# Patient Record
Sex: Male | Born: 1935 | Race: White | Hispanic: No | Marital: Married | Smoking: Former smoker
Health system: Southern US, Community
[De-identification: ages and names within clinical notes are randomized; demographics above are authoritative.]

## PROBLEM LIST (undated history)

## (undated) DIAGNOSIS — Z972 Presence of dental prosthetic device (complete) (partial): Secondary | ICD-10-CM

## (undated) DIAGNOSIS — N189 Chronic kidney disease, unspecified: Secondary | ICD-10-CM

## (undated) DIAGNOSIS — M810 Age-related osteoporosis without current pathological fracture: Secondary | ICD-10-CM

## (undated) DIAGNOSIS — J189 Pneumonia, unspecified organism: Secondary | ICD-10-CM

## (undated) DIAGNOSIS — D649 Anemia, unspecified: Secondary | ICD-10-CM

## (undated) DIAGNOSIS — E119 Type 2 diabetes mellitus without complications: Secondary | ICD-10-CM

## (undated) DIAGNOSIS — I2510 Atherosclerotic heart disease of native coronary artery without angina pectoris: Secondary | ICD-10-CM

## (undated) DIAGNOSIS — H409 Unspecified glaucoma: Secondary | ICD-10-CM

## (undated) DIAGNOSIS — G4730 Sleep apnea, unspecified: Secondary | ICD-10-CM

## (undated) DIAGNOSIS — I499 Cardiac arrhythmia, unspecified: Secondary | ICD-10-CM

## (undated) DIAGNOSIS — L570 Actinic keratosis: Secondary | ICD-10-CM

## (undated) DIAGNOSIS — E039 Hypothyroidism, unspecified: Secondary | ICD-10-CM

## (undated) HISTORY — DX: Actinic keratosis: L57.0

## (undated) HISTORY — PX: APPENDECTOMY: SHX54

## (undated) HISTORY — PX: BACK SURGERY: SHX140

## (undated) HISTORY — PX: HERNIA REPAIR: SHX51

---

## 2004-03-12 HISTORY — PX: CARDIAC CATHETERIZATION: SHX172

## 2004-04-06 ENCOUNTER — Emergency Department: Payer: Self-pay | Admitting: Emergency Medicine

## 2005-05-17 ENCOUNTER — Ambulatory Visit: Payer: Self-pay

## 2005-06-10 ENCOUNTER — Ambulatory Visit: Payer: Self-pay

## 2005-08-13 ENCOUNTER — Ambulatory Visit: Payer: Self-pay | Admitting: Gastroenterology

## 2005-08-29 ENCOUNTER — Ambulatory Visit: Payer: Self-pay | Admitting: Urology

## 2005-08-31 ENCOUNTER — Ambulatory Visit: Payer: Self-pay | Admitting: Internal Medicine

## 2005-09-14 ENCOUNTER — Ambulatory Visit: Payer: Self-pay | Admitting: Internal Medicine

## 2005-09-29 ENCOUNTER — Ambulatory Visit: Payer: Self-pay | Admitting: Internal Medicine

## 2006-02-26 ENCOUNTER — Ambulatory Visit: Payer: Self-pay | Admitting: Internal Medicine

## 2006-03-12 ENCOUNTER — Ambulatory Visit: Payer: Self-pay | Admitting: Internal Medicine

## 2006-04-12 ENCOUNTER — Ambulatory Visit: Payer: Self-pay | Admitting: Internal Medicine

## 2006-05-11 ENCOUNTER — Ambulatory Visit: Payer: Self-pay | Admitting: Internal Medicine

## 2006-07-11 ENCOUNTER — Ambulatory Visit: Payer: Self-pay | Admitting: Internal Medicine

## 2006-08-03 ENCOUNTER — Other Ambulatory Visit: Payer: Self-pay

## 2006-08-03 ENCOUNTER — Inpatient Hospital Stay: Payer: Self-pay | Admitting: Internal Medicine

## 2006-09-25 ENCOUNTER — Ambulatory Visit: Payer: Self-pay | Admitting: Ophthalmology

## 2007-09-01 ENCOUNTER — Ambulatory Visit: Payer: Self-pay | Admitting: Urology

## 2009-11-11 ENCOUNTER — Encounter: Admission: RE | Admit: 2009-11-11 | Discharge: 2009-11-11 | Payer: Self-pay | Admitting: Orthopedic Surgery

## 2011-01-16 ENCOUNTER — Ambulatory Visit: Payer: Self-pay | Admitting: Gastroenterology

## 2011-06-14 ENCOUNTER — Ambulatory Visit: Payer: Self-pay

## 2013-05-25 ENCOUNTER — Ambulatory Visit: Payer: Self-pay

## 2013-05-25 LAB — COMPREHENSIVE METABOLIC PANEL
ALK PHOS: 72 U/L
Albumin: 3.9 g/dL (ref 3.4–5.0)
Anion Gap: 10 (ref 7–16)
BUN: 21 mg/dL — AB (ref 7–18)
Bilirubin,Total: 0.6 mg/dL (ref 0.2–1.0)
CALCIUM: 9.5 mg/dL (ref 8.5–10.1)
CO2: 25 mmol/L (ref 21–32)
Chloride: 99 mmol/L (ref 98–107)
Creatinine: 1.51 mg/dL — ABNORMAL HIGH (ref 0.60–1.30)
EGFR (Non-African Amer.): 43 — ABNORMAL LOW
GFR CALC AF AMER: 50 — AB
GLUCOSE: 388 mg/dL — AB (ref 65–99)
Osmolality: 287 (ref 275–301)
POTASSIUM: 5.5 mmol/L — AB (ref 3.5–5.1)
SGOT(AST): 20 U/L (ref 15–37)
SGPT (ALT): 20 U/L (ref 12–78)
Sodium: 134 mmol/L — ABNORMAL LOW (ref 136–145)
TOTAL PROTEIN: 7.2 g/dL (ref 6.4–8.2)

## 2013-05-25 LAB — CBC WITH DIFFERENTIAL/PLATELET
BASOS PCT: 0.4 %
Basophil #: 0 10*3/uL (ref 0.0–0.1)
EOS ABS: 0 10*3/uL (ref 0.0–0.7)
Eosinophil %: 0.1 %
HCT: 41.5 % (ref 40.0–52.0)
HGB: 13.8 g/dL (ref 13.0–18.0)
LYMPHS ABS: 1.1 10*3/uL (ref 1.0–3.6)
LYMPHS PCT: 12.5 %
MCH: 30.4 pg (ref 26.0–34.0)
MCHC: 33.3 g/dL (ref 32.0–36.0)
MCV: 91 fL (ref 80–100)
MONO ABS: 0.5 x10 3/mm (ref 0.2–1.0)
Monocyte %: 5.7 %
Neutrophil #: 7.3 10*3/uL — ABNORMAL HIGH (ref 1.4–6.5)
Neutrophil %: 81.3 %
PLATELETS: 205 10*3/uL (ref 150–440)
RBC: 4.54 10*6/uL (ref 4.40–5.90)
RDW: 13.9 % (ref 11.5–14.5)
WBC: 9 10*3/uL (ref 3.8–10.6)

## 2016-12-11 ENCOUNTER — Encounter: Payer: Self-pay | Admitting: *Deleted

## 2016-12-17 NOTE — Discharge Instructions (Signed)

## 2016-12-19 ENCOUNTER — Encounter
Admission: RE | Disposition: A | Discharge status: Home / Self Care | Payer: Self-pay | Source: Ambulatory Visit | Attending: Ophthalmology

## 2016-12-19 ENCOUNTER — Ambulatory Visit
Admission: RE | Admit: 2016-12-19 | Discharge: 2016-12-19 | Disposition: A | Discharge status: Home / Self Care | Payer: Medicare PPO | Source: Ambulatory Visit | Attending: Ophthalmology | Admitting: Ophthalmology

## 2016-12-19 ENCOUNTER — Ambulatory Visit: Payer: Medicare PPO | Admitting: Anesthesiology

## 2016-12-19 DIAGNOSIS — G4730 Sleep apnea, unspecified: Secondary | ICD-10-CM | POA: Insufficient documentation

## 2016-12-19 DIAGNOSIS — Z955 Presence of coronary angioplasty implant and graft: Secondary | ICD-10-CM | POA: Insufficient documentation

## 2016-12-19 DIAGNOSIS — E1136 Type 2 diabetes mellitus with diabetic cataract: Secondary | ICD-10-CM | POA: Diagnosis not present

## 2016-12-19 DIAGNOSIS — H2512 Age-related nuclear cataract, left eye: Secondary | ICD-10-CM | POA: Insufficient documentation

## 2016-12-19 DIAGNOSIS — E079 Disorder of thyroid, unspecified: Secondary | ICD-10-CM | POA: Diagnosis not present

## 2016-12-19 DIAGNOSIS — H401110 Primary open-angle glaucoma, right eye, stage unspecified: Secondary | ICD-10-CM | POA: Diagnosis not present

## 2016-12-19 DIAGNOSIS — Z87891 Personal history of nicotine dependence: Secondary | ICD-10-CM | POA: Diagnosis not present

## 2016-12-19 DIAGNOSIS — D649 Anemia, unspecified: Secondary | ICD-10-CM | POA: Diagnosis not present

## 2016-12-19 DIAGNOSIS — Z9849 Cataract extraction status, unspecified eye: Secondary | ICD-10-CM | POA: Diagnosis not present

## 2016-12-19 HISTORY — DX: Presence of dental prosthetic device (complete) (partial): Z97.2

## 2016-12-19 HISTORY — DX: Age-related osteoporosis without current pathological fracture: M81.0

## 2016-12-19 HISTORY — DX: Anemia, unspecified: D64.9

## 2016-12-19 HISTORY — DX: Sleep apnea, unspecified: G47.30

## 2016-12-19 HISTORY — DX: Chronic kidney disease, unspecified: N18.9

## 2016-12-19 HISTORY — DX: Type 2 diabetes mellitus without complications: E11.9

## 2016-12-19 HISTORY — DX: Hypothyroidism, unspecified: E03.9

## 2016-12-19 HISTORY — PX: CATARACT EXTRACTION W/PHACO: SHX586

## 2016-12-19 HISTORY — PX: TRABECULECTOMY: SHX107

## 2016-12-19 LAB — GLUCOSE, CAPILLARY
Glucose-Capillary: 102 mg/dL — ABNORMAL HIGH (ref 65–99)
Glucose-Capillary: 125 mg/dL — ABNORMAL HIGH (ref 65–99)

## 2016-12-19 SURGERY — PHACOEMULSIFICATION, CATARACT, WITH IOL INSERTION
Anesthesia: Monitor Anesthesia Care | Laterality: Right | Wound class: Clean

## 2016-12-19 MED ORDER — LACTATED RINGERS IV SOLN
1000.0000 mL | INTRAVENOUS | Status: DC
Start: 2016-12-19 — End: 2016-12-19

## 2016-12-19 MED ORDER — MOXIFLOXACIN HCL 0.5 % OP SOLN
1.0000 [drp] | OPHTHALMIC | Status: DC | PRN
  Administered 2016-12-19 (×3): 1 [drp] via OPHTHALMIC

## 2016-12-19 MED ORDER — NA HYALUR & NA CHOND-NA HYALUR 0.4-0.35 ML IO KIT
PACK | INTRAOCULAR | Status: DC | PRN
Start: 2016-12-19 — End: 2016-12-19
  Administered 2016-12-19: 1 mL via INTRAOCULAR

## 2016-12-19 MED ORDER — LIDOCAINE HCL (PF) 4 % IJ SOLN
INTRAMUSCULAR | Status: DC | PRN
  Administered 2016-12-19: 5 mL via OPHTHALMIC

## 2016-12-19 MED ORDER — LIDOCAINE HCL (PF) 2 % IJ SOLN
INTRAOCULAR | Status: DC | PRN
  Administered 2016-12-19: 09:00:00 via INTRAMUSCULAR

## 2016-12-19 MED ORDER — ARMC OPHTHALMIC DILATING DROPS
1.0000 "application " | OPHTHALMIC | Status: DC | PRN
  Administered 2016-12-19 (×3): 1 via OPHTHALMIC

## 2016-12-19 MED ORDER — MITOMYCIN 0.2 MG OP KIT
PACK | OPHTHALMIC | Status: DC | PRN
  Administered 2016-12-19: .4 mL via OPHTHALMIC

## 2016-12-19 MED ORDER — BSS IO SOLN
INTRAOCULAR | Status: DC | PRN
  Administered 2016-12-19: 30 mL

## 2016-12-19 MED ORDER — MIDAZOLAM HCL 2 MG/2ML IJ SOLN
INTRAMUSCULAR | Status: DC | PRN
  Administered 2016-12-19: 1 mg via INTRAVENOUS

## 2016-12-19 MED ORDER — ALFENTANIL 500 MCG/ML IJ INJ
INJECTION | INTRAVENOUS | Status: DC | PRN
  Administered 2016-12-19: 400 ug via INTRAVENOUS

## 2016-12-19 MED ORDER — NEOMYCIN-POLYMYXIN-DEXAMETH 3.5-10000-0.1 OP OINT
TOPICAL_OINTMENT | OPHTHALMIC | Status: DC | PRN
Start: 2016-12-19 — End: 2016-12-19
  Administered 2016-12-19: 1 via OPHTHALMIC

## 2016-12-19 SURGICAL SUPPLY — 35 items
BANDAGE EYE OVAL (MISCELLANEOUS) ×6 IMPLANT
BLADE MINI RND TIP GREEN BEAV (BLADE) ×3 IMPLANT
CANNULA ANT/CHMB 27GA (MISCELLANEOUS) ×3 IMPLANT
CARTRIDGE ABBOTT (MISCELLANEOUS) IMPLANT
CORD BIP STRL DISP 12FT (MISCELLANEOUS) ×3 IMPLANT
CUP MEDICINE 2OZ PLAST GRAD ST (MISCELLANEOUS) ×3 IMPLANT
GLOVE BIO SURGEON STRL SZ7.5 (GLOVE) ×3 IMPLANT
GLOVE SURG LX 7.5 STRW (GLOVE) ×2
GLOVE SURG LX STRL 7.5 STRW (GLOVE) ×1 IMPLANT
GLOVE SURG TRIUMPH 8.0 PF LTX (GLOVE) ×3 IMPLANT
GOWN STRL REUS W/ TWL LRG LVL3 (GOWN DISPOSABLE) ×2 IMPLANT
GOWN STRL REUS W/TWL LRG LVL3 (GOWN DISPOSABLE) ×4
LENS IOL ACRYSOF IQ 20.0 (Intraocular Lens) ×3 IMPLANT
MARKER SKIN DUAL TIP RULER LAB (MISCELLANEOUS) ×3 IMPLANT
NDL RETROBULBAR .5 NSTRL (NEEDLE) ×3 IMPLANT
NEEDLE FILTER BLUNT 18X 1/2SAF (NEEDLE) ×2
NEEDLE FILTER BLUNT 18X1 1/2 (NEEDLE) ×1 IMPLANT
NEEDLE HYPO 26X3/8 (NEEDLE) ×3 IMPLANT
PACK CATARACT BRASINGTON (MISCELLANEOUS) ×3 IMPLANT
PACK EYE AFTER SURG (MISCELLANEOUS) ×3 IMPLANT
PACK OPTHALMIC (MISCELLANEOUS) ×3 IMPLANT
PROTECTOR LASIK FLAP (MISCELLANEOUS) ×3 IMPLANT
RING MALYGIN 7.0 (MISCELLANEOUS) ×3 IMPLANT
SHUNT EXPRESS GLAUCOMA MINI (Shunt) ×3 IMPLANT
SPONGE SURG I SPEAR (MISCELLANEOUS) ×6 IMPLANT
SUT ETHILON 10-0 CS-B-6CS-B-6 (SUTURE) ×3
SUT VICRYL  9 0 (SUTURE) ×2
SUT VICRYL 9 0 (SUTURE) ×1 IMPLANT
SUTURE EHLN 10-0 CS-B-6CS-B-6 (SUTURE) ×1 IMPLANT
SYR 3ML LL SCALE MARK (SYRINGE) ×3 IMPLANT
SYR TB 1ML LUER SLIP (SYRINGE) ×3 IMPLANT
SYRINGE 10CC LL (SYRINGE) ×3 IMPLANT
WATER STERILE IRR 250ML POUR (IV SOLUTION) ×3 IMPLANT
WATER STERILE IRR 500ML POUR (IV SOLUTION) ×3 IMPLANT
WIPE NON LINTING 3.25X3.25 (MISCELLANEOUS) ×3 IMPLANT

## 2016-12-19 NOTE — Transfer of Care (Signed)
Immediate Anesthesia Transfer of Care Note  Patient: Logan Strong  Procedure(s) Performed: CATARACT EXTRACTION PHACO AND INTRAOCULAR LENS PLACEMENT (IOC) RIGHT DIABETIC (Right ) TRABECULECTOMY WITH [facility] AND EXPRESS SHUNT (Right )  Patient Location: PACU  Anesthesia Type: MAC  Level of Consciousness: awake, alert  and patient cooperative  Airway and Oxygen Therapy: Patient Spontanous Breathing and Patient connected to supplemental oxygen  Post-op Assessment: Post-op Vital signs reviewed, Patient's Cardiovascular Status Stable, Respiratory Function Stable, Patent Airway and No signs of Nausea or vomiting  Post-op Vital Signs: Reviewed and stable  Complications: No apparent anesthesia complications

## 2016-12-19 NOTE — Anesthesia Procedure Notes (Signed)
Procedure Name: MAC Performed by: Kalen Neidert Pre-anesthesia Checklist: Patient identified, Emergency Drugs available, Suction available, Timeout performed and Patient being monitored Patient Re-evaluated:Patient Re-evaluated prior to inductionOxygen Delivery Method: Nasal cannula Placement Confirmation: positive ETCO2       

## 2016-12-19 NOTE — Anesthesia Postprocedure Evaluation (Signed)
Anesthesia Post Note  Patient: Logan Strong  Procedure(s) Performed: CATARACT EXTRACTION PHACO AND INTRAOCULAR LENS PLACEMENT (IOC) RIGHT DIABETIC (Right ) TRABECULECTOMY WITH [facility] AND EXPRESS SHUNT (Right )  Patient location during evaluation: PACU Anesthesia Type: MAC Level of consciousness: awake and alert Pain management: pain level controlled Vital Signs Assessment: post-procedure vital signs reviewed and stable Respiratory status: spontaneous breathing, nonlabored ventilation, respiratory function stable and patient connected to nasal cannula oxygen Cardiovascular status: stable and blood pressure returned to baseline Postop Assessment: no apparent nausea or vomiting Anesthetic complications: no    DANIEL D KOVACS

## 2016-12-19 NOTE — Anesthesia Preprocedure Evaluation (Signed)
Anesthesia Evaluation  Patient identified by MRN, date of birth, ID band Patient awake    Reviewed: Allergy & Precautions, H&P , NPO status , Patient's Chart, lab work & pertinent test results, reviewed documented beta blocker date and time   Airway Mallampati: II  TM Distance: >3 FB Neck ROM: full    Dental no notable dental hx.    Pulmonary sleep apnea and Continuous Positive Airway Pressure Ventilation , former smoker,    Pulmonary exam normal breath sounds clear to auscultation       Cardiovascular Exercise Tolerance: Good negative cardio ROS   Rhythm:regular Rate:Normal     Neuro/Psych negative neurological ROS  negative psych ROS   GI/Hepatic negative GI ROS, Neg liver ROS,   Endo/Other  diabetes, Type 2, Insulin DependentHypothyroidism   Renal/GU CRFRenal disease  negative genitourinary   Musculoskeletal   Abdominal   Peds  Hematology negative hematology ROS (+) anemia ,   Anesthesia Other Findings   Reproductive/Obstetrics negative OB ROS                             Anesthesia Physical Anesthesia Plan  ASA: III  Anesthesia Plan: MAC   Post-op Pain Management:    Induction:   PONV Risk Score and Plan:   Airway Management Planned:   Additional Equipment:   Intra-op Plan:   Post-operative Plan:   Informed Consent: I have reviewed the patients History and Physical, chart, labs and discussed the procedure including the risks, benefits and alternatives for the proposed anesthesia with the patient or authorized representative who has indicated his/her understanding and acceptance.     Plan Discussed with: CRNA  Anesthesia Plan Comments:         Anesthesia Quick Evaluation

## 2016-12-19 NOTE — Op Note (Signed)
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  X52.8413   Nuclear sclerotic cataract left eye. 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     Nuclear sclerotic cataract left eye. H25.11    PROCEDURE PERFORMED: Trabeculectomy with mitomycin C and placement of Express shunt right eye. Phacoemusification with posterior chamber intraocular lens placement of the right eye     IMPLANT:   Implant Name Type Inv. Item Serial No. Manufacturer Lot No. LRB No. Used  SHUNT EXPRESS GLAUCOMA MINI - [id number] Shunt SHUNT EXPRESS GLAUCOMA MINI [id number] ALCON [id number] Right 1  LENS IOL ACRYSOF IQ 20.0 - [id number] Intraocular Lens LENS IOL ACRYSOF IQ 20.0 [id number] ALCON   Right 1    ULTRASOUND TIME: 16  % of 1 minutes 24 seconds, CDE 13.8  SURGEON:  Deirdre Evener, MD   ANESTHESIA: Retrobulbar block of Xylocaine and bupivacaine administered under intravenous sedation plus 1% preservative-free intracameral lidocaine.  ESTIMATED BLOOD LOSS: Less than 1 mL.   COMPLICATIONS: None.   DESCRIPTION OF PROCEDURE: The patient was identified in the holding room and transported to the operative suite and placed in the supine position underneath the operating microscope. The right eye was identified as the operative eye and a retrobulbar block and lid block of Xylocaine and bupivacaine were administered under intravenous sedation. It was then prepped and draped in the usual sterile ophthalmic fashion.   A 1 millimeter clear-corneal paracentesis was made at the 12:00 position.  0.5 ml of preservative-free 1% lidocaine was injected into the anterior chamber.  The anterior chamber was filled with Viscoat viscoelastic.  A 2.4 millimeter keratome was used to make a near-clear corneal incision at the 9:00 position.  .  A curvilinear capsulorrhexis was made with a cystotome and  capsulorrhexis forceps.  Balanced salt solution was used to hydrodissect and hydrodelineate the nucleus.   Phacoemulsification was then used in stop and chop fashion to remove the lens nucleus and epinucleus.  The remaining cortex was then removed using the irrigation and aspiration handpiece. Provisc was then placed into the capsular bag to distend it for lens placement.  A lens was then injected into the capsular bag.  The remaining viscoelastic was aspirated.   Wounds were hydrated with balanced salt solution.  The anterior chamber was inflated to a physiologic pressure with balanced salt solution.  No wound leaks were noted.  A 10-0 nylon suture was placed through the 2.4 mm incision.   Mitomycin 0.04% mixed with 2% lidocaine was injected subconjunctivally.  A conjunctival peritomy was made from the 12 o'clock to the 2 o'clock position with Westcott scissors approximately 2 mm posterior to the limbus. Hemostasis was achieved with Wet-Field cautery. This area was copiously irrigated with balanced salt solution.  Careful dissection of the tenons and conjunctiva were done using Westcott scissors.  A partial-thickness trapezoidal-shaped scleral flap was made at the 1 o'clock position. This was dissected forward to clear cornea. A 26-gauge needle was then used to enter the anterior chamber parallel to the iris underneath the anterior portion of the scleral flap. An Express shunt version P50 was then placed through the opening into the anterior chamber. This was noted to be parallel with the iris without corneal or iris touch. The flap was sutured initially with two posterior 10-0 nylon sutures. The anterior chamber was filled with balanced salt solution to remove the remaining Provisc. An additional 3 10-0 nylon  sutures were placed on the sides of the flap. There was adequate but not excess spontaneous flow from the flap. The conjunctiva was then closed with running 9-0 Vicryl suture.   The paracentesis  incision was hydrated with balanced salt solution to ensure a tight wound without wound leak. Cefuroxime 0.1 ml of a /ml solution was injected into the anterior chamber for a dose of 1 mg of intracameral antibiotic at the completion of the case. The eye was inflated above physiologic pressure. Spontaneous bleb formation was noted. There was no conjunctival wound leak with bleb formation. Topical  erythromycin ointment was applied to the eye. The eye was patched and shielded. The patient was taken to the recovery room in stable condition.  Conor Lata 12/19/2016, 9:04 AM

## 2016-12-19 NOTE — H&P (Signed)
The History and Physical notes are on paper, have been signed, and are to be scanned. The patient remains stable and unchanged from the H&P.   Previous H&P reviewed, patient examined, and there are no changes.  Logan Strong 12/19/2016 7:40 AM

## 2016-12-20 ENCOUNTER — Encounter: Payer: Self-pay | Admitting: Ophthalmology

## 2017-03-22 DIAGNOSIS — E113293 Type 2 diabetes mellitus with mild nonproliferative diabetic retinopathy without macular edema, bilateral: Secondary | ICD-10-CM | POA: Diagnosis not present

## 2017-03-27 DIAGNOSIS — L570 Actinic keratosis: Secondary | ICD-10-CM | POA: Diagnosis not present

## 2017-03-27 DIAGNOSIS — L578 Other skin changes due to chronic exposure to nonionizing radiation: Secondary | ICD-10-CM | POA: Diagnosis not present

## 2017-03-27 DIAGNOSIS — L820 Inflamed seborrheic keratosis: Secondary | ICD-10-CM | POA: Diagnosis not present

## 2017-04-08 DIAGNOSIS — E103493 Type 1 diabetes mellitus with severe nonproliferative diabetic retinopathy without macular edema, bilateral: Secondary | ICD-10-CM | POA: Diagnosis not present

## 2017-04-08 DIAGNOSIS — E034 Atrophy of thyroid (acquired): Secondary | ICD-10-CM | POA: Diagnosis not present

## 2017-04-08 DIAGNOSIS — M810 Age-related osteoporosis without current pathological fracture: Secondary | ICD-10-CM | POA: Diagnosis not present

## 2017-04-08 DIAGNOSIS — Z9641 Presence of insulin pump (external) (internal): Secondary | ICD-10-CM | POA: Diagnosis not present

## 2017-04-08 DIAGNOSIS — D510 Vitamin B12 deficiency anemia due to intrinsic factor deficiency: Secondary | ICD-10-CM | POA: Diagnosis not present

## 2017-04-08 DIAGNOSIS — E10649 Type 1 diabetes mellitus with hypoglycemia without coma: Secondary | ICD-10-CM | POA: Diagnosis not present

## 2017-04-17 DIAGNOSIS — I10 Essential (primary) hypertension: Secondary | ICD-10-CM | POA: Diagnosis not present

## 2017-04-26 DIAGNOSIS — E7800 Pure hypercholesterolemia, unspecified: Secondary | ICD-10-CM | POA: Diagnosis not present

## 2017-04-26 DIAGNOSIS — N183 Chronic kidney disease, stage 3 (moderate): Secondary | ICD-10-CM | POA: Diagnosis not present

## 2017-04-26 DIAGNOSIS — I10 Essential (primary) hypertension: Secondary | ICD-10-CM | POA: Diagnosis not present

## 2017-04-26 DIAGNOSIS — E034 Atrophy of thyroid (acquired): Secondary | ICD-10-CM | POA: Diagnosis not present

## 2017-05-15 DIAGNOSIS — H409 Unspecified glaucoma: Secondary | ICD-10-CM | POA: Diagnosis not present

## 2017-05-15 DIAGNOSIS — Z6821 Body mass index (BMI) 21.0-21.9, adult: Secondary | ICD-10-CM | POA: Diagnosis not present

## 2017-05-15 DIAGNOSIS — E039 Hypothyroidism, unspecified: Secondary | ICD-10-CM | POA: Diagnosis not present

## 2017-05-15 DIAGNOSIS — M810 Age-related osteoporosis without current pathological fracture: Secondary | ICD-10-CM | POA: Diagnosis not present

## 2017-05-15 DIAGNOSIS — E785 Hyperlipidemia, unspecified: Secondary | ICD-10-CM | POA: Diagnosis not present

## 2017-05-15 DIAGNOSIS — I252 Old myocardial infarction: Secondary | ICD-10-CM | POA: Diagnosis not present

## 2017-05-15 DIAGNOSIS — E1022 Type 1 diabetes mellitus with diabetic chronic kidney disease: Secondary | ICD-10-CM | POA: Diagnosis not present

## 2017-05-15 DIAGNOSIS — N189 Chronic kidney disease, unspecified: Secondary | ICD-10-CM | POA: Diagnosis not present

## 2017-05-15 DIAGNOSIS — I129 Hypertensive chronic kidney disease with stage 1 through stage 4 chronic kidney disease, or unspecified chronic kidney disease: Secondary | ICD-10-CM | POA: Diagnosis not present

## 2017-05-31 DIAGNOSIS — Z4681 Encounter for fitting and adjustment of insulin pump: Secondary | ICD-10-CM | POA: Diagnosis not present

## 2017-05-31 DIAGNOSIS — Z9641 Presence of insulin pump (external) (internal): Secondary | ICD-10-CM | POA: Diagnosis not present

## 2017-05-31 DIAGNOSIS — Z7189 Other specified counseling: Secondary | ICD-10-CM | POA: Diagnosis not present

## 2017-05-31 DIAGNOSIS — E108 Type 1 diabetes mellitus with unspecified complications: Secondary | ICD-10-CM | POA: Diagnosis not present

## 2017-06-05 DIAGNOSIS — H6123 Impacted cerumen, bilateral: Secondary | ICD-10-CM | POA: Diagnosis not present

## 2017-06-05 DIAGNOSIS — H93299 Other abnormal auditory perceptions, unspecified ear: Secondary | ICD-10-CM | POA: Diagnosis not present

## 2017-06-07 ENCOUNTER — Other Ambulatory Visit: Payer: Self-pay

## 2017-06-07 ENCOUNTER — Inpatient Hospital Stay
Admission: EM | Admit: 2017-06-07 | Discharge: 2017-06-08 | DRG: 194 | Disposition: A | Discharge status: Home / Self Care | Payer: Medicare Other | Attending: Internal Medicine | Admitting: Internal Medicine

## 2017-06-07 ENCOUNTER — Encounter: Payer: Self-pay | Admitting: Emergency Medicine

## 2017-06-07 ENCOUNTER — Emergency Department: Payer: Medicare Other

## 2017-06-07 DIAGNOSIS — N183 Chronic kidney disease, stage 3 (moderate): Secondary | ICD-10-CM | POA: Diagnosis present

## 2017-06-07 DIAGNOSIS — M810 Age-related osteoporosis without current pathological fracture: Secondary | ICD-10-CM | POA: Diagnosis not present

## 2017-06-07 DIAGNOSIS — Z79890 Hormone replacement therapy (postmenopausal): Secondary | ICD-10-CM | POA: Diagnosis not present

## 2017-06-07 DIAGNOSIS — Z87891 Personal history of nicotine dependence: Secondary | ICD-10-CM

## 2017-06-07 DIAGNOSIS — Z794 Long term (current) use of insulin: Secondary | ICD-10-CM

## 2017-06-07 DIAGNOSIS — Z9641 Presence of insulin pump (external) (internal): Secondary | ICD-10-CM | POA: Diagnosis not present

## 2017-06-07 DIAGNOSIS — J181 Lobar pneumonia, unspecified organism: Principal | ICD-10-CM | POA: Diagnosis present

## 2017-06-07 DIAGNOSIS — Z7982 Long term (current) use of aspirin: Secondary | ICD-10-CM

## 2017-06-07 DIAGNOSIS — E785 Hyperlipidemia, unspecified: Secondary | ICD-10-CM | POA: Diagnosis not present

## 2017-06-07 DIAGNOSIS — R5383 Other fatigue: Secondary | ICD-10-CM | POA: Diagnosis not present

## 2017-06-07 DIAGNOSIS — E1122 Type 2 diabetes mellitus with diabetic chronic kidney disease: Secondary | ICD-10-CM | POA: Diagnosis not present

## 2017-06-07 DIAGNOSIS — E039 Hypothyroidism, unspecified: Secondary | ICD-10-CM | POA: Diagnosis present

## 2017-06-07 DIAGNOSIS — G4730 Sleep apnea, unspecified: Secondary | ICD-10-CM | POA: Diagnosis present

## 2017-06-07 DIAGNOSIS — N179 Acute kidney failure, unspecified: Secondary | ICD-10-CM | POA: Diagnosis present

## 2017-06-07 DIAGNOSIS — Z79899 Other long term (current) drug therapy: Secondary | ICD-10-CM | POA: Diagnosis not present

## 2017-06-07 DIAGNOSIS — I10 Essential (primary) hypertension: Secondary | ICD-10-CM | POA: Diagnosis not present

## 2017-06-07 DIAGNOSIS — J189 Pneumonia, unspecified organism: Secondary | ICD-10-CM | POA: Diagnosis present

## 2017-06-07 DIAGNOSIS — R079 Chest pain, unspecified: Secondary | ICD-10-CM | POA: Diagnosis not present

## 2017-06-07 DIAGNOSIS — E119 Type 2 diabetes mellitus without complications: Secondary | ICD-10-CM | POA: Diagnosis not present

## 2017-06-07 DIAGNOSIS — R0789 Other chest pain: Secondary | ICD-10-CM | POA: Diagnosis not present

## 2017-06-07 DIAGNOSIS — I129 Hypertensive chronic kidney disease with stage 1 through stage 4 chronic kidney disease, or unspecified chronic kidney disease: Secondary | ICD-10-CM | POA: Diagnosis not present

## 2017-06-07 LAB — BASIC METABOLIC PANEL
ANION GAP: 13 (ref 5–15)
BUN: 43 mg/dL — ABNORMAL HIGH (ref 6–20)
CHLORIDE: 102 mmol/L (ref 101–111)
CO2: 17 mmol/L — AB (ref 22–32)
Calcium: 8.7 mg/dL — ABNORMAL LOW (ref 8.9–10.3)
Creatinine, Ser: 1.79 mg/dL — ABNORMAL HIGH (ref 0.61–1.24)
GFR calc non Af Amer: 33 mL/min — ABNORMAL LOW (ref >60)
GFR, EST AFRICAN AMERICAN: 39 mL/min — AB (ref >60)
Glucose, Bld: 374 mg/dL — ABNORMAL HIGH (ref 65–99)
POTASSIUM: 4.7 mmol/L (ref 3.5–5.1)
SODIUM: 132 mmol/L — AB (ref 135–145)

## 2017-06-07 LAB — CBC
HEMATOCRIT: 34.8 % — AB (ref 40.0–52.0)
HEMOGLOBIN: 11.7 g/dL — AB (ref 13.0–18.0)
MCH: 30.5 pg (ref 26.0–34.0)
MCHC: 33.6 g/dL (ref 32.0–36.0)
MCV: 90.7 fL (ref 80.0–100.0)
Platelets: 188 10*3/uL (ref 150–440)
RBC: 3.83 MIL/uL — AB (ref 4.40–5.90)
RDW: 14.3 % (ref 11.5–14.5)
WBC: 7.9 10*3/uL (ref 3.8–10.6)

## 2017-06-07 LAB — TROPONIN I: Troponin I: <0.03 ng/mL (ref <0.03)

## 2017-06-07 LAB — LACTIC ACID, PLASMA: Lactic Acid, Venous: 1.1 mmol/L (ref 0.5–1.9)

## 2017-06-07 LAB — GLUCOSE, CAPILLARY: GLUCOSE-CAPILLARY: 309 mg/dL — AB (ref 65–99)

## 2017-06-07 MED ORDER — DORZOLAMIDE HCL 2 % OP SOLN
1.0000 [drp] | Freq: Two times a day (BID) | OPHTHALMIC | Status: DC
  Filled 2017-06-07: qty 10

## 2017-06-07 MED ORDER — ACETAMINOPHEN 325 MG PO TABS: 650.0000 mg | ORAL_TABLET | Freq: Four times a day (QID) | ORAL | Status: DC | PRN

## 2017-06-07 MED ORDER — LEVOFLOXACIN IN D5W 750 MG/150ML IV SOLN: 750.0000 mg | INTRAVENOUS | Status: DC

## 2017-06-07 MED ORDER — SIMVASTATIN 20 MG PO TABS: 40.0000 mg | ORAL_TABLET | Freq: Every day | ORAL | Status: DC

## 2017-06-07 MED ORDER — ENOXAPARIN SODIUM 40 MG/0.4ML ~~LOC~~ SOLN
30.0000 mg | SUBCUTANEOUS | Status: DC
  Filled 2017-06-07: qty 0.4

## 2017-06-07 MED ORDER — BRIMONIDINE TARTRATE 0.2 % OP SOLN
1.0000 [drp] | Freq: Two times a day (BID) | OPHTHALMIC | Status: DC
  Administered 2017-06-08: 1 [drp] via OPHTHALMIC
  Filled 2017-06-07: qty 5

## 2017-06-07 MED ORDER — POLYETHYLENE GLYCOL 3350 17 G PO PACK: 17.0000 g | PACK | Freq: Every day | ORAL | Status: DC | PRN

## 2017-06-07 MED ORDER — INSULIN ASPART 100 UNIT/ML ~~LOC~~ SOLN
0.0000 [IU] | Freq: Three times a day (TID) | SUBCUTANEOUS | Status: DC
Start: 2017-06-08 — End: 2017-06-08

## 2017-06-07 MED ORDER — INSULIN ASPART 100 UNIT/ML ~~LOC~~ SOLN: 0.0000 [IU] | Freq: Every day | SUBCUTANEOUS | Status: DC

## 2017-06-07 MED ORDER — SODIUM CHLORIDE 0.9 % IV SOLN
INTRAVENOUS | Status: AC
  Administered 2017-06-08: 01:00:00 via INTRAVENOUS

## 2017-06-07 MED ORDER — ONDANSETRON HCL 4 MG PO TABS: 4.0000 mg | ORAL_TABLET | Freq: Four times a day (QID) | ORAL | Status: DC | PRN

## 2017-06-07 MED ORDER — INSULIN PUMP
Freq: Three times a day (TID) | SUBCUTANEOUS | Status: DC
  Administered 2017-06-08 (×2): via SUBCUTANEOUS
  Filled 2017-06-07: qty 1

## 2017-06-07 MED ORDER — LISINOPRIL 5 MG PO TABS
5.0000 mg | ORAL_TABLET | Freq: Every day | ORAL | Status: DC
  Administered 2017-06-08: 09:00:00 5 mg via ORAL
  Filled 2017-06-07: qty 1

## 2017-06-07 MED ORDER — CEFTRIAXONE SODIUM 1 G IJ SOLR
1.0000 g | Freq: Once | INTRAMUSCULAR | Status: AC
  Administered 2017-06-07: 1 g via INTRAVENOUS
  Filled 2017-06-07: qty 10

## 2017-06-07 MED ORDER — TIMOLOL MALEATE 0.5 % OP SOLN
1.0000 [drp] | Freq: Two times a day (BID) | OPHTHALMIC | Status: DC
  Filled 2017-06-07: qty 5

## 2017-06-07 MED ORDER — LEVOTHYROXINE SODIUM 50 MCG PO TABS
50.0000 ug | ORAL_TABLET | Freq: Every day | ORAL | Status: DC
  Administered 2017-06-08: 50 ug via ORAL
  Filled 2017-06-07: qty 1

## 2017-06-07 MED ORDER — SODIUM CHLORIDE 0.9 % IV SOLN
500.0000 mg | Freq: Once | INTRAVENOUS | Status: AC
  Administered 2017-06-07: 500 mg via INTRAVENOUS
  Filled 2017-06-07: qty 500

## 2017-06-07 MED ORDER — ALBUTEROL SULFATE (2.5 MG/3ML) 0.083% IN NEBU: 2.5000 mg | INHALATION_SOLUTION | RESPIRATORY_TRACT | Status: DC | PRN

## 2017-06-07 MED ORDER — LATANOPROST 0.005 % OP SOLN
1.0000 [drp] | Freq: Every day | OPHTHALMIC | Status: DC
  Administered 2017-06-08: 01:00:00 1 [drp] via OPHTHALMIC
  Filled 2017-06-07: qty 2.5

## 2017-06-07 MED ORDER — ONDANSETRON HCL 4 MG/2ML IJ SOLN: 4.0000 mg | Freq: Four times a day (QID) | INTRAMUSCULAR | Status: DC | PRN

## 2017-06-07 MED ORDER — ACETAMINOPHEN 650 MG RE SUPP: 650.0000 mg | Freq: Four times a day (QID) | RECTAL | Status: DC | PRN

## 2017-06-07 MED ORDER — TIMOLOL HEMIHYDRATE 0.5 % OP SOLN: 1.0000 [drp] | Freq: Two times a day (BID) | OPHTHALMIC | Status: DC

## 2017-06-07 MED ORDER — ASPIRIN EC 81 MG PO TBEC
81.0000 mg | DELAYED_RELEASE_TABLET | Freq: Every day | ORAL | Status: DC
  Administered 2017-06-08: 09:00:00 81 mg via ORAL
  Filled 2017-06-07: qty 1

## 2017-06-07 NOTE — ED Triage Notes (Signed)
Patient with complaint of central chest pain that woke him from his sleep Wednesday night. Patient also with complaint of shortness of breath and weakness.

## 2017-06-07 NOTE — ED Provider Notes (Signed)
[facility] Emergency Department Provider Note   ____________________________________________    I have reviewed the triage vital signs and the nursing notes.   HISTORY  Chief Complaint Chest Pain     HPI Logan Strong is a 84 y.o. male who presents with complaints of shortness of breath and some chest discomfort.  Patient reports last night when he went to bed he noticed that he was having some difficulty breathing, he describes it as trying to breathe through a sheet.  Describes mild chest aching as well.  Denies fevers or chills.  No cough.  No recent travel.  No calf pain or swelling.  He has never had this before.  Does have a history of diabetes.  No nausea vomiting or diaphoresis.  Has not taken anything for this.   Past Medical History:  Diagnosis Date  . Anemia   . Chronic kidney disease    "functioning at 50%"  . Diabetes mellitus, type 2 (HCC)   . Hypothyroidism   . Osteoporosis   . Sleep apnea    could not tolerate CPAP  . Wears dentures    partial upper and lower    There are no active problems to display for this patient.   Past Surgical History:  Procedure Laterality Date  . CARDIAC CATHETERIZATION  2006   stent x1  . CATARACT EXTRACTION W/PHACO Right 12/19/2016   Procedure: CATARACT EXTRACTION PHACO AND INTRAOCULAR LENS PLACEMENT (IOC) RIGHT DIABETIC;  Surgeon: Lockie Mola, MD;  Location[facility];  Service: Ophthalmology;  Laterality: Right;  . TRABECULECTOMY Right 12/19/2016   Procedure: TRABECULECTOMY WITH [facility] AND EXPRESS SHUNT;  Surgeon: Lockie Mola, MD;  Location: [facility];  Service: Ophthalmology;  Laterality: Right;  Diabetic - insulin pump sleep apnea    Prior to Admission medications   Medication Sig Start Date End Date Taking? Authorizing Provider  aspirin 81 MG tablet Take 81 mg by mouth daily.   Yes [provider]  brimonidine (ALPHAGAN) 0.2 % ophthalmic  solution Place into the right eye 2 (two) times daily.    Yes [provider]  CALCIUM CARBONATE-VITAMIN D PO Take by mouth daily.   Yes [provider]  ciclopirox (PENLAC) 8 % solution Apply topically at bedtime. Apply over nail and surrounding skin. Apply daily over previous coat. After seven (7) days, may remove with alcohol and continue cycle.   Yes [provider]  CYANOCOBALAMIN PO Take by mouth daily.   Yes [provider]  diphenhydramine-acetaminophen (TYLENOL PM) 25-500 MG TABS tablet Take 1 tablet by mouth at bedtime as needed.   Yes [provider]  dorzolamide (TRUSOPT) 2 % ophthalmic solution Place 1 drop into the right eye 2 (two) times daily.    Yes [provider]  insulin aspart (NOVOLOG) 100 UNIT/ML injection Inject into the skin. In pump.  Basil and bolus before each meal   Yes [provider]  insulin glargine (LANTUS) 100 UNIT/ML injection Inject into the skin as needed (use when off pump).   Yes [provider]  latanoprost (XALATAN) 0.005 % ophthalmic solution Place 1 drop into the right eye at bedtime.    Yes [provider]  levothyroxine (SYNTHROID, LEVOTHROID) 50 MCG tablet Take 50 mcg by mouth daily before breakfast.   Yes [provider]  lisinopril (PRINIVIL,ZESTRIL) 5 MG tablet Take 5 mg by mouth daily.    Yes [provider]  Omega-3 Fatty Acids (FISH OIL PO) Take  1 capsule by mouth daily.    Yes [provider]  permethrin (ELIMITE) 5 % cream Apply 1 application topically daily.   Yes [provider]  simvastatin (ZOCOR) 40 MG tablet Take 40 mg by mouth daily.   Yes [provider]  timolol (BETIMOL) 0.5 % ophthalmic solution Place 1 drop into both eyes 2 (two) times daily.   Yes [provider]  Acetaminophen (TYLENOL PO) Take by mouth at bedtime as needed.    [provider]  Netarsudil Dimesylate 0.02 % SOLN Apply to eye  daily.    [provider]     Allergies Patient has no known allergies.  No family history on file.  Social History Social History   Tobacco Use  . Smoking status: Former Smoker    Last attempt to quit: 1971    Years since quitting: 48.2  . Smokeless tobacco: Never Used  Substance Use Topics  . Alcohol use: Never    Frequency: Never  . Drug use: Never    Review of Systems  Constitutional: Feels fatigued Eyes: No visual changes.  ENT: No sore throat. Cardiovascular: As above Respiratory: As above Gastrointestinal: No abdominal pain.  No nausea, no vomiting.   Genitourinary: Negative for dysuria. Musculoskeletal: Negative for back pain. Skin: Negative for rash. Neurological: Negative for headaches   ____________________________________________   PHYSICAL EXAM:  VITAL SIGNS: ED Triage Vitals  Enc Vitals Group     BP 06/07/17 2027 (!) 118/42     Pulse Rate 06/07/17 2027 (!) 33     Resp 06/07/17 2027 18     Temp 06/07/17 2027 97.6 F (36.4 C)     Temp Source 06/07/17 2027 Oral     SpO2 06/07/17 2027 98 %     Weight 06/07/17 2028 57.6 kg (127 lb)     Height 06/07/17 2028 1.702 m (5\' 7" )     Head Circumference --      Peak Flow --      Pain Score 06/07/17 2028 1     Pain Loc --      Pain Edu? --      Excl. in GC? --     Constitutional: Alert and oriented. No acute distress. Pleasant and interactive Eyes: Conjunctivae are normal.   Nose: No congestion/rhinnorhea. Mouth/Throat: Mucous membranes are moist.    Cardiovascular: Normal rate, regular rhythm.  Systolic murmur good peripheral circulation. Respiratory: Normal respiratory effort.  No retractions.  Right lower lobe Rales Gastrointestinal: Soft and nontender. No distention.  No CVA tenderness. Genitourinary: deferred Musculoskeletal: .  Warm and well perfused Neurologic:  Normal speech and language. No gross focal neurologic deficits are appreciated.  Skin:  Skin is warm, dry and intact.  No rash noted. Psychiatric: Mood and affect are normal. Speech and behavior are normal.  ____________________________________________   LABS (all labs ordered are listed, but only abnormal results are displayed)  Labs Reviewed  BASIC METABOLIC PANEL - Abnormal; Notable for the following components:      Result Value   Sodium 132 (*)    CO2 17 (*)    Glucose, Bld 374 (*)    BUN 43 (*)    Creatinine, Ser 1.79 (*)    Calcium 8.7 (*)    GFR calc non Af Amer 33 (*)    GFR calc Af Amer 39 (*)    All other components within normal limits  CBC - Abnormal; Notable for the following components:   RBC 3.83 (*)  Hemoglobin 11.7 (*)    HCT 34.8 (*)    All other components within normal limits  CULTURE, BLOOD (ROUTINE X 2)  CULTURE, BLOOD (ROUTINE X 2)  TROPONIN I  LACTIC ACID, PLASMA  LACTIC ACID, PLASMA   ____________________________________________  EKG  ED ECG REPORT I, Jene Everyobert Tyana Butzer, the attending physician, personally viewed and interpreted this ECG.  Date: 06/07/2017  Rhythm: Bradycardia QRS Axis: normal Intervals: normal ST/T Wave abnormalities: normal Narrative Interpretation: no evidence of acute ischemia  ____________________________________________  RADIOLOGY  Chest x-ray consistent with right upper lobe infiltrate ____________________________________________   PROCEDURES  Procedure(s) performed: No  Procedures   Critical Care performed: No ____________________________________________   INITIAL IMPRESSION / ASSESSMENT AND PLAN / ED COURSE  Pertinent labs & imaging results that were available during my care of the patient were reviewed by me and considered in my medical decision making (see chart for details).  Patient presents with shortness of breath mild chest discomfort.  He is cachectic upon arrival, afebrile, 98% on room air, normal blood pressure.  Lab work is significant for elevated BUN and creatinine, white blood cell count is normal  however clear infiltrate on chest x-ray, given history of diabetes, age, tachypnea will admit for community-acquired pneumonia    ____________________________________________   FINAL CLINICAL IMPRESSION(S) / ED DIAGNOSES  Final diagnoses:  Community acquired pneumonia of right middle lobe of lung (HCC)        Note:  This document was prepared using Conservation officer, historic buildingsDragon voice recognition software and may include unintentional dictation errors.    Jene EveryKinner, [place] Hack[place], MD 06/07/17 2235

## 2017-06-07 NOTE — H&P (Signed)
[employer] [employer] - [place] [facility] [place] [facility]   PATIENT NAME: Logan Strong    MR#:  [id number]  DATE OF BIRTH:  1932-02-03  DATE OF ADMISSION:  06/07/2017  PRIMARY CARE PHYSICIAN: Mosetta PigeonSingh, Harmeet, MD   REQUESTING/REFERRING PHYSICIAN: Dr. Cyril LoosenKinner  CHIEF COMPLAINT:   Chief Complaint  Patient presents with  . Chest Pain    HISTORY OF PRESENT ILLNESS:  Logan Musterrvell Caniglia  is a 84 y.o. male with a known history of diabetes, past tobacco abuse presents to the hospital complaining of 3 days of chest pain and shortness of breath.  Patient was also having chills.  Here in the emergency room he has been found to have right upper lobe pneumonia with high curb 65 score and is being admitted for IV antibiotics due to his risk factors.  PAST MEDICAL HISTORY:   Past Medical History:  Diagnosis Date  . Anemia   . Chronic kidney disease    "functioning at 50%"  . Diabetes mellitus, type 2 (HCC)   . Hypothyroidism   . Osteoporosis   . Sleep apnea    could not tolerate CPAP  . Wears dentures    partial upper and lower    PAST SURGICAL HISTORY:   Past Surgical History:  Procedure Laterality Date  . CARDIAC CATHETERIZATION  2006   stent x1  . CATARACT EXTRACTION W/PHACO Right 12/19/2016   Procedure: CATARACT EXTRACTION PHACO AND INTRAOCULAR LENS PLACEMENT (IOC) RIGHT DIABETIC;  Surgeon: Lockie MolaBrasington, Chadwick, MD;  [facility];  Service: Ophthalmology;  Laterality: Right;  . TRABECULECTOMY Right 12/19/2016   Procedure: TRABECULECTOMY WITH [facility];  Surgeon: Lockie MolaBrasington, Chadwick, MD;  [facility];  Service: Ophthalmology;  Laterality: Right;  Diabetic - insulin pump sleep apnea    SOCIAL HISTORY:   Social History   Tobacco Use  . Smoking status: Former Smoker    Last attempt to quit: 1971    Years since quitting: 48.2  . Smokeless tobacco: Never Used  Substance Use Topics  . Alcohol use: Never    Frequency: Never    FAMILY  HISTORY:  No family history on file.  DRUG ALLERGIES:  No Known Allergies  REVIEW OF SYSTEMS:   Review of Systems  Constitutional: Positive for chills and malaise/fatigue. Negative for fever and weight loss.  HENT: Negative for hearing loss and nosebleeds.   Eyes: Negative for blurred vision, double vision and pain.  Respiratory: Positive for cough and shortness of breath. Negative for hemoptysis, sputum production and wheezing.   Cardiovascular: Negative for chest pain, palpitations, orthopnea and leg swelling.  Gastrointestinal: Negative for abdominal pain, constipation, diarrhea, nausea and vomiting.  Genitourinary: Negative for dysuria and hematuria.  Musculoskeletal: Negative for back pain, falls and myalgias.  Skin: Negative for rash.  Neurological: Negative for dizziness, tremors, sensory change, speech change, focal weakness, seizures and headaches.  Endo/Heme/Allergies: Does not bruise/bleed easily.  Psychiatric/Behavioral: Negative for depression and memory loss. The patient is not nervous/anxious.     MEDICATIONS AT HOME:   Prior to Admission medications   Medication Sig Start Date End Date Taking? Authorizing Provider  aspirin 81 MG tablet Take 81 mg by mouth daily.   Yes [provider]  brimonidine (ALPHAGAN) 0.2 % ophthalmic solution Place into the right eye 2 (two) times daily.    Yes [provider]  CALCIUM CARBONATE-VITAMIN D PO Take by mouth daily.   Yes [provider]  ciclopirox (PENLAC) 8 % solution Apply topically at bedtime. Apply  over nail and surrounding skin. Apply daily over previous coat. After seven (7) days, may remove with alcohol and continue cycle.   Yes [provider]  CYANOCOBALAMIN PO Take by mouth daily.   Yes [provider]  diphenhydramine-acetaminophen (TYLENOL PM) 25-500 MG TABS tablet Take 1 tablet by mouth at bedtime as needed.   Yes [provider]  dorzolamide (TRUSOPT) 2 %  ophthalmic solution Place 1 drop into the right eye 2 (two) times daily.    Yes [provider]  insulin aspart (NOVOLOG) 100 UNIT/ML injection Inject into the skin. In pump.  Basil and bolus before each meal   Yes [provider]  insulin glargine (LANTUS) 100 UNIT/ML injection Inject into the skin as needed (use when off pump).   Yes [provider]  latanoprost (XALATAN) 0.005 % ophthalmic solution Place 1 drop into the right eye at bedtime.    Yes [provider]  levothyroxine (SYNTHROID, LEVOTHROID) 50 MCG tablet Take 50 mcg by mouth daily before breakfast.   Yes [provider]  lisinopril (PRINIVIL,ZESTRIL) 5 MG tablet Take 5 mg by mouth daily.    Yes [provider]  Omega-3 Fatty Acids (FISH OIL PO) Take 1 capsule by mouth daily.    Yes [provider]  permethrin (ELIMITE) 5 % cream Apply 1 application topically daily.   Yes [provider]  simvastatin (ZOCOR) 40 MG tablet Take 40 mg by mouth daily.   Yes [provider]  timolol (BETIMOL) 0.5 % ophthalmic solution Place 1 drop into both eyes 2 (two) times daily.   Yes [provider]  Acetaminophen (TYLENOL PO) Take by mouth at bedtime as needed.    [provider]  Netarsudil Dimesylate 0.02 % SOLN Apply to eye daily.    [provider]     VITAL SIGNS:  Blood pressure (!) 118/42, pulse (!) 33, temperature 97.6 F (36.4 C), temperature source Oral, resp. rate 18, height 5\' 7"  (1.702 m), weight 57.6 kg (127 lb), SpO2 98 %.  PHYSICAL EXAMINATION:  Physical Exam  GENERAL:  84 y.o.-year-old patient lying in the bed with no acute distress.  EYES: Pupils equal, round, reactive to light and accommodation. No scleral icterus. Extraocular muscles intact.  HEENT: Head atraumatic, normocephalic. Oropharynx and nasopharynx clear. No oropharyngeal erythema, moist oral mucosa  NECK:  Supple, no jugular venous distention. No thyroid  enlargement, no tenderness.  LUNGS: Normal breath sounds bilaterally, no wheezing, rales, rhonchi. No use of accessory muscles of respiration.  CARDIOVASCULAR: S1, S2 normal. No murmurs, rubs, or gallops.  ABDOMEN: Soft, nontender, nondistended. Bowel sounds present. No organomegaly or mass.  EXTREMITIES: No pedal edema, cyanosis, or clubbing. + 2 pedal & radial pulses b/l.   NEUROLOGIC: Cranial nerves II through XII are intact. No focal Motor or sensory deficits appreciated b/l PSYCHIATRIC: The patient is alert and oriented x 3. Good affect.  SKIN: No obvious rash, lesion, or ulcer.   LABORATORY PANEL:   CBC Recent Labs  Lab 06/07/17 2037  WBC 7.9  HGB 11.7*  HCT 34.8*  PLT 188   ------------------------------------------------------------------------------------------------------------------  Chemistries  Recent Labs  Lab 06/07/17 2037  NA 132*  K 4.7  CL 102  CO2 17*  GLUCOSE 374*  BUN 43*  CREATININE 1.79*  CALCIUM 8.7*   ------------------------------------------------------------------------------------------------------------------  Cardiac Enzymes Recent Labs  Lab 06/07/17 2037  TROPONINI <0.03   ------------------------------------------------------------------------------------------------------------------  RADIOLOGY:  Dg Chest 2 View  Result Date: 06/07/2017 CLINICAL DATA:  Initial  evaluation for acute chest pain, shortness of breath, weakness. EXAM: CHEST - 2 VIEW COMPARISON:  None. FINDINGS: Cardiac and mediastinal silhouettes are within normal limits. Aortic atherosclerosis. Lungs are hyperinflated with underlying emphysematous changes. Superimposed hazy infiltrate at the inferior right upper lobe, suspicious for pneumonia. No other focal airspace disease. No pulmonary edema or pleural effusion. No pneumothorax. Irregular scarring and/or architectural distortion present at the right lung apex. No acute osseus abnormality. IMPRESSION: 1. Focal right upper  lobe infiltrate, suspicious for pneumonia. 2. Underlying emphysema. 3. Aortic atherosclerosis. Electronically Signed   By: Rise Mu M.D.   On: 06/07/2017 21:07     IMPRESSION AND PLAN:   *Right upper lobe community-acquired pneumonia.  Start IV Levaquin.  Oxygen as needed.  Nebulizers as needed.  *Acute kidney injury over CKD stage III.  Start IV fluids.  Repeat labs in the morning.  Monitor input and output.  *Diabetes mellitus.  Continue insulin pump.  Continue blood sugar checks.  *Hypertension.  Continue lisinopril  DVT prophylaxis with Lovenox  All the records are reviewed and case discussed with ED provider. Management plans discussed with the patient, family and they are in agreement.  CODE STATUS: Full code  TOTAL TIME TAKING CARE OF THIS PATIENT: 40 minutes.   Molinda Bailiff Ural Acree M.D on 06/07/2017 at 10:37 PM  Between 7am to 6pm - Pager - [phone]  After 6pm go to [email or web address] - password [email or web address]  [facility]  Office  [phone]  CC: Primary care physician; Mosetta Pigeon, MD  Note: This dictation was prepared with Dragon dictation along with smaller phrase technology. Any transcriptional errors that result from this process are unintentional.

## 2017-06-08 ENCOUNTER — Other Ambulatory Visit: Payer: Self-pay

## 2017-06-08 DIAGNOSIS — E1122 Type 2 diabetes mellitus with diabetic chronic kidney disease: Secondary | ICD-10-CM | POA: Diagnosis not present

## 2017-06-08 DIAGNOSIS — I129 Hypertensive chronic kidney disease with stage 1 through stage 4 chronic kidney disease, or unspecified chronic kidney disease: Secondary | ICD-10-CM | POA: Diagnosis not present

## 2017-06-08 DIAGNOSIS — E119 Type 2 diabetes mellitus without complications: Secondary | ICD-10-CM | POA: Diagnosis not present

## 2017-06-08 DIAGNOSIS — E785 Hyperlipidemia, unspecified: Secondary | ICD-10-CM | POA: Diagnosis not present

## 2017-06-08 DIAGNOSIS — M810 Age-related osteoporosis without current pathological fracture: Secondary | ICD-10-CM | POA: Diagnosis not present

## 2017-06-08 DIAGNOSIS — I10 Essential (primary) hypertension: Secondary | ICD-10-CM | POA: Diagnosis not present

## 2017-06-08 DIAGNOSIS — N179 Acute kidney failure, unspecified: Secondary | ICD-10-CM | POA: Diagnosis not present

## 2017-06-08 DIAGNOSIS — E039 Hypothyroidism, unspecified: Secondary | ICD-10-CM | POA: Diagnosis not present

## 2017-06-08 DIAGNOSIS — J181 Lobar pneumonia, unspecified organism: Secondary | ICD-10-CM | POA: Diagnosis not present

## 2017-06-08 DIAGNOSIS — R079 Chest pain, unspecified: Secondary | ICD-10-CM | POA: Diagnosis not present

## 2017-06-08 DIAGNOSIS — J189 Pneumonia, unspecified organism: Secondary | ICD-10-CM | POA: Diagnosis not present

## 2017-06-08 DIAGNOSIS — G4730 Sleep apnea, unspecified: Secondary | ICD-10-CM | POA: Diagnosis not present

## 2017-06-08 DIAGNOSIS — N183 Chronic kidney disease, stage 3 (moderate): Secondary | ICD-10-CM | POA: Diagnosis not present

## 2017-06-08 LAB — CBC
HEMATOCRIT: 33.2 % — AB (ref 40.0–52.0)
Hemoglobin: 11 g/dL — ABNORMAL LOW (ref 13.0–18.0)
MCH: 29.9 pg (ref 26.0–34.0)
MCHC: 33.1 g/dL (ref 32.0–36.0)
MCV: 90.4 fL (ref 80.0–100.0)
PLATELETS: 185 10*3/uL (ref 150–440)
RBC: 3.67 MIL/uL — AB (ref 4.40–5.90)
RDW: 14.1 % (ref 11.5–14.5)
WBC: 7.3 10*3/uL (ref 3.8–10.6)

## 2017-06-08 LAB — BASIC METABOLIC PANEL
Anion gap: 9 (ref 5–15)
BUN: 43 mg/dL — ABNORMAL HIGH (ref 6–20)
CO2: 20 mmol/L — ABNORMAL LOW (ref 22–32)
CREATININE: 1.77 mg/dL — AB (ref 0.61–1.24)
Calcium: 8.3 mg/dL — ABNORMAL LOW (ref 8.9–10.3)
Chloride: 105 mmol/L (ref 101–111)
GFR calc non Af Amer: 34 mL/min — ABNORMAL LOW (ref >60)
GFR, EST AFRICAN AMERICAN: 39 mL/min — AB (ref >60)
Glucose, Bld: 306 mg/dL — ABNORMAL HIGH (ref 65–99)
POTASSIUM: 4.2 mmol/L (ref 3.5–5.1)
SODIUM: 134 mmol/L — AB (ref 135–145)

## 2017-06-08 LAB — HEMOGLOBIN A1C
Hgb A1c MFr Bld: 6.3 % — ABNORMAL HIGH (ref 4.8–5.6)
Mean Plasma Glucose: 134.11 mg/dL

## 2017-06-08 LAB — LACTIC ACID, PLASMA: Lactic Acid, Venous: 1.2 mmol/L (ref 0.5–1.9)

## 2017-06-08 LAB — GLUCOSE, CAPILLARY: GLUCOSE-CAPILLARY: 169 mg/dL — AB (ref 65–99)

## 2017-06-08 MED ORDER — LEVOFLOXACIN 750 MG PO TABS: 750.0000 mg | ORAL_TABLET | ORAL | 0 refills | Status: DC

## 2017-06-08 MED ORDER — LEVOFLOXACIN 750 MG PO TABS: 750.0000 mg | ORAL_TABLET | Freq: Every day | ORAL | 0 refills | Status: DC

## 2017-06-08 MED ORDER — ENOXAPARIN SODIUM 30 MG/0.3ML ~~LOC~~ SOLN
30.0000 mg | SUBCUTANEOUS | Status: DC
  Administered 2017-06-08: 30 mg via SUBCUTANEOUS
  Filled 2017-06-08: qty 0.3

## 2017-06-08 NOTE — Progress Notes (Signed)
Patient discharged home per MD order. Prescription given to patient. All discharge instructions given and all questions answered. 

## 2017-06-08 NOTE — Discharge Summary (Signed)
[facility] [employer] -  at [facility]   PATIENT NAME: Logan Strong    MR#:  [id number]  DATE OF BIRTH:  [birth year]  DATE OF ADMISSION:  06/07/2017 ADMITTING PHYSICIAN: Milagros Loll, MD  DATE OF DISCHARGE: 06/08/2017  PRIMARY CARE PHYSICIAN: Mosetta Pigeon, MD    ADMISSION DIAGNOSIS:  Community acquired pneumonia of right middle lobe of lung (HCC) [J18.1]  DISCHARGE DIAGNOSIS:  Active Problems:   Pneumonia   SECONDARY DIAGNOSIS:   Past Medical History:  Diagnosis Date  . Anemia   . Chronic kidney disease    "functioning at 50%"  . Diabetes mellitus, type 2 (HCC)   . Hypothyroidism   . Osteoporosis   . Sleep apnea    could not tolerate CPAP  . Wears dentures    partial upper and lower    HOSPITAL COURSE:  84 year old male with history of chronic kidney disease stage III and diabetes who presented to the emergency room with shortness of breath.   1.  Community-acquired pneumonia: Patient is doing well this morning.  He will be discharged on Levaquin which is renally dosed.  2.  Diabetes: Patient will continue outpatient regimen  3.  Chronic kidney disease stage III: Creatinine remained stable  4.  Hypothyroidism: Continue Synthroid  5.  Essential hypertension: Continue lisinopril  6.  Hyperlipidemia: Continue statin  DISCHARGE CONDITIONS AND DIET:   Stable for discharge on heart healthy diabetic diet  CONSULTS OBTAINED:    DRUG ALLERGIES:  No Known Allergies  DISCHARGE MEDICATIONS:   Allergies as of 06/08/2017   No Known Allergies     Medication List    TAKE these medications   aspirin 81 MG tablet Take 81 mg by mouth daily.   brimonidine 0.2 % ophthalmic solution Commonly known as:  ALPHAGAN Place into the right eye 2 (two) times daily.   CALCIUM CARBONATE-VITAMIN D PO Take by mouth daily.   ciclopirox 8 % solution Commonly known as:  PENLAC Apply topically at bedtime. Apply over nail and surrounding skin. Apply daily  over previous coat. After seven (7) days, may remove with alcohol and continue cycle.   CYANOCOBALAMIN PO Take by mouth daily.   diphenhydramine-acetaminophen 25-500 MG Tabs tablet Commonly known as:  TYLENOL PM Take 1 tablet by mouth at bedtime as needed.   dorzolamide 2 % ophthalmic solution Commonly known as:  TRUSOPT Place 1 drop into the right eye 2 (two) times daily.   FISH OIL PO Take 1 capsule by mouth daily.   insulin aspart 100 UNIT/ML injection Commonly known as:  novoLOG Inject into the skin. In pump.  Basil and bolus before each meal   insulin glargine 100 UNIT/ML injection Commonly known as:  LANTUS Inject into the skin as needed (use when off pump).   latanoprost 0.005 % ophthalmic solution Commonly known as:  XALATAN Place 1 drop into the right eye at bedtime.   levofloxacin 750 MG tablet Commonly known as:  LEVAQUIN Take 1 tablet (750 mg total) by mouth every other day for 4 days. Start taking on:  06/09/2017   levothyroxine 50 MCG tablet Commonly known as:  SYNTHROID, LEVOTHROID Take 50 mcg by mouth daily before breakfast.   lisinopril 5 MG tablet Commonly known as:  PRINIVIL,ZESTRIL Take 5 mg by mouth daily.   Netarsudil Dimesylate 0.02 % Soln Apply to eye daily.   permethrin 5 % cream Commonly known as:  ELIMITE Apply 1 application topically daily.   simvastatin 40 MG tablet Commonly known as:  ZOCOR Take 40 mg by mouth daily.   timolol 0.5 % ophthalmic solution Commonly known as:  BETIMOL Place 1 drop into both eyes 2 (two) times daily.   TYLENOL PO Take by mouth at bedtime as needed.         Today   CHIEF COMPLAINT:   We will this morning.   VITAL SIGNS:  Blood pressure (!) 142/43, pulse (!) 40, temperature 98.2 F (36.8 C), temperature source Oral, resp. rate 16, height 5\' 7"  (1.702 m), weight 57.6 kg (127 lb), SpO2 96 %.   REVIEW OF SYSTEMS:  Review of Systems  Constitutional: Negative.  Negative for chills, fever  and malaise/fatigue.  HENT: Negative.  Negative for ear discharge, ear pain, hearing loss, nosebleeds and sore throat.   Eyes: Negative.  Negative for blurred vision and pain.  Respiratory: Negative.  Negative for cough, hemoptysis, shortness of breath and wheezing.   Cardiovascular: Negative.  Negative for chest pain, palpitations and leg swelling.  Gastrointestinal: Negative.  Negative for abdominal pain, blood in stool, diarrhea, nausea and vomiting.  Genitourinary: Negative.  Negative for dysuria.  Musculoskeletal: Negative.  Negative for back pain.  Skin: Negative.   Neurological: Negative for dizziness, tremors, speech change, focal weakness, seizures and headaches.  Endo/Heme/Allergies: Negative.  Does not bruise/bleed easily.  Psychiatric/Behavioral: Negative.  Negative for depression, hallucinations and suicidal ideas.     PHYSICAL EXAMINATION:  GENERAL:  84 y.o.-year-old patient lying in the bed with no acute distress.  NECK:  Supple, no jugular venous distention. No thyroid enlargement, no tenderness.  LUNGS: Normal breath sounds bilaterally, no wheezing, rales,rhonchi  No use of accessory muscles of respiration.  CARDIOVASCULAR: S1, S2 normal. No murmurs, rubs, or gallops.  ABDOMEN: Soft, non-tender, non-distended. Bowel sounds present. No organomegaly or mass.  EXTREMITIES: No pedal edema, cyanosis, or clubbing.  PSYCHIATRIC: The patient is alert and oriented x 3.  SKIN: No obvious rash, lesion, or ulcer.   DATA REVIEW:   CBC Recent Labs  Lab 06/07/17 2346  WBC 7.3  HGB 11.0*  HCT 33.2*  PLT 185    Chemistries  Recent Labs  Lab 06/07/17 2346  NA 134*  K 4.2  CL 105  CO2 20*  GLUCOSE 306*  BUN 43*  CREATININE 1.77*  CALCIUM 8.3*    Cardiac Enzymes Recent Labs  Lab 06/07/17 2037  TROPONINI <0.03    Microbiology Results  @[email or web address] @  RADIOLOGY:  Dg Chest 2 View  Result Date: 06/07/2017 CLINICAL DATA:  Initial evaluation for acute chest pain,  shortness of breath, weakness. EXAM: CHEST - 2 VIEW COMPARISON:  None. FINDINGS: Cardiac and mediastinal silhouettes are within normal limits. Aortic atherosclerosis. Lungs are hyperinflated with underlying emphysematous changes. Superimposed hazy infiltrate at the inferior right upper lobe, suspicious for pneumonia. No other focal airspace disease. No pulmonary edema or pleural effusion. No pneumothorax. Irregular scarring and/or architectural distortion present at the right lung apex. No acute osseus abnormality. IMPRESSION: 1. Focal right upper lobe infiltrate, suspicious for pneumonia. 2. Underlying emphysema. 3. Aortic atherosclerosis. Electronically Signed   By: Rise MuBenjamin  McClintock M.D.   On: 06/07/2017 21:07      Allergies as of 06/08/2017   No Known Allergies     Medication List    TAKE these medications   aspirin 81 MG tablet Take 81 mg by mouth daily.   brimonidine 0.2 % ophthalmic solution Commonly known as:  ALPHAGAN Place into the right eye 2 (two) times daily.   CALCIUM CARBONATE-VITAMIN D PO Take  by mouth daily.   ciclopirox 8 % solution Commonly known as:  PENLAC Apply topically at bedtime. Apply over nail and surrounding skin. Apply daily over previous coat. After seven (7) days, may remove with alcohol and continue cycle.   CYANOCOBALAMIN PO Take by mouth daily.   diphenhydramine-acetaminophen 25-500 MG Tabs tablet Commonly known as:  TYLENOL PM Take 1 tablet by mouth at bedtime as needed.   dorzolamide 2 % ophthalmic solution Commonly known as:  TRUSOPT Place 1 drop into the right eye 2 (two) times daily.   FISH OIL PO Take 1 capsule by mouth daily.   insulin aspart 100 UNIT/ML injection Commonly known as:  novoLOG Inject into the skin. In pump.  Basil and bolus before each meal   insulin glargine 100 UNIT/ML injection Commonly known as:  LANTUS Inject into the skin as needed (use when off pump).   latanoprost 0.005 % ophthalmic solution Commonly  known as:  XALATAN Place 1 drop into the right eye at bedtime.   levofloxacin 750 MG tablet Commonly known as:  LEVAQUIN Take 1 tablet (750 mg total) by mouth every other day for 4 days. Start taking on:  06/09/2017   levothyroxine 50 MCG tablet Commonly known as:  SYNTHROID, LEVOTHROID Take 50 mcg by mouth daily before breakfast.   lisinopril 5 MG tablet Commonly known as:  PRINIVIL,ZESTRIL Take 5 mg by mouth daily.   Netarsudil Dimesylate 0.02 % Soln Apply to eye daily.   permethrin 5 % cream Commonly known as:  ELIMITE Apply 1 application topically daily.   simvastatin 40 MG tablet Commonly known as:  ZOCOR Take 40 mg by mouth daily.   timolol 0.5 % ophthalmic solution Commonly known as:  BETIMOL Place 1 drop into both eyes 2 (two) times daily.   TYLENOL PO Take by mouth at bedtime as needed.           Management plans discussed with the patient and he is in agreement. Stable for discharge home  Patient should follow up with pcp  CODE STATUS:     Code Status Orders  (From admission, onward)        Start     Ordered   06/07/17 2234  Full code  Continuous     06/07/17 2234    Code Status History    This patient has a current code status but no historical code status.    Advance Directive Documentation     Most Recent Value  Type of Advance Directive  Healthcare Power of Attorney, Living will  Pre-existing out of facility DNR order (yellow form or pink MOST form)  -  "MOST" Form in Place?  -      TOTAL TIME TAKING CARE OF THIS PATIENT: 38 minutes.    Note: This dictation was prepared with Dragon dictation along with smaller phrase technology. Any transcriptional errors that result from this process are unintentional.  Deshawn Skelley M.D on 06/08/2017 at 8:36 AM  Between 7am to 6pm - Pager - [phone] After 6pm go to [email or web address] - Social research officer, government  [facility]  Office  [phone]  CC: Primary care physician; Mosetta Pigeon, MD

## 2017-06-11 DIAGNOSIS — G4733 Obstructive sleep apnea (adult) (pediatric): Secondary | ICD-10-CM | POA: Diagnosis not present

## 2017-06-11 DIAGNOSIS — R0609 Other forms of dyspnea: Secondary | ICD-10-CM | POA: Diagnosis not present

## 2017-06-11 DIAGNOSIS — R69 Illness, unspecified: Secondary | ICD-10-CM | POA: Diagnosis not present

## 2017-06-11 DIAGNOSIS — I10 Essential (primary) hypertension: Secondary | ICD-10-CM | POA: Diagnosis not present

## 2017-06-11 DIAGNOSIS — R531 Weakness: Secondary | ICD-10-CM | POA: Diagnosis not present

## 2017-06-11 DIAGNOSIS — E119 Type 2 diabetes mellitus without complications: Secondary | ICD-10-CM | POA: Diagnosis not present

## 2017-06-11 DIAGNOSIS — R001 Bradycardia, unspecified: Secondary | ICD-10-CM | POA: Diagnosis not present

## 2017-06-11 DIAGNOSIS — E782 Mixed hyperlipidemia: Secondary | ICD-10-CM | POA: Diagnosis not present

## 2017-06-11 DIAGNOSIS — I442 Atrioventricular block, complete: Secondary | ICD-10-CM | POA: Diagnosis not present

## 2017-06-11 DIAGNOSIS — R42 Dizziness and giddiness: Secondary | ICD-10-CM | POA: Diagnosis not present

## 2017-06-11 DIAGNOSIS — I2510 Atherosclerotic heart disease of native coronary artery without angina pectoris: Secondary | ICD-10-CM | POA: Diagnosis not present

## 2017-06-11 DIAGNOSIS — N183 Chronic kidney disease, stage 3 (moderate): Secondary | ICD-10-CM | POA: Diagnosis not present

## 2017-06-11 DIAGNOSIS — R009 Unspecified abnormalities of heart beat: Secondary | ICD-10-CM | POA: Diagnosis not present

## 2017-06-11 DIAGNOSIS — R0602 Shortness of breath: Secondary | ICD-10-CM | POA: Diagnosis not present

## 2017-06-11 DIAGNOSIS — J181 Lobar pneumonia, unspecified organism: Secondary | ICD-10-CM | POA: Diagnosis not present

## 2017-06-12 ENCOUNTER — Other Ambulatory Visit: Payer: Self-pay

## 2017-06-12 ENCOUNTER — Encounter: Payer: Self-pay | Admitting: Emergency Medicine

## 2017-06-12 ENCOUNTER — Ambulatory Visit
Admission: RE | Admit: 2017-06-12 | Discharge: 2017-06-12 | Disposition: A | Discharge status: Home / Self Care | Payer: Medicare HMO | Source: Ambulatory Visit | Attending: Cardiology | Admitting: Cardiology

## 2017-06-12 ENCOUNTER — Inpatient Hospital Stay
Admission: EM | Admit: 2017-06-12 | Discharge: 2017-06-16 | DRG: 242 | Disposition: A | Discharge status: Home Health | Payer: Medicare HMO | Attending: Specialist | Admitting: Specialist

## 2017-06-12 ENCOUNTER — Encounter
Admission: RE | Admit: 2017-06-12 | Discharge: 2017-06-12 | Disposition: A | Discharge status: Home / Self Care | Payer: Medicare HMO | Source: Ambulatory Visit | Attending: Cardiology | Admitting: Cardiology

## 2017-06-12 DIAGNOSIS — I442 Atrioventricular block, complete: Principal | ICD-10-CM | POA: Diagnosis present

## 2017-06-12 DIAGNOSIS — N189 Chronic kidney disease, unspecified: Secondary | ICD-10-CM | POA: Diagnosis not present

## 2017-06-12 DIAGNOSIS — Z01818 Encounter for other preprocedural examination: Secondary | ICD-10-CM | POA: Diagnosis not present

## 2017-06-12 DIAGNOSIS — R739 Hyperglycemia, unspecified: Secondary | ICD-10-CM | POA: Diagnosis present

## 2017-06-12 DIAGNOSIS — N179 Acute kidney failure, unspecified: Secondary | ICD-10-CM | POA: Diagnosis not present

## 2017-06-12 DIAGNOSIS — I509 Heart failure, unspecified: Secondary | ICD-10-CM | POA: Diagnosis not present

## 2017-06-12 DIAGNOSIS — J811 Chronic pulmonary edema: Secondary | ICD-10-CM | POA: Diagnosis not present

## 2017-06-12 DIAGNOSIS — R001 Bradycardia, unspecified: Secondary | ICD-10-CM | POA: Diagnosis not present

## 2017-06-12 DIAGNOSIS — N183 Chronic kidney disease, stage 3 (moderate): Secondary | ICD-10-CM | POA: Diagnosis present

## 2017-06-12 DIAGNOSIS — M810 Age-related osteoporosis without current pathological fracture: Secondary | ICD-10-CM | POA: Diagnosis present

## 2017-06-12 DIAGNOSIS — Z79899 Other long term (current) drug therapy: Secondary | ICD-10-CM | POA: Diagnosis not present

## 2017-06-12 DIAGNOSIS — I2510 Atherosclerotic heart disease of native coronary artery without angina pectoris: Secondary | ICD-10-CM | POA: Diagnosis present

## 2017-06-12 DIAGNOSIS — E039 Hypothyroidism, unspecified: Secondary | ICD-10-CM | POA: Diagnosis present

## 2017-06-12 DIAGNOSIS — Z950 Presence of cardiac pacemaker: Secondary | ICD-10-CM

## 2017-06-12 DIAGNOSIS — I5021 Acute systolic (congestive) heart failure: Secondary | ICD-10-CM | POA: Diagnosis not present

## 2017-06-12 DIAGNOSIS — J9601 Acute respiratory failure with hypoxia: Secondary | ICD-10-CM | POA: Diagnosis not present

## 2017-06-12 DIAGNOSIS — Z7982 Long term (current) use of aspirin: Secondary | ICD-10-CM | POA: Diagnosis not present

## 2017-06-12 DIAGNOSIS — E119 Type 2 diabetes mellitus without complications: Secondary | ICD-10-CM | POA: Diagnosis not present

## 2017-06-12 DIAGNOSIS — I5031 Acute diastolic (congestive) heart failure: Secondary | ICD-10-CM | POA: Diagnosis not present

## 2017-06-12 DIAGNOSIS — G4730 Sleep apnea, unspecified: Secondary | ICD-10-CM | POA: Diagnosis present

## 2017-06-12 DIAGNOSIS — E1165 Type 2 diabetes mellitus with hyperglycemia: Secondary | ICD-10-CM | POA: Diagnosis not present

## 2017-06-12 DIAGNOSIS — Z9641 Presence of insulin pump (external) (internal): Secondary | ICD-10-CM | POA: Diagnosis present

## 2017-06-12 DIAGNOSIS — J90 Pleural effusion, not elsewhere classified: Secondary | ICD-10-CM | POA: Diagnosis not present

## 2017-06-12 DIAGNOSIS — E1122 Type 2 diabetes mellitus with diabetic chronic kidney disease: Secondary | ICD-10-CM | POA: Diagnosis not present

## 2017-06-12 DIAGNOSIS — H409 Unspecified glaucoma: Secondary | ICD-10-CM | POA: Diagnosis present

## 2017-06-12 DIAGNOSIS — E1151 Type 2 diabetes mellitus with diabetic peripheral angiopathy without gangrene: Secondary | ICD-10-CM | POA: Diagnosis present

## 2017-06-12 DIAGNOSIS — Z87891 Personal history of nicotine dependence: Secondary | ICD-10-CM

## 2017-06-12 DIAGNOSIS — R0902 Hypoxemia: Secondary | ICD-10-CM | POA: Diagnosis not present

## 2017-06-12 DIAGNOSIS — Z01810 Encounter for preprocedural cardiovascular examination: Secondary | ICD-10-CM | POA: Diagnosis not present

## 2017-06-12 DIAGNOSIS — Z794 Long term (current) use of insulin: Secondary | ICD-10-CM | POA: Diagnosis not present

## 2017-06-12 HISTORY — DX: Pneumonia, unspecified organism: J18.9

## 2017-06-12 HISTORY — DX: Unspecified glaucoma: H40.9

## 2017-06-12 HISTORY — DX: Cardiac arrhythmia, unspecified: I49.9

## 2017-06-12 HISTORY — DX: Atherosclerotic heart disease of native coronary artery without angina pectoris: I25.10

## 2017-06-12 LAB — CBC
HCT: 36.2 % — ABNORMAL LOW (ref 40.0–52.0)
HCT: 35.3 % — ABNORMAL LOW (ref 40.0–52.0)
HEMOGLOBIN: 11.8 g/dL — AB (ref 13.0–18.0)
Hemoglobin: 11.7 g/dL — ABNORMAL LOW (ref 13.0–18.0)
MCH: 29.7 pg (ref 26.0–34.0)
MCH: 30.4 pg (ref 26.0–34.0)
MCHC: 32.3 g/dL (ref 32.0–36.0)
MCHC: 33.4 g/dL (ref 32.0–36.0)
MCV: 91.9 fL (ref 80.0–100.0)
MCV: 90.8 fL (ref 80.0–100.0)
PLATELETS: 168 10*3/uL (ref 150–440)
Platelets: 193 10*3/uL (ref 150–440)
RBC: 3.94 MIL/uL — ABNORMAL LOW (ref 4.40–5.90)
RBC: 3.89 MIL/uL — ABNORMAL LOW (ref 4.40–5.90)
RDW: 14.7 % — AB (ref 11.5–14.5)
RDW: 14.5 % (ref 11.5–14.5)
WBC: 6.8 10*3/uL (ref 3.8–10.6)
WBC: 6.1 10*3/uL (ref 3.8–10.6)

## 2017-06-12 LAB — BASIC METABOLIC PANEL
Anion gap: 10 (ref 5–15)
Anion gap: 11 (ref 5–15)
BUN: 49 mg/dL — AB (ref 6–20)
BUN: 49 mg/dL — AB (ref 6–20)
CO2: 20 mmol/L — ABNORMAL LOW (ref 22–32)
CO2: 18 mmol/L — AB (ref 22–32)
CREATININE: 2.33 mg/dL — AB (ref 0.61–1.24)
CREATININE: 2.24 mg/dL — AB (ref 0.61–1.24)
Calcium: 8.4 mg/dL — ABNORMAL LOW (ref 8.9–10.3)
Calcium: 8.7 mg/dL — ABNORMAL LOW (ref 8.9–10.3)
Chloride: 104 mmol/L (ref 101–111)
Chloride: 106 mmol/L (ref 101–111)
GFR calc Af Amer: 28 mL/min — ABNORMAL LOW (ref >60)
GFR calc Af Amer: 29 mL/min — ABNORMAL LOW (ref >60)
GFR calc non Af Amer: 25 mL/min — ABNORMAL LOW (ref >60)
GFR, EST NON AFRICAN AMERICAN: 24 mL/min — AB (ref >60)
Glucose, Bld: 665 mg/dL (ref 65–99)
Glucose, Bld: 502 mg/dL (ref 65–99)
POTASSIUM: 4.8 mmol/L (ref 3.5–5.1)
Potassium: 4.5 mmol/L (ref 3.5–5.1)
SODIUM: 134 mmol/L — AB (ref 135–145)
Sodium: 135 mmol/L (ref 135–145)

## 2017-06-12 LAB — PROTIME-INR
INR: 1.2
PROTHROMBIN TIME: 15.1 s (ref 11.4–15.2)

## 2017-06-12 LAB — GLUCOSE, CAPILLARY
GLUCOSE-CAPILLARY: 262 mg/dL — AB (ref 65–99)
GLUCOSE-CAPILLARY: 288 mg/dL — AB (ref 65–99)
Glucose-Capillary: 446 mg/dL — ABNORMAL HIGH (ref 65–99)
Glucose-Capillary: 308 mg/dL — ABNORMAL HIGH (ref 65–99)
Glucose-Capillary: 239 mg/dL — ABNORMAL HIGH (ref 65–99)

## 2017-06-12 LAB — CULTURE, BLOOD (ROUTINE X 2)
CULTURE: NO GROWTH
CULTURE: NO GROWTH
SPECIAL REQUESTS: ADEQUATE
Special Requests: ADEQUATE

## 2017-06-12 LAB — SURGICAL PCR SCREEN
MRSA, PCR: NEGATIVE
Staphylococcus aureus: NEGATIVE

## 2017-06-12 LAB — APTT: APTT: 39 s — AB (ref 24–36)

## 2017-06-12 MED ORDER — INSULIN ASPART 100 UNIT/ML ~~LOC~~ SOLN
0.0000 [IU] | Freq: Three times a day (TID) | SUBCUTANEOUS | Status: DC
  Administered 2017-06-12: 8 [IU] via SUBCUTANEOUS
  Administered 2017-06-13: 5 [IU] via SUBCUTANEOUS
  Administered 2017-06-14: 15 [IU] via SUBCUTANEOUS
  Administered 2017-06-14: 8 [IU] via SUBCUTANEOUS
  Filled 2017-06-12 (×4): qty 1

## 2017-06-12 MED ORDER — DORZOLAMIDE HCL 2 % OP SOLN
1.0000 [drp] | Freq: Two times a day (BID) | OPHTHALMIC | Status: DC
  Filled 2017-06-12: qty 10

## 2017-06-12 MED ORDER — CALCIUM CARBONATE-VITAMIN D 500-200 MG-UNIT PO TABS
1.0000 | ORAL_TABLET | Freq: Two times a day (BID) | ORAL | Status: DC
  Administered 2017-06-12 – 2017-06-16 (×8): 1 via ORAL
  Filled 2017-06-12 (×8): qty 1

## 2017-06-12 MED ORDER — CEFAZOLIN SODIUM-DEXTROSE 1-4 GM/50ML-% IV SOLN
1.0000 g | Freq: Once | INTRAVENOUS | Status: DC
Start: 2017-06-12 — End: 2017-06-13

## 2017-06-12 MED ORDER — SIMVASTATIN 20 MG PO TABS
40.0000 mg | ORAL_TABLET | Freq: Every day | ORAL | Status: DC
  Administered 2017-06-13 – 2017-06-16 (×4): 40 mg via ORAL
  Filled 2017-06-12 (×4): qty 2

## 2017-06-12 MED ORDER — HYDRALAZINE HCL 20 MG/ML IJ SOLN
10.0000 mg | INTRAMUSCULAR | Status: DC | PRN
  Filled 2017-06-12: qty 1

## 2017-06-12 MED ORDER — BRIMONIDINE TARTRATE 0.2 % OP SOLN
1.0000 [drp] | Freq: Two times a day (BID) | OPHTHALMIC | Status: DC
  Administered 2017-06-12 – 2017-06-16 (×8): 1 [drp] via OPHTHALMIC
  Filled 2017-06-12: qty 5

## 2017-06-12 MED ORDER — DORZOLAMIDE HCL 2 % OP SOLN
1.0000 [drp] | Freq: Two times a day (BID) | OPHTHALMIC | Status: DC
  Administered 2017-06-12 – 2017-06-16 (×8): 1 [drp] via OPHTHALMIC
  Filled 2017-06-12: qty 10

## 2017-06-12 MED ORDER — INSULIN GLARGINE 100 UNIT/ML ~~LOC~~ SOLN
12.0000 [IU] | Freq: Every day | SUBCUTANEOUS | Status: DC
  Administered 2017-06-12 – 2017-06-15 (×4): 12 [IU] via SUBCUTANEOUS
  Filled 2017-06-12 (×6): qty 0.12

## 2017-06-12 MED ORDER — SODIUM CHLORIDE 0.9 % IV BOLUS
1000.0000 mL | Freq: Once | INTRAVENOUS | Status: AC
  Administered 2017-06-12: 1000 mL via INTRAVENOUS

## 2017-06-12 MED ORDER — TIMOLOL MALEATE 0.5 % OP SOLN
1.0000 [drp] | Freq: Two times a day (BID) | OPHTHALMIC | Status: DC
  Administered 2017-06-13 – 2017-06-16 (×5): 1 [drp] via OPHTHALMIC
  Filled 2017-06-12: qty 5

## 2017-06-12 MED ORDER — ONDANSETRON HCL 4 MG/2ML IJ SOLN: 4.0000 mg | Freq: Four times a day (QID) | INTRAMUSCULAR | Status: DC | PRN

## 2017-06-12 MED ORDER — ACETAMINOPHEN 650 MG RE SUPP: 650.0000 mg | Freq: Four times a day (QID) | RECTAL | Status: DC | PRN

## 2017-06-12 MED ORDER — LATANOPROST 0.005 % OP SOLN
1.0000 [drp] | Freq: Every day | OPHTHALMIC | Status: DC
  Filled 2017-06-12: qty 2.5

## 2017-06-12 MED ORDER — INSULIN ASPART 100 UNIT/ML ~~LOC~~ SOLN
0.0000 [IU] | Freq: Every day | SUBCUTANEOUS | Status: DC
  Administered 2017-06-12: 4 [IU] via SUBCUTANEOUS
  Administered 2017-06-15: 2 [IU] via SUBCUTANEOUS
  Filled 2017-06-12 (×2): qty 1

## 2017-06-12 MED ORDER — LEVOTHYROXINE SODIUM 50 MCG PO TABS
50.0000 ug | ORAL_TABLET | Freq: Every day | ORAL | Status: DC
  Administered 2017-06-13 – 2017-06-14 (×2): 50 ug via ORAL
  Filled 2017-06-12 (×2): qty 1

## 2017-06-12 MED ORDER — VITAMIN B-12 1000 MCG PO TABS
1000.0000 ug | ORAL_TABLET | Freq: Two times a day (BID) | ORAL | Status: DC
  Administered 2017-06-12 – 2017-06-16 (×8): 1000 ug via ORAL
  Filled 2017-06-12 (×8): qty 1

## 2017-06-12 MED ORDER — ACETAMINOPHEN 325 MG PO TABS
650.0000 mg | ORAL_TABLET | Freq: Four times a day (QID) | ORAL | Status: DC | PRN
  Administered 2017-06-13 – 2017-06-16 (×3): 650 mg via ORAL
  Filled 2017-06-12 (×3): qty 2

## 2017-06-12 MED ORDER — ASPIRIN EC 81 MG PO TBEC
81.0000 mg | DELAYED_RELEASE_TABLET | Freq: Every day | ORAL | Status: DC
Start: 2017-06-13 — End: 2017-06-16
  Administered 2017-06-14 – 2017-06-16 (×3): 81 mg via ORAL
  Filled 2017-06-12 (×4): qty 1

## 2017-06-12 MED ORDER — LISINOPRIL 5 MG PO TABS
5.0000 mg | ORAL_TABLET | Freq: Every day | ORAL | Status: DC
Start: 2017-06-13 — End: 2017-06-16
  Administered 2017-06-13 – 2017-06-16 (×4): 5 mg via ORAL
  Filled 2017-06-12 (×4): qty 1

## 2017-06-12 MED ORDER — BRIMONIDINE TARTRATE 0.2 % OP SOLN
1.0000 [drp] | Freq: Two times a day (BID) | OPHTHALMIC | Status: DC
  Filled 2017-06-12: qty 5

## 2017-06-12 MED ORDER — OMEGA-3-ACID ETHYL ESTERS 1 G PO CAPS
1.0000 g | ORAL_CAPSULE | Freq: Every day | ORAL | Status: DC
  Administered 2017-06-14 – 2017-06-16 (×3): 1 g via ORAL
  Filled 2017-06-12 (×4): qty 1

## 2017-06-12 MED ORDER — ONDANSETRON HCL 4 MG PO TABS: 4.0000 mg | ORAL_TABLET | Freq: Four times a day (QID) | ORAL | Status: DC | PRN

## 2017-06-12 MED ORDER — HEPARIN SODIUM (PORCINE) 5000 UNIT/ML IJ SOLN
5000.0000 [IU] | Freq: Three times a day (TID) | INTRAMUSCULAR | Status: DC
  Administered 2017-06-12 – 2017-06-16 (×10): 5000 [IU] via SUBCUTANEOUS
  Filled 2017-06-12 (×10): qty 1

## 2017-06-12 MED ORDER — SODIUM CHLORIDE 0.9 % IV SOLN
INTRAVENOUS | Status: DC
  Administered 2017-06-12 – 2017-06-13 (×2): via INTRAVENOUS

## 2017-06-12 MED ORDER — SENNOSIDES-DOCUSATE SODIUM 8.6-50 MG PO TABS: 1.0000 | ORAL_TABLET | Freq: Every evening | ORAL | Status: DC | PRN

## 2017-06-12 MED ORDER — INSULIN ASPART 100 UNIT/ML ~~LOC~~ SOLN
6.0000 [IU] | Freq: Once | SUBCUTANEOUS | Status: AC
  Administered 2017-06-12: 6 [IU] via INTRAVENOUS
  Filled 2017-06-12: qty 1

## 2017-06-12 MED ORDER — LEVOFLOXACIN 750 MG PO TABS
750.0000 mg | ORAL_TABLET | ORAL | Status: DC
  Administered 2017-06-14 – 2017-06-16 (×3): 750 mg via ORAL
  Filled 2017-06-12 (×3): qty 1

## 2017-06-12 MED ORDER — LATANOPROST 0.005 % OP SOLN
1.0000 [drp] | Freq: Every day | OPHTHALMIC | Status: DC
  Administered 2017-06-12 – 2017-06-15 (×5): 1 [drp] via OPHTHALMIC
  Filled 2017-06-12: qty 2.5

## 2017-06-12 NOTE — Progress Notes (Signed)
Inpatient Diabetes Program Recommendations  AACE/ADA: New Consensus Statement on Inpatient Glycemic Control (2015)  Target Ranges:  Prepandial:   less than 140 mg/dL      Peak postprandial:   less than 180 mg/dL (1-2 hours)      Critically ill patients:  140 - 180 mg/dL   Lab Results  Component Value Date   GLUCAP 446 (H) 06/12/2017   HGBA1C 6.3 (H) 06/07/2017    Review of Glycemic ControlResults for Logan Strong, Logan Strong (MRN [id number]) as of 06/12/2017 13:19  Ref. Range 06/12/2017 13:06  Glucose-Capillary Latest Ref Range: 65 - 99 mg/dL 829446 (H)    Diabetes history: Type 1 DM-[facility] Outpatient Diabetes medications:  Medtronic insulin pump Settings per last endocrinologist appointment on 03/2817 are:   Medtronic 630G Off pump plan 12 units Lantus daily. Basal: MN 0.2; 3:30 am 0.6; 4am 0.525; 1130am 0.55; 4pm 0.6; 10:30 0.5 change to: MN 0.2; 330am0.5; 6a0.6; 4p 0.625; 1030p 0.5 I/C: MN 1:10 Insulin On Board: 4 hrs ISF: 45 BG target 110-140 TDD Basal: 12. units  Inpatient Diabetes Program Recommendations:    Patient just arrived to ED from pre-admission testing.  He is scheduled for pacemaker on 06/13/17.  Called and spoke with ED RN by phone.  She was in patient's room.  We asked patient when was the last time he changed his site, and he states "3 days ago".  Therefore he is due to change his insulin pump site today.  Asked RN to have patient's family go home and get his insulin pump supplies so that site may be changed.  She states that she will also discuss with MD.  Currently patient is getting IV bolus of fluids.  This should help as well with blood sugars.  Once patient changes site, he should be able to bolus self with his home insulin pump settings.  Of note it appears that 1 unit of insulin drops blood sugar approximately 45 mg/dL therefore patient is sensitive to insulin doses.    I recommend that patient change insulin pump site soon to correct hyperglycemia  and prevent DKA.   Thanks,  Logan MeagerJenny Naveen Lorusso, RN, BC-ADM Inpatient Diabetes Coordinator Pager [phone] (8a-5p)

## 2017-06-12 NOTE — Patient Instructions (Signed)
Your procedure is scheduled on: 06/13/17 Thurs Report to Same Day Surgery 2nd floor medical mall [facility](Medical Mall Entrance-take elevator on left to 2nd floor.  Check in with surgery information desk.) To find out your arrival time please call [phone] between 1PM - 3PM on 06/12/17 Wed  Remember: Instructions that are not followed completely may result in serious medical risk, up to and including death, or upon the discretion of your surgeon and anesthesiologist your surgery may need to be rescheduled.    _x___ 1. Do not eat food after midnight the night before your procedure. You may drink clear liquids up to 2 hours before you are scheduled to arrive at the hospital for your procedure.  Do not drink clear liquids within 2 hours of your scheduled arrival to the hospital.  Clear liquids include  --Water or Apple juice without pulp  --Clear carbohydrate beverage such as ClearFast or Gatorade  --Black Coffee or Clear Tea (No milk, no creamers, do not add anything to                  the coffee or Tea Type 1 and type 2 diabetics should only drink water.  No gum chewing or hard candies.     __x__ 2. No Alcohol for 24 hours before or after surgery.   __x__3. No Smoking or e-cigarettes for 24 prior to surgery.  Do not use any chewable tobacco products for at least 6 hour prior to surgery   ____  4. Bring all medications with you on the day of surgery if instructed.    __x__ 5. Notify your doctor if there is any change in your medical condition     (cold, fever, infections).    x___6. On the morning of surgery brush your teeth with toothpaste and water.  You may rinse your mouth with mouth wash if you wish.  Do not swallow any toothpaste or mouthwash.   Do not wear jewelry, make-up, hairpins, clips or nail polish.  Do not wear lotions, powders, or perfumes. You may wear deodorant.  Do not shave 48 hours prior to surgery. Men may shave face and neck.  Do not bring valuables to the hospital.     [facility] any belongings or valuables.               Contacts, dentures or bridgework may not be worn into surgery.  Leave your suitcase in the car. After surgery it may be brought to your room.  For patients admitted to the hospital, discharge time is determined by your                       treatment team.  _  Patients discharged the day of surgery will not be allowed to drive home.  You will need someone to drive you home and stay with you the night of your procedure.    Please read over the following fact sheets that you were given:   [facility] Preparing for [facility] and or MRSA Information   _x___ Take anti-hypertensive listed below, cardiac, seizure, asthma,     anti-reflux and psychiatric medicines. These include:  1. Eye drops   2.levothyroxine (SYNTHROID, LEVOTHROID) 50 MCG tablet  3.  4.  5.  6.  ____Fleets enema or Magnesium Citrate as directed.   _x___ Use CHG Soap or sage wipes as directed on instruction sheet   ____ Use inhalers on the day of surgery and  bring to hospital day of surgery  ____ Stop Metformin and Janumet 2 days prior to surgery.    ____ Take 1/2 of usual insulin dose the night before surgery and none on the morning     surgery.   _x___ Follow recommendations from Cardiologist, Pulmonologist or PCP regarding          stopping Aspirin, Coumadin, Plavix ,Eliquis, Effient, or Pradaxa, and Pletal.  X____Stop Anti-inflammatories such as Advil, Aleve, Ibuprofen, Motrin, Naproxen, Naprosyn, Goodies powders or aspirin products. OK to take Tylenol and                          Celebrex.   _x___ Stop supplements until after surgery.  But may continue Vitamin D, Vitamin B,       and multivitamin.   ____ Bring C-Pap to the hospital.

## 2017-06-12 NOTE — Pre-Procedure Instructions (Addendum)
Daughter, Norwood LevoDina, to bring patient to the emergency room.

## 2017-06-12 NOTE — ED Triage Notes (Signed)
Pt sent here from pre op where his blood sugar was around 600.  Was planning for pacemaker in the am. In triage HR 33-38.  Pt states that is where it has been recently.  Denies pain, appears in NAD.

## 2017-06-12 NOTE — ED Notes (Signed)
Hospitalist to bedside at this time 

## 2017-06-12 NOTE — ED Notes (Signed)
Pt's personal insulin pump turned off at this time.

## 2017-06-12 NOTE — Pre-Procedure Instructions (Signed)
Called Dr Henrene HawkingKephart regarding elevated blood sugar.  Patient and daughter called regarding elevated blood sugar.  "Patient states " will make necessary corrections to his insulin pump".

## 2017-06-12 NOTE — ED Provider Notes (Signed)
[facility] Emergency Department Provider Note    First MD Initiated Contact with Patient 06/12/17 1323     (approximate)  I have reviewed the triage vital signs and the nursing notes.   HISTORY  Chief Complaint Hyperglycemia and Bradycardia    HPI Logan Strong is a 84 y.o. male resents for evaluation of hyperglycemia.  Patient noted to have complete heart block and was actually scheduled for outpatient pacemaker placement tomorrow morning.  Was having preop labs done and found to have glucose of 600.  States he does feel weak.  Was recently admitted to the hospital for pneumonia.  States his breathing has gotten better since being discharged.  Does feel weak.  Past Medical History:  Diagnosis Date  . Anemia   . Chronic kidney disease    "functioning at 50%"  . Coronary artery disease   . Diabetes mellitus, type 2 (HCC)   . Dysrhythmia   . Glaucoma   . Hypothyroidism   . Osteoporosis   . Pneumonia   . Sleep apnea    could not tolerate CPAP  . Wears dentures    partial upper and lower   No family history on file. Past Surgical History:  Procedure Laterality Date  . APPENDECTOMY    . BACK SURGERY     lumbar  . CARDIAC CATHETERIZATION  2006   stent x1  . CATARACT EXTRACTION W/PHACO Right 12/19/2016   Procedure: CATARACT EXTRACTION PHACO AND INTRAOCULAR LENS PLACEMENT (IOC) RIGHT DIABETIC;  Surgeon: Lockie Mola, MD;  Location: [facility];  Service: Ophthalmology;  Laterality: Right;  . HERNIA REPAIR    . TRABECULECTOMY Right 12/19/2016   Procedure: TRABECULECTOMY WITH [facility] AND [facility] SHUNT;  Surgeon: Lockie Mola, MD;  Location: [facility];  Service: Ophthalmology;  Laterality: Right;  Diabetic - insulin pump sleep apnea   Patient Active Problem List   Diagnosis Date Noted  . Hyperglycemia 06/12/2017  . Pneumonia 06/07/2017      Prior to Admission medications   Medication Sig Start Date End Date  Taking? Authorizing Provider  aspirin 81 MG tablet Take 81 mg by mouth daily.   Yes [provider]  brimonidine (ALPHAGAN) 0.2 % ophthalmic solution Place into the right eye 2 (two) times daily.    Yes [provider]  CALCIUM CARBONATE-VITAMIN D PO Take 1 tablet by mouth daily.    Yes [provider]  dorzolamide (TRUSOPT) 2 % ophthalmic solution Place 1 drop into the right eye 2 (two) times daily.    Yes [provider]  insulin aspart (NOVOLOG) 100 UNIT/ML injection Inject 30-50 Units into the skin daily. In pump.  Basil and bolus before each meal    Yes [provider]  insulin glargine (LANTUS) 100 UNIT/ML injection Inject 15 Units into the skin as needed.    Yes [provider]  latanoprost (XALATAN) 0.005 % ophthalmic solution Place 1 drop into the right eye at bedtime.    Yes [provider]  levofloxacin (LEVAQUIN) 750 MG tablet Take 1 tablet (750 mg total) by mouth every other day for 4 days. Patient taking differently: Take 500 mg by mouth daily.  06/09/17 06/13/17 Yes Mody, Patricia Pesa, MD  levothyroxine (SYNTHROID, LEVOTHROID) 50 MCG tablet Take 50 mcg by mouth daily before breakfast.   Yes [provider]  lisinopril (PRINIVIL,ZESTRIL) 5 MG tablet Take 5 mg by mouth daily.    Yes [provider]  Omega-3 Fatty Acids (FISH OIL PO) Take  1 capsule by mouth daily.    Yes [provider]  simvastatin (ZOCOR) 40 MG tablet Take 40 mg by mouth daily.   Yes [provider]  timolol (BETIMOL) 0.5 % ophthalmic solution Place 1 drop into both eyes 2 (two) times daily.   Yes [provider]  vitamin B-12 (CYANOCOBALAMIN) 1000 MCG tablet Take 1,000 mcg by mouth 2 (two) times daily.    Yes [provider]  diphenhydramine-acetaminophen (TYLENOL PM) 25-500 MG TABS tablet Take 1 tablet by mouth at bedtime as needed.    [provider]  permethrin (ELIMITE) 5 % cream Apply 1 application  topically daily.    [provider]    Allergies Patient has no known allergies.    Social History Social History   Tobacco Use  . Smoking status: Former Smoker    Years: 30.00    Last attempt to quit: 1971    Years since quitting: 48.2  . Smokeless tobacco: Never Used  Substance Use Topics  . Alcohol use: Never    Frequency: Never  . Drug use: Never    Review of Systems Patient denies headaches, rhinorrhea, blurry vision, numbness, shortness of breath, chest pain, edema, cough, abdominal pain, nausea, vomiting, diarrhea, dysuria, fevers, rashes or hallucinations unless otherwise stated above in HPI. ____________________________________________   PHYSICAL EXAM:  VITAL SIGNS: Vitals:   06/12/17 1400 06/12/17 1430  BP: (!) 157/55 (!) 176/58  Pulse: (!) 39 (!) 37  Resp: 15 17  Temp:    SpO2: 90% 94%    Constitutional: Alert and oriented. fatigued appearing, in no acute distress. Eyes: Conjunctivae are normal.  Head: Atraumatic. Nose: No congestion/rhinnorhea. Mouth/Throat: Mucous membranes are moist.   Neck: No stridor. Painless ROM.  Cardiovascular: bradycardic rate, regular rhythm. Grossly normal heart sounds.  Good peripheral circulation. Respiratory: Normal respiratory effort.  No retractions. Lungs CTAB. Gastrointestinal: Soft and nontender. No distention. No abdominal bruits. No CVA tenderness. Genitourinary:  Musculoskeletal: No lower extremity tenderness nor edema.  No joint effusions. Neurologic:  Normal speech and language. No gross focal neurologic deficits are appreciated. No facial droop Skin:  Skin is warm, dry and intact. No rash noted. Psychiatric: Mood and affect are normal. Speech and behavior are normal.  ____________________________________________   LABS (all labs ordered are listed, but only abnormal results are displayed)  Results for orders placed or performed during the hospital encounter of 06/12/17 (from the past 24 hour(s))   Glucose, capillary     Status: Abnormal   Collection Time: 06/12/17  1:06 PM  Result Value Ref Range   Glucose-Capillary 446 (H) 65 - 99 mg/dL  Basic metabolic panel     Status: Abnormal   Collection Time: 06/12/17  1:08 PM  Result Value Ref Range   Sodium 135 135 - 145 mmol/L   Potassium 4.5 3.5 - 5.1 mmol/L   Chloride 106 101 - 111 mmol/L   CO2 18 (L) 22 - 32 mmol/L   Glucose, Bld 502 (HH) 65 - 99 mg/dL   BUN 49 (H) 6 - 20 mg/dL   Creatinine, Ser 1.612.24 (H) 0.61 - 1.24 mg/dL   Calcium 8.7 (L) 8.9 - 10.3 mg/dL   GFR calc non Af Amer 25 (L) >60 mL/min   GFR calc Af Amer 29 (L) >60 mL/min   Anion gap 11 5 - 15  CBC     Status: Abnormal   Collection Time: 06/12/17  1:08 PM  Result Value Ref Range   WBC 6.1 3.8 -  10.6 K/uL   RBC 3.89 (L) 4.40 - 5.90 MIL/uL   Hemoglobin 11.8 (L) 13.0 - 18.0 g/dL   HCT 52.8 (L) 41.3 - 24.4 %   MCV 90.8 80.0 - 100.0 fL   MCH 30.4 26.0 - 34.0 pg   MCHC 33.4 32.0 - 36.0 g/dL   RDW 01.0 27.2 - 53.6 %   Platelets 193 150 - 440 K/uL  Glucose, capillary     Status: Abnormal   Collection Time: 06/12/17  3:13 PM  Result Value Ref Range   Glucose-Capillary 262 (H) 65 - 99 mg/dL   ____________________________________________  EKG My review and personal interpretation at Time: 12:56   Indication: bradycardia  Rate: 38  Rhythm: complete heart block Axis: normal Other: junctional escape rhythm, no stemi ____________________________________________  RADIOLOGY  I personally reviewed all radiographic images ordered to evaluate for the above acute complaints and reviewed radiology reports and findings.  These findings were personally discussed with the patient.  Please see medical record for radiology report.  ____________________________________________   PROCEDURES  Procedure(s) performed:  .Critical Care Performed by: Willy Eddy, MD Authorized by: Willy Eddy, MD   Critical care provider statement:    Critical care time (minutes):   35   Critical care time was exclusive of:  Separately billable procedures and treating other patients   Critical care was necessary to treat or prevent imminent or life-threatening deterioration of the following conditions:  Cardiac failure   Critical care was time spent personally by me on the following activities:  Development of treatment plan with patient or surrogate, discussions with consultants, evaluation of patient's response to treatment, examination of patient, obtaining history from patient or surrogate, ordering and performing treatments and interventions, ordering and review of laboratory studies, ordering and review of radiographic studies, pulse oximetry, re-evaluation of patient's condition and review of old charts      Critical Care performed: yes  ____________________________________________   INITIAL IMPRESSION / ASSESSMENT AND PLAN / ED COURSE  Pertinent labs & imaging results that were available during my care of the patient were reviewed by me and considered in my medical decision making (see chart for details).  DDX: Electrode abnormality, DKA, dehydration, ACS, complete heart block  Logan Strong is a 84 y.o. who presents to the ED with evidence of hyperglycemia and symptomatic heart block.  Pacer pads placed on patient.  Blood work sent for the above differential.  The patient will be placed on continuous pulse oximetry and telemetry for monitoring.  Laboratory evaluation will be sent to evaluate for the above complaints.     Clinical Course as of Jun 12 1517  Wed Jun 12, 2017  1518 Patient's glucose does seem to be improving.  Given his AK I as well as hyperglycemia and evidence of weakness with complete heart block do feel patient will require admission the hospital for further medical optimization prior to pacemaker placement tomorrow.  Have discussed with the patient and available family all diagnostics and treatments performed thus far and all questions were  answered to the best of my ability. The patient demonstrates understanding and agreement with plan.    [PR]    Clinical Course User Index [PR] Willy Eddy, MD     As part of my medical decision making, I reviewed the following data within the electronic MEDICAL RECORD NUMBER Nursing notes reviewed and incorporated, Labs reviewed, notes from prior ED visits and [place] Controlled Substance Database   ____________________________________________   FINAL CLINICAL IMPRESSION(S) / ED  DIAGNOSES  Final diagnoses:  Complete heart block (HCC)  Hyperglycemia  AKI (acute kidney injury) (HCC)      NEW MEDICATIONS STARTED DURING THIS VISIT:  New Prescriptions   No medications on file     Note:  This document was prepared using Dragon voice recognition software and may include unintentional dictation errors.    Willy Eddy, MD 06/12/17 [phone]

## 2017-06-12 NOTE — Pre-Procedure Instructions (Signed)
Called Dr Glennis Brinkallwood's and Dr Briscoe BurnsParaschos's office regarding blood sugar and the patient's referral to the ER.

## 2017-06-12 NOTE — Progress Notes (Signed)
Advanced care plan.  Purpose of the Encounter: CODE STATUS  Parties in Attendance:Patient and family  Patient's Decision Capacity:Good  Subjective/Patient's story: Presented to ER with brady cardia and elevated blood sugar  Objective/Medical story Has complete heart block and uncontrolled diabetes  Goals of care determination: Control blood sugars Pace maker placement Discussed cardiac resuscitation, intubation and ventilator needs if situation arises. Patient wants everything done.  CODE STATUS: Full code  Time spent discussing advanced care planning: 16 minutes

## 2017-06-12 NOTE — ED Notes (Signed)
Pt 84% on room air; pt up to 91% with deep breathing exercises.  Pt placed on 1L nasal cannula at this time.

## 2017-06-12 NOTE — H&P (Addendum)
[facility] - [place] [facility]   PATIENT NAME: Logan Strong    MR#:  [id number]  DATE OF BIRTH:  05-Feb-1932  DATE OF ADMISSION:  06/12/2017  PRIMARY CARE PHYSICIAN: Mosetta Pigeon, MD   REQUESTING/REFERRING PHYSICIAN:   CHIEF COMPLAINT:   Chief Complaint  Patient presents with  . Hyperglycemia  . Bradycardia    HISTORY OF PRESENT ILLNESS: Logan Strong  is a 84 y.o. male with a known history of type 2 diabetes mellitus, chronic kidney disease, hypothyroidism, glaucoma, osteoporosis, sleep apnea was referred to the emergency room for elevated blood sugar.Bloodsugar was greater than 600 mg/dL.Patient was due for pacemaker tomorrow by Dr. Darrold Junker.  He was having preop blood work and his sugar was elevated anion gap was normal.  Patient uses insulin pump home.  Patient was seen by the diabetic coordinator in the emergency room.  He is due for a change in his insulin pump site today blood pressure.  Recommendation was done for Lantus insulin 12 units along with sliding scale coverage.  Patient is stable, no chest pain and palpitations.  Heart rate is around mid 30s.  PAST MEDICAL HISTORY:   Past Medical History:  Diagnosis Date  . Anemia   . Chronic kidney disease    "functioning at 50%"  . Coronary artery disease   . Diabetes mellitus, type 2 (HCC)   . Dysrhythmia   . Glaucoma   . Hypothyroidism   . Osteoporosis   . Pneumonia   . Sleep apnea    could not tolerate CPAP  . Wears dentures    partial upper and lower    PAST SURGICAL HISTORY:  Past Surgical History:  Procedure Laterality Date  . APPENDECTOMY    . BACK SURGERY     lumbar  . CARDIAC CATHETERIZATION  2006   stent x1  . CATARACT EXTRACTION W/PHACO Right 12/19/2016   Procedure: CATARACT EXTRACTION PHACO AND INTRAOCULAR LENS PLACEMENT (IOC) RIGHT DIABETIC;  Surgeon: Lockie Mola, MD;  Location: [facility];  Service: Ophthalmology;  Laterality: Right;  . HERNIA REPAIR     . TRABECULECTOMY Right 12/19/2016   Procedure: TRABECULECTOMY WITH [facility] AND EXPRESS SHUNT;  Surgeon: Lockie Mola, MD;  Location: [facility];  Service: Ophthalmology;  Laterality: Right;  Diabetic - insulin pump sleep apnea    SOCIAL HISTORY:  Social History   Tobacco Use  . Smoking status: Former Smoker    Years: 30.00    Last attempt to quit: 1971    Years since quitting: 48.2  . Smokeless tobacco: Never Used  Substance Use Topics  . Alcohol use: Never    Frequency: Never    FAMILY HISTORY: No family history on file.  DRUG ALLERGIES: No Known Allergies  REVIEW OF SYSTEMS:   CONSTITUTIONAL: No fever, fatigue or weakness.  EYES: No blurred or double vision.  EARS, NOSE, AND THROAT: No tinnitus or ear pain.  RESPIRATORY: No cough, shortness of breath, wheezing or hemoptysis.  CARDIOVASCULAR: No chest pain, orthopnea, edema.  GASTROINTESTINAL: No nausea, vomiting, diarrhea or abdominal pain.  GENITOURINARY: No dysuria, hematuria.  ENDOCRINE: Has polyuria, nocturia,  HEMATOLOGY: No anemia, easy bruising or bleeding SKIN: No rash or lesion. MUSCULOSKELETAL: No joint pain or arthritis.   NEUROLOGIC: No tingling, numbness, weakness.  PSYCHIATRY: No anxiety or depression.   MEDICATIONS AT HOME:  Prior to Admission medications   Medication Sig Start Date End Date Taking? Authorizing Provider  aspirin 81 MG tablet Take 81 mg by mouth daily.  Yes [provider]  brimonidine (ALPHAGAN) 0.2 % ophthalmic solution Place into the right eye 2 (two) times daily.    Yes [provider]  CALCIUM CARBONATE-VITAMIN D PO Take 1 tablet by mouth daily.    Yes [provider]  dorzolamide (TRUSOPT) 2 % ophthalmic solution Place 1 drop into the right eye 2 (two) times daily.    Yes [provider]  insulin aspart (NOVOLOG) 100 UNIT/ML injection Inject 30-50 Units into the skin daily. In pump.  [place] and bolus before each meal    Yes  [provider]  insulin glargine (LANTUS) 100 UNIT/ML injection Inject 15 Units into the skin as needed.    Yes [provider]  latanoprost (XALATAN) 0.005 % ophthalmic solution Place 1 drop into the right eye at bedtime.    Yes [provider]  levofloxacin (LEVAQUIN) 750 MG tablet Take 1 tablet (750 mg total) by mouth every other day for 4 days. Patient taking differently: Take 500 mg by mouth daily.  06/09/17 06/13/17 Yes Mody, Patricia Pesa, MD  levothyroxine (SYNTHROID, LEVOTHROID) 50 MCG tablet Take 50 mcg by mouth daily before breakfast.   Yes [provider]  lisinopril (PRINIVIL,ZESTRIL) 5 MG tablet Take 5 mg by mouth daily.    Yes [provider]  Omega-3 Fatty Acids (FISH OIL PO) Take 1 capsule by mouth daily.    Yes [provider]  simvastatin (ZOCOR) 40 MG tablet Take 40 mg by mouth daily.   Yes [provider]  timolol (BETIMOL) 0.5 % ophthalmic solution Place 1 drop into both eyes 2 (two) times daily.   Yes [provider]  vitamin B-12 (CYANOCOBALAMIN) 1000 MCG tablet Take 1,000 mcg by mouth 2 (two) times daily.    Yes [provider]  diphenhydramine-acetaminophen (TYLENOL PM) 25-500 MG TABS tablet Take 1 tablet by mouth at bedtime as needed.    [provider]  permethrin (ELIMITE) 5 % cream Apply 1 application topically daily.    [provider]      PHYSICAL EXAMINATION:   VITAL SIGNS: Blood pressure (!) 176/58, pulse (!) 37, temperature 97.7 F (36.5 C), temperature source Oral, resp. rate 17, height 5\' 6"  (1.676 m), weight 59 kg (130 lb), SpO2 94 %.  GENERAL:  84 y.o.-year-old patient lying in the bed with no acute distress.  EYES: Pupils equal, round, reactive to light and accommodation. No scleral icterus. Extraocular muscles intact.  HEENT: Head atraumatic, normocephalic. Oropharynx and nasopharynx clear.  NECK:  Supple, no jugular venous distention. No thyroid enlargement, no  tenderness.  LUNGS: Normal breath sounds bilaterally, no wheezing, rales,rhonchi or crepitation. No use of accessory muscles of respiration.  CARDIOVASCULAR: S1, S2 bradycardia noted. No murmurs, rubs, or gallops.  ABDOMEN: Soft, nontender, nondistended. Bowel sounds present. No organomegaly or mass.  EXTREMITIES: No pedal edema, cyanosis, or clubbing.  NEUROLOGIC: Cranial nerves II through XII are intact. Muscle strength 5/5 in all extremities. Sensation intact. Gait not checked.  PSYCHIATRIC: The patient is alert and oriented x 3.  SKIN: No obvious rash, lesion, or ulcer.   LABORATORY PANEL:   CBC Recent Labs  Lab 06/07/17 2037 06/07/17 2346 06/12/17 1000 06/12/17 1308  WBC 7.9 7.3 6.8 6.1  HGB 11.7* 11.0* 11.7* 11.8*  HCT 34.8* 33.2* 36.2* 35.3*  PLT 188 185 168 193  MCV 90.7 90.4 91.9 90.8  MCH 30.5 29.9 29.7 30.4  MCHC 33.6 33.1 32.3 33.4  RDW 14.3 14.1 14.7* 14.5   ------------------------------------------------------------------------------------------------------------------  Chemistries  Recent Labs  Lab 06/07/17 2037 06/07/17 2346 06/12/17 1000 06/12/17 1308  NA 132* 134* 134* 135  K 4.7 4.2 4.8 4.5  CL 102 105 104 106  CO2 17* 20* 20* 18*  GLUCOSE 374* 306* 665* 502*  BUN 43* 43* 49* 49*  CREATININE 1.79* 1.77* 2.33* 2.24*  CALCIUM 8.7* 8.3* 8.4* 8.7*   ------------------------------------------------------------------------------------------------------------------ estimated creatinine clearance is 20.9 mL/min (A) (by C-G formula based on SCr of 2.24 mg/dL (H)). ------------------------------------------------------------------------------------------------------------------ No results for input(s): TSH, T4TOTAL, T3FREE, THYROIDAB in the last 72 hours.  Invalid input(s): FREET3   Coagulation profile Recent Labs  Lab 06/12/17 1000  INR 1.20    ------------------------------------------------------------------------------------------------------------------- No results for input(s): DDIMER in the last 72 hours. -------------------------------------------------------------------------------------------------------------------  Cardiac Enzymes Recent Labs  Lab 06/07/17 2037  TROPONINI <0.03   ------------------------------------------------------------------------------------------------------------------ Invalid input(s): POCBNP  ---------------------------------------------------------------------------------------------------------------  Urinalysis No results found for: COLORURINE, APPEARANCEUR, LABSPEC, PHURINE, GLUCOSEU, HGBUR, BILIRUBINUR, KETONESUR, PROTEINUR, UROBILINOGEN, NITRITE, LEUKOCYTESUR   RADIOLOGY: No results found.  EKG: Orders placed or performed in visit on 06/12/17  . EKG 12-Lead    IMPRESSION AND PLAN:  84 year old elderly male patient with history of chronic kidney disease, type 2 diabetes mellitus, hypothyroidism, glaucoma, osteoporosis presented to the emergency room with elevated blood sugar and bradycardia.  1.  Uncontrolled diabetes mellitus Start patient on Lantus insulin along with sliding scale coverage IV fluid hydration Patient received 2 L bolus already in the emergency room  2.  Complete heart block Cardiology consultation Pacemaker tomorrow by [facility]  3. CKD III Monitor renal function  4. Hypothyroidism Thyroid supplements  5. DVT prophylaxis with [place] heparin  All the records are reviewed and case discussed with ED provider. Management plans discussed with the patient, family and they are in agreement.  CODE STATUS:FULL CODE Code Status History    Date Active Date Inactive Code Status Order ID Comments User Context   06/07/2017 2235 06/08/2017 1509 Full Code [id number]  Milagros LollSudini, Srikar, MD ED    Advance Directive Documentation     Most Recent  Value  Type of Advance Directive  Healthcare Power of Attorney, Living will  Pre-existing out of facility DNR order (yellow form or pink MOST form)  -  "MOST" Form in Place?  -       TOTAL TIME TAKING CARE OF THIS PATIENT: 51 minutes.    Ihor AustinPavan Leatta Alewine M.D on 06/12/2017 at 2:58 PM  Between 7am to 6pm - Pager - [phone] Ascom [phone]  After 6pm go to [email or web address] - password [email or web address] [facility]  [facility]  Office  [phone]  CC: Primary care physician; Mosetta PigeonSingh, Harmeet, MD

## 2017-06-13 ENCOUNTER — Ambulatory Visit: Admission: RE | Admit: 2017-06-13 | Payer: Medicare HMO | Source: Ambulatory Visit | Admitting: Cardiology

## 2017-06-13 ENCOUNTER — Inpatient Hospital Stay: Payer: Medicare HMO

## 2017-06-13 ENCOUNTER — Inpatient Hospital Stay: Payer: Medicare HMO | Admitting: Anesthesiology

## 2017-06-13 ENCOUNTER — Encounter: Payer: Self-pay | Admitting: Certified Registered Nurse Anesthetist

## 2017-06-13 ENCOUNTER — Encounter
Admission: EM | Disposition: A | Discharge status: Home Health | Payer: Self-pay | Source: Home / Self Care | Attending: Specialist

## 2017-06-13 HISTORY — PX: PACEMAKER INSERTION: SHX728

## 2017-06-13 LAB — BASIC METABOLIC PANEL
ANION GAP: 7 (ref 5–15)
BUN: 45 mg/dL — ABNORMAL HIGH (ref 6–20)
CALCIUM: 8.1 mg/dL — AB (ref 8.9–10.3)
CO2: 20 mmol/L — AB (ref 22–32)
CREATININE: 1.77 mg/dL — AB (ref 0.61–1.24)
Chloride: 114 mmol/L — ABNORMAL HIGH (ref 101–111)
GFR calc non Af Amer: 34 mL/min — ABNORMAL LOW (ref >60)
GFR, EST AFRICAN AMERICAN: 39 mL/min — AB (ref >60)
Glucose, Bld: 126 mg/dL — ABNORMAL HIGH (ref 65–99)
Potassium: 3.8 mmol/L (ref 3.5–5.1)
SODIUM: 141 mmol/L (ref 135–145)

## 2017-06-13 LAB — CBC
HCT: 33.3 % — ABNORMAL LOW (ref 40.0–52.0)
HEMOGLOBIN: 11.4 g/dL — AB (ref 13.0–18.0)
MCH: 30.7 pg (ref 26.0–34.0)
MCHC: 34.2 g/dL (ref 32.0–36.0)
MCV: 89.7 fL (ref 80.0–100.0)
PLATELETS: 191 10*3/uL (ref 150–440)
RBC: 3.71 MIL/uL — AB (ref 4.40–5.90)
RDW: 14.2 % (ref 11.5–14.5)
WBC: 8.8 10*3/uL (ref 3.8–10.6)

## 2017-06-13 LAB — GLUCOSE, CAPILLARY
GLUCOSE-CAPILLARY: 119 mg/dL — AB (ref 65–99)
GLUCOSE-CAPILLARY: 108 mg/dL — AB (ref 65–99)
GLUCOSE-CAPILLARY: 123 mg/dL — AB (ref 65–99)
GLUCOSE-CAPILLARY: 183 mg/dL — AB (ref 65–99)
Glucose-Capillary: 216 mg/dL — ABNORMAL HIGH (ref 65–99)

## 2017-06-13 LAB — URINALYSIS, COMPLETE (UACMP) WITH MICROSCOPIC
BACTERIA UA: NONE SEEN
BILIRUBIN URINE: NEGATIVE
Glucose, UA: 150 mg/dL — AB
KETONES UR: 5 mg/dL — AB
LEUKOCYTES UA: NEGATIVE
NITRITE: NEGATIVE
PH: 5 (ref 5.0–8.0)
Protein, ur: 100 mg/dL — AB
SPECIFIC GRAVITY, URINE: 1.019 (ref 1.005–1.030)
Squamous Epithelial / LPF: NONE SEEN

## 2017-06-13 LAB — TSH: TSH: 9.043 u[IU]/mL — ABNORMAL HIGH (ref 0.350–4.500)

## 2017-06-13 SURGERY — INSERTION, CARDIAC PACEMAKER
Anesthesia: General | Site: Chest | Wound class: Clean

## 2017-06-13 MED ORDER — CEFAZOLIN SODIUM-DEXTROSE 2-4 GM/100ML-% IV SOLN
2.0000 g | INTRAVENOUS | Status: DC
  Filled 2017-06-13: qty 100

## 2017-06-13 MED ORDER — SODIUM CHLORIDE FLUSH 0.9 % IV SOLN
INTRAVENOUS | Status: AC
  Filled 2017-06-13: qty 10

## 2017-06-13 MED ORDER — HYDROCODONE-ACETAMINOPHEN 7.5-325 MG PO TABS
1.0000 | ORAL_TABLET | Freq: Once | ORAL | Status: DC | PRN
  Filled 2017-06-13: qty 1

## 2017-06-13 MED ORDER — EPHEDRINE SULFATE 50 MG/ML IJ SOLN
INTRAMUSCULAR | Status: DC | PRN
  Administered 2017-06-13 (×2): 5 mg via INTRAVENOUS

## 2017-06-13 MED ORDER — CEFAZOLIN SODIUM-DEXTROSE 2-4 GM/100ML-% IV SOLN
INTRAVENOUS | Status: AC
  Filled 2017-06-13: qty 100

## 2017-06-13 MED ORDER — SODIUM CHLORIDE 0.9 % IR SOLN
80.0000 mg | Status: DC
  Filled 2017-06-13: qty 2

## 2017-06-13 MED ORDER — SODIUM CHLORIDE 0.9 % IR SOLN
Freq: Once | Status: DC
  Filled 2017-06-13: qty 2

## 2017-06-13 MED ORDER — LIDOCAINE HCL (CARDIAC) 20 MG/ML IV SOLN
INTRAVENOUS | Status: DC | PRN
  Administered 2017-06-13: 40 mg via INTRAVENOUS

## 2017-06-13 MED ORDER — ONDANSETRON HCL 4 MG/2ML IJ SOLN
INTRAMUSCULAR | Status: DC | PRN
  Administered 2017-06-13: 4 mg via INTRAVENOUS

## 2017-06-13 MED ORDER — CEFAZOLIN SODIUM-DEXTROSE 2-3 GM-%(50ML) IV SOLR
INTRAVENOUS | Status: DC | PRN
  Administered 2017-06-13: 2 g via INTRAVENOUS

## 2017-06-13 MED ORDER — SODIUM CHLORIDE 0.9 % IV SOLN: INTRAVENOUS | Status: DC

## 2017-06-13 MED ORDER — PROMETHAZINE HCL 25 MG/ML IJ SOLN
6.2500 mg | INTRAMUSCULAR | Status: DC | PRN
Start: 2017-06-13 — End: 2017-06-13

## 2017-06-13 MED ORDER — MIDAZOLAM HCL 2 MG/2ML IJ SOLN
INTRAMUSCULAR | Status: AC
  Filled 2017-06-13: qty 2

## 2017-06-13 MED ORDER — PROPOFOL 500 MG/50ML IV EMUL
INTRAVENOUS | Status: DC | PRN
  Administered 2017-06-13: 50 ug/kg/min via INTRAVENOUS

## 2017-06-13 MED ORDER — LIDOCAINE HCL (PF) 1 % IJ SOLN
INTRAMUSCULAR | Status: AC
Start: 2017-06-13 — End: ?
  Filled 2017-06-13: qty 30

## 2017-06-13 MED ORDER — CEFAZOLIN SODIUM-DEXTROSE 1-4 GM/50ML-% IV SOLN
1.0000 g | Freq: Four times a day (QID) | INTRAVENOUS | Status: AC
  Administered 2017-06-13 – 2017-06-14 (×3): 1 g via INTRAVENOUS
  Filled 2017-06-13 (×3): qty 50

## 2017-06-13 MED ORDER — FAMOTIDINE 20 MG PO TABS
20.0000 mg | ORAL_TABLET | Freq: Once | ORAL | Status: AC
  Administered 2017-06-13: 20 mg via ORAL
  Filled 2017-06-13: qty 1

## 2017-06-13 MED ORDER — FENTANYL CITRATE (PF) 100 MCG/2ML IJ SOLN: 25.0000 ug | INTRAMUSCULAR | Status: DC | PRN

## 2017-06-13 MED ORDER — LACTATED RINGERS IV SOLN
INTRAVENOUS | Status: DC | PRN
  Administered 2017-06-13: 12:00:00 via INTRAVENOUS

## 2017-06-13 MED ORDER — DEXTROSE-NACL 5-0.9 % IV SOLN
INTRAVENOUS | Status: DC
  Administered 2017-06-13: 09:00:00 via INTRAVENOUS

## 2017-06-13 MED ORDER — ONDANSETRON HCL 4 MG/2ML IJ SOLN: 4.0000 mg | Freq: Four times a day (QID) | INTRAMUSCULAR | Status: DC | PRN

## 2017-06-13 MED ORDER — GENTAMICIN SULFATE 40 MG/ML IJ SOLN
INTRAMUSCULAR | Status: AC
  Filled 2017-06-13: qty 2

## 2017-06-13 MED ORDER — ACETAMINOPHEN 325 MG PO TABS: 325.0000 mg | ORAL_TABLET | ORAL | Status: DC | PRN

## 2017-06-13 MED ORDER — LIDOCAINE 1 % OPTIME INJ - NO CHARGE
INTRAMUSCULAR | Status: DC | PRN
  Administered 2017-06-13: 30 mL

## 2017-06-13 MED ORDER — SODIUM CHLORIDE 0.9% FLUSH
3.0000 mL | Freq: Two times a day (BID) | INTRAVENOUS | Status: DC
  Administered 2017-06-13 – 2017-06-16 (×5): 3 mL via INTRAVENOUS

## 2017-06-13 MED ORDER — SODIUM CHLORIDE 0.9 % IV SOLN
INTRAVENOUS | Status: DC | PRN
  Administered 2017-06-13: 13:00:00

## 2017-06-13 SURGICAL SUPPLY — 38 items
BAG DECANTER FOR FLEXI CONT (MISCELLANEOUS) ×3 IMPLANT
BRUSH SCRUB EZ  4% CHG (MISCELLANEOUS) ×2
BRUSH SCRUB EZ 4% CHG (MISCELLANEOUS) ×1 IMPLANT
CABLE SURG 12 DISP A/V CHANNEL (MISCELLANEOUS) ×6 IMPLANT
CANISTER SUCT 1200ML W/VALVE (MISCELLANEOUS) ×3 IMPLANT
CHLORAPREP W/TINT 26ML (MISCELLANEOUS) ×6 IMPLANT
COVER LIGHT HANDLE STERIS (MISCELLANEOUS) ×6 IMPLANT
COVER MAYO STAND STRL (DRAPES) ×3 IMPLANT
DRAPE C-ARM XRAY 36X54 (DRAPES) ×3 IMPLANT
DRSG TEGADERM 4X4.75 (GAUZE/BANDAGES/DRESSINGS) ×3 IMPLANT
DRSG TELFA 4X3 1S NADH ST (GAUZE/BANDAGES/DRESSINGS) ×3 IMPLANT
ELECT REM PT RETURN 9FT ADLT (ELECTROSURGICAL) ×3
ELECTRODE REM PT RTRN 9FT ADLT (ELECTROSURGICAL) ×1 IMPLANT
GLOVE BIO SURGEON STRL SZ7.5 (GLOVE) ×3 IMPLANT
GLOVE BIO SURGEON STRL SZ8 (GLOVE) ×3 IMPLANT
GLOVE BIOGEL PI IND STRL 7.0 (GLOVE) ×5 IMPLANT
GLOVE BIOGEL PI INDICATOR 7.0 (GLOVE) ×10
GOWN STRL REUS W/ TWL LRG LVL3 (GOWN DISPOSABLE) ×2 IMPLANT
GOWN STRL REUS W/ TWL XL LVL3 (GOWN DISPOSABLE) ×1 IMPLANT
GOWN STRL REUS W/TWL LRG LVL3 (GOWN DISPOSABLE) ×4
GOWN STRL REUS W/TWL XL LVL3 (GOWN DISPOSABLE) ×2
IMMOBILIZER SHDR MD LX WHT (SOFTGOODS) ×3 IMPLANT
IMMOBILIZER SHDR XL LX WHT (SOFTGOODS) IMPLANT
INTRO PACEMKR SHEATH II 7FR (MISCELLANEOUS) ×3
INTRODUCER PACEMKR SHTH II 7FR (MISCELLANEOUS) ×1 IMPLANT
IPG PACE AZUR XT DR MRI W1DR01 (Pacemaker) ×1 IMPLANT
IV NS 500ML (IV SOLUTION) ×2
IV NS 500ML BAXH (IV SOLUTION) ×1 IMPLANT
KIT TURNOVER KIT A (KITS) ×3 IMPLANT
LABEL OR SOLS (LABEL) ×3 IMPLANT
LEAD CAPSURE NOVUS 5076-52CM (Lead) ×3 IMPLANT
LEAD INTRODUCER 9FR 23CM (INTRODUCER) ×3 IMPLANT
MARKER SKIN DUAL TIP RULER LAB (MISCELLANEOUS) ×3 IMPLANT
PACE AZURE XT DR MRI W1DR01 (Pacemaker) ×3 IMPLANT
PACEMAKER LEAD ATRL (Lead) ×3 IMPLANT
PACK PACE INSERTION (MISCELLANEOUS) ×3 IMPLANT
PAD ONESTEP ZOLL R SERIES ADT (MISCELLANEOUS) ×3 IMPLANT
SUT SILK 0 SH 30 (SUTURE) ×3 IMPLANT

## 2017-06-13 NOTE — Op Note (Signed)
[facility]   06/13/2017                     1:29 PM  PATIENT:  Logan Strong    PRE-OPERATIVE DIAGNOSIS:  bradycardia  POST-OPERATIVE DIAGNOSIS:  Same  PROCEDURE:  INSERTION PACEMAKER INITIAL DUAL CHAMBER INSERTION  SURGEON:  Marcina MillardAlexander Cristabel Bicknell, MD    ANESTHESIA:     PREOPERATIVE INDICATIONS:  Logan Strong is a  84 y.o. male with a diagnosis of bradycardia who failed conservative measures and elected for surgical management.    The risks benefits and alternatives were discussed with the patient preoperatively including but not limited to the risks of infection, bleeding, cardiopulmonary complications, the need for revision surgery, among others, and the patient was willing to proceed.   OPERATIVE PROCEDURE: The patient was brought to the operating room fasting state.  The left pectoral region was prepped and draped in usual sterile manner.  Anesthesia was obtained 1% lidocaine locally.  A 6 cm incision was performed the left pectoral region.  Pacemaker pocket was generated by electrocautery and blunt dissection.  Access was obtained to the left subclavian vein by fine-needle aspiration.  MRI compatible leads were positioned to the right ventricular apical septum ( Medtronic [id number] ) and right atrial appendage ( Medtronic [id number] ) under fluoroscopic guidance.  After proper thresholds were obtained the leads were sutured in place.  The leads were connected to a MRI compatible dual chamber rate responsive pacemaker generator ( Medtronic [id number] ).  Pacemaker pocket was irrigated gentamicin solution.  The pacemaker generator was positioned into the pocket and the pocket was closed with 2-0 and 4-0 Vicryl, respectively.  Steri-Strips were pressure dressing were applied.  There were no periprocedural complications.  Postprocedural interrogation revealed appropriate atrial and ventricular sensing and pacing thresholds.

## 2017-06-13 NOTE — Care Management (Signed)
Patient presented to ED from pre op for hyperglycemia.  Was scheduled for permanent pacemaker.  This was performed today without complications.

## 2017-06-13 NOTE — Plan of Care (Signed)
  Problem: Education: Goal: Knowledge of General Education information will improve Outcome: Progressing   Problem: Health Behavior/Discharge Planning: Goal: Ability to manage health-related needs will improve Outcome: Progressing   Problem: Clinical Measurements: Goal: Ability to maintain clinical measurements within normal limits will improve Outcome: Progressing Goal: Will remain free from infection Outcome: Progressing Goal: Diagnostic test results will improve Outcome: Progressing Goal: Respiratory complications will improve Outcome: Progressing Goal: Cardiovascular complication will be avoided Outcome: Progressing   Problem: Activity: Goal: Risk for activity intolerance will decrease Outcome: Progressing   Problem: Nutrition: Goal: Adequate nutrition will be maintained Outcome: Progressing   Problem: Coping: Goal: Level of anxiety will decrease Outcome: Progressing   Problem: Elimination: Goal: Will not experience complications related to bowel motility Outcome: Progressing Goal: Will not experience complications related to urinary retention Outcome: Progressing   Problem: Pain Managment: Goal: General experience of comfort will improve Outcome: Progressing   Problem: Safety: Goal: Ability to remain free from injury will improve Outcome: Progressing   Problem: Skin Integrity: Goal: Risk for impaired skin integrity will decrease Outcome: Progressing   Problem: Cardiac: Goal: Ability to achieve and maintain adequate cardiopulmonary perfusion will improve Outcome: Progressing   

## 2017-06-13 NOTE — Transfer of Care (Signed)
Immediate Anesthesia Transfer of Care Note  Patient: Logan Strong  Procedure(s) Performed: INSERTION PACEMAKER INITIAL DUAL CHAMBER INSERTION (N/A Chest)  Patient Location: PACU  Anesthesia Type:MAC  Level of Consciousness: awake, alert  and oriented  Airway & Oxygen Therapy: Patient Spontanous Breathing and Patient connected to nasal cannula oxygen  Post-op Assessment: Report given to RN and Post -op Vital signs reviewed and stable  Post vital signs: stable  Last Vitals:  Vitals Value Taken Time  BP    Temp    Pulse    Resp    SpO2      Last Pain:  Vitals:   06/13/17 1136  TempSrc: Tympanic  PainSc: 0-No pain         Complications: No apparent anesthesia complications

## 2017-06-13 NOTE — Progress Notes (Signed)
Patient remained in asymptomatic  third degree heart block without any acute event overnight. Heart rate remained in the mid 30s to low 40s, with VS WDL for patient No c/o pain, NPO at midnight for possible PPM in AM. Patient and family educated about care plan. Daughter at the bedside.

## 2017-06-13 NOTE — Progress Notes (Signed)
Inpatient Diabetes Program Recommendations  AACE/ADA: New Consensus Statement on Inpatient Glycemic Control (2015)  Target Ranges:  Prepandial:   less than 140 mg/dL      Peak postprandial:   less than 180 mg/dL (1-2 hours)      Critically ill patients:  140 - 180 mg/dL   Lab Results  Component Value Date   GLUCAP 239 (H) 06/12/2017   HGBA1C 6.3 (H) 06/07/2017    Review of Glycemic ControlResults for [name], Moo D (MRN [id number]) as of 06/13/2017 08:25  Ref. Range 06/12/2017 13:06 06/12/2017 15:13 06/12/2017 18:52 06/12/2017 20:41 06/12/2017 22:26  Glucose-Capillary Latest Ref Range: 65 - 99 mg/dL 409446 (H) 811262 (H) 914288 (H) 308 (H) 239 (H)    Diabetes history: Type 1 Dm Outpatient Diabetes medications:  Insulin pump Medtronic insulin pump Settings per last endocrinologist appointment on 03/2817 are:   Medtronic 630G Off pump plan 12 units Lantus daily. Basal: MN 0.2; 3:30 am 0.6; 4am 0.525; 1130am 0.55; 4pm 0.6; 10:30 0.5 change to: MN 0.2; 330am0.5; 6a0.6; 4p 0.625; 1030p 0.5 I/C: MN 1:10 Insulin On Board: 4 hrs ISF: 45 BG target 110-140 TDD Basal: 12. units   Current orders for Inpatient glycemic control: Lantus 12 units daily,  Novolog moderate tid with meals and HS  Inpatient Diabetes Program Recommendations:    Note that insulin pump stopped and Lantus/Novolog started on 06/12/17.  Based on home insulin pump settings, consider reducing Novolog correction to sensitive tid with meals.  Also patient will need Novolog meal coverage with meals.  Consider adding Novolog meal coverage 3 units tid with meals (Hold if patient NPO or if patient eats less than 50%). Spoke with RN.  She states that morning blood sugar=108 mg/dL.  Patient is NPO for pacemaker this morning.   Thanks,  Beryl MeagerJenny Ugochukwu Chichester, RN, BC-ADM Inpatient Diabetes Coordinator Pager [phone] (8a-5p)

## 2017-06-13 NOTE — Anesthesia Post-op Follow-up Note (Signed)
Anesthesia QCDR form completed.        

## 2017-06-13 NOTE — Anesthesia Preprocedure Evaluation (Addendum)
Anesthesia Evaluation  Patient identified by MRN, date of birth, ID band Patient awake    Reviewed: Allergy & Precautions, H&P , NPO status , reviewed documented beta blocker date and time   Airway Mallampati: II  TM Distance: >3 FB Neck ROM: full    Dental  (+) Missing, Poor Dentition   Pulmonary sleep apnea and Continuous Positive Airway Pressure Ventilation , pneumonia, resolved, former smoker,    Pulmonary exam normal        Cardiovascular + CAD  + dysrhythmias  Rate:Bradycardia  Complete heart block, bradycardia   Neuro/Psych negative neurological ROS  negative psych ROS   GI/Hepatic Neg liver ROS,   Endo/Other  diabetes, Poorly Controlled, Insulin DependentHypothyroidism   Renal/GU CRFRenal disease     Musculoskeletal   Abdominal   Peds  Hematology  (+) anemia ,   Anesthesia Other Findings Few lower teeth  Reproductive/Obstetrics                            Anesthesia Physical Anesthesia Plan  ASA: III  Anesthesia Plan: General   Post-op Pain Management:    Induction:   PONV Risk Score and Plan: Propofol infusion  Airway Management Planned:   Additional Equipment:   Intra-op Plan:   Post-operative Plan:   Informed Consent: I have reviewed the patients History and Physical, chart, labs and discussed the procedure including the risks, benefits and alternatives for the proposed anesthesia with the patient or authorized representative who has indicated his/her understanding and acceptance.   Dental Advisory Given  Plan Discussed with: CRNA  Anesthesia Plan Comments:         Anesthesia Quick Evaluation

## 2017-06-14 ENCOUNTER — Encounter: Payer: Self-pay | Admitting: Cardiology

## 2017-06-14 ENCOUNTER — Other Ambulatory Visit: Payer: Self-pay

## 2017-06-14 LAB — T4, FREE: Free T4: 0.81 ng/dL (ref 0.61–1.12)

## 2017-06-14 LAB — GLUCOSE, CAPILLARY
GLUCOSE-CAPILLARY: 372 mg/dL — AB (ref 65–99)
Glucose-Capillary: 244 mg/dL — ABNORMAL HIGH (ref 65–99)
Glucose-Capillary: 266 mg/dL — ABNORMAL HIGH (ref 65–99)
Glucose-Capillary: 181 mg/dL — ABNORMAL HIGH (ref 65–99)
Glucose-Capillary: 179 mg/dL — ABNORMAL HIGH (ref 65–99)

## 2017-06-14 MED ORDER — LEVOTHYROXINE SODIUM 75 MCG PO TABS
75.0000 ug | ORAL_TABLET | Freq: Every day | ORAL | Status: DC
  Administered 2017-06-15 – 2017-06-16 (×3): 75 ug via ORAL
  Filled 2017-06-14 (×2): qty 3
  Filled 2017-06-14 (×2): qty 1

## 2017-06-14 MED ORDER — FUROSEMIDE 10 MG/ML IJ SOLN
40.0000 mg | Freq: Two times a day (BID) | INTRAMUSCULAR | Status: DC
  Administered 2017-06-14 (×2): 40 mg via INTRAVENOUS
  Filled 2017-06-14 (×2): qty 4

## 2017-06-14 MED ORDER — IPRATROPIUM-ALBUTEROL 0.5-2.5 (3) MG/3ML IN SOLN
3.0000 mL | RESPIRATORY_TRACT | Status: DC | PRN
  Administered 2017-06-14: 3 mL via RESPIRATORY_TRACT
  Filled 2017-06-14: qty 3

## 2017-06-14 MED ORDER — INSULIN ASPART 100 UNIT/ML ~~LOC~~ SOLN
4.0000 [IU] | Freq: Three times a day (TID) | SUBCUTANEOUS | Status: DC
Start: 2017-06-14 — End: 2017-06-16
  Administered 2017-06-14 – 2017-06-16 (×6): 4 [IU] via SUBCUTANEOUS
  Filled 2017-06-14 (×6): qty 1

## 2017-06-14 MED ORDER — INSULIN ASPART 100 UNIT/ML ~~LOC~~ SOLN
0.0000 [IU] | Freq: Three times a day (TID) | SUBCUTANEOUS | Status: DC
  Administered 2017-06-14: 2 [IU] via SUBCUTANEOUS
  Administered 2017-06-15: 9 [IU] via SUBCUTANEOUS
  Administered 2017-06-15: 3 [IU] via SUBCUTANEOUS
  Administered 2017-06-16: 2 [IU] via SUBCUTANEOUS
  Administered 2017-06-16 (×2): 3 [IU] via SUBCUTANEOUS
  Filled 2017-06-14 (×6): qty 1

## 2017-06-14 MED ORDER — FUROSEMIDE 10 MG/ML IJ SOLN: 40.0000 mg | Freq: Once | INTRAMUSCULAR | Status: DC

## 2017-06-14 NOTE — Progress Notes (Signed)
[employer] - [place] at [facility]   PATIENT NAME: Logan Strong    MR#:  [id number]  DATE OF BIRTH:  03-12-1932  SUBJECTIVE:  CHIEF COMPLAINT:   Chief Complaint  Patient presents with  . Hyperglycemia  . Bradycardia    Dizziness and weakness, noted to be bradycardiac and Hyperglycemic.  s/p pace maker.  REVIEW OF SYSTEMS:  CONSTITUTIONAL: No fever,positive for fatigue or weakness.  EYES: No blurred or double vision.  EARS, NOSE, AND THROAT: No tinnitus or ear pain.  RESPIRATORY: No cough, shortness of breath, wheezing or hemoptysis.  CARDIOVASCULAR: No chest pain, orthopnea, edema.  GASTROINTESTINAL: No nausea, vomiting, diarrhea or abdominal pain.  GENITOURINARY: No dysuria, hematuria.  ENDOCRINE: No polyuria, nocturia,  HEMATOLOGY: No anemia, easy bruising or bleeding SKIN: No rash or lesion. MUSCULOSKELETAL: No joint pain or arthritis.   NEUROLOGIC: No tingling, numbness, weakness.  PSYCHIATRY: No anxiety or depression.   ROS  DRUG ALLERGIES:  No Known Allergies  VITALS:  Blood pressure (!) 154/51, pulse (!) 59, temperature 98 F (36.7 C), resp. rate (!) 28, height 5\' 6"  (1.676 m), weight 65.4 kg (144 lb 3.2 oz), SpO2 90 %.  PHYSICAL EXAMINATION:  GENERAL:  84 y.o.-year-old patient lying in the bed with no acute distress.  EYES: Pupils equal, round, reactive to light and accommodation. No scleral icterus. Extraocular muscles intact.  HEENT: Head atraumatic, normocephalic. Oropharynx and nasopharynx clear.  NECK:  Supple, no jugular venous distention. No thyroid enlargement, no tenderness.  LUNGS: Normal breath sounds bilaterally, no wheezing, rales,rhonchi or crepitation. No use of accessory muscles of respiration.  CARDIOVASCULAR: S1, S2 normal. No murmurs, rubs, or gallops.  ABDOMEN: Soft, nontender, nondistended. Bowel sounds present. No organomegaly or mass.  EXTREMITIES: No pedal edema, cyanosis, or clubbing.  NEUROLOGIC: Cranial nerves II  through XII are intact. Muscle strength 5/5 in all extremities. Sensation intact. Gait not checked.  PSYCHIATRIC: The patient is alert and oriented x 3.  SKIN: No obvious rash, lesion, or ulcer.   Physical Exam LABORATORY PANEL:   CBC Recent Labs  Lab 06/13/17 0455  WBC 8.8  HGB 11.4*  HCT 33.3*  PLT 191   ------------------------------------------------------------------------------------------------------------------  Chemistries  Recent Labs  Lab 06/13/17 0455  NA 141  K 3.8  CL 114*  CO2 20*  GLUCOSE 126*  BUN 45*  CREATININE 1.77*  CALCIUM 8.1*   ------------------------------------------------------------------------------------------------------------------  Cardiac Enzymes Recent Labs  Lab 06/07/17 2037  TROPONINI <0.03   ------------------------------------------------------------------------------------------------------------------  RADIOLOGY:  Dg Chest 2 View  Result Date: 06/12/2017 CLINICAL DATA:  Preoperative evaluation for pacemaker placement EXAM: CHEST - 2 VIEW COMPARISON:  06/07/2017 FINDINGS: Normal heart size, mediastinal contours, and pulmonary vascularity. Atherosclerotic calcification aorta. Chronic bronchitic changes and diffuse interstitial prominence stable. Slightly improved RIGHT upper lobe infiltrate. Mild bibasilar atelectasis and small pleural effusions. No pneumothorax. No acute osseous findings. IMPRESSION: Slightly improved RIGHT upper lobe infiltrate. Chronic bronchitic and diffuse interstitial changes with new mild bibasilar atelectasis and small pleural effusions. Electronically Signed   By: Ulyses Southward M.D.   On: 06/12/2017 16:06   Dg Chest Port 1 View  Result Date: 06/13/2017 CLINICAL DATA:  Post pacemaker insertion EXAM: PORTABLE CHEST 1 VIEW COMPARISON:  Portable exam 1338 hours compared to 06/12/2017 FINDINGS: New LEFT subclavian transvenous pacemaker with leads projecting over RIGHT atrium and RIGHT ventricle. External pacing  leads present. Normal heart size and mediastinal contours. Atherosclerotic calcification aorta. Increased perihilar infiltrates likely pulmonary edema. No gross pleural effusion or pneumothorax. IMPRESSION:  Pacemaker leads project over RIGHT atrium and RIGHT ventricle. Increased pulmonary edema. Electronically Signed   By: Ulyses SouthwardMark  Boles M.D.   On: 06/13/2017 13:52   Dg C-arm 1-60 Min-no Report  Result Date: 06/13/2017 Fluoroscopy was utilized by the requesting physician.  No radiographic interpretation.    ASSESSMENT AND PLAN:   Active Problems:   Hyperglycemia  84 year old elderly male patient with history of chronic kidney disease, type 2 diabetes mellitus, hypothyroidism, glaucoma, osteoporosis presented to the emergency room with elevated blood sugar and bradycardia.  1.  Uncontrolled diabetes mellitus Start patient on Lantus insulin along with sliding scale coverage IV fluid hydration It was his mistake on reading blood sugar level, now stable. Uses insuline pump at home. May resume tomorrow.  2.  Complete heart block Cardiology consultation Pacemaker placed.  check TSH.  3. CKD III Monitor renal function- improved some  4. Hypothyroidism Thyroid supplements, check TSH  5. DVT prophylaxis with [place] heparin    All the records are reviewed and case discussed with Care Management/Social Workerr. Management plans discussed with the patient, family and they are in agreement.  CODE STATUS: Full.  TOTAL TIME TAKING CARE OF THIS PATIENT: 35 minutes.     POSSIBLE D/C IN 1-2 DAYS, DEPENDING ON CLINICAL CONDITION.   Altamese DillingVaibhavkumar Aicha Clingenpeel M.D on 06/14/2017   Between 7am to 6pm - Pager - [phone]  After 6pm go to [email or web address] - password [email or web address]  [facility] [place] [place]  Office  [phone]  CC: Primary care physician; Mosetta PigeonSingh, Harmeet, MD  Note: This dictation was prepared with Dragon dictation along with smaller phrase technology. Any transcriptional  errors that result from this process are unintentional.

## 2017-06-14 NOTE — Anesthesia Postprocedure Evaluation (Signed)
Anesthesia Post Note  Patient: Logan Strong  Procedure(s) Performed: INSERTION PACEMAKER INITIAL DUAL CHAMBER INSERTION (N/A Chest)  Patient location during evaluation: PACU Anesthesia Type: General Level of consciousness: awake and alert Pain management: pain level controlled Vital Signs Assessment: post-procedure vital signs reviewed and stable Respiratory status: spontaneous breathing, nonlabored ventilation, respiratory function stable and patient connected to nasal cannula oxygen Cardiovascular status: blood pressure returned to baseline and stable Postop Assessment: no apparent nausea or vomiting Anesthetic complications: no     Last Vitals:  Vitals:   06/14/17 0452 06/14/17 0752  BP:  (!) 154/51  Pulse: 97 (!) 59  Resp: (!) 28   Temp:    SpO2: 91% 90%    Last Pain:  Vitals:   06/14/17 0752  TempSrc:   PainSc: 0-No pain                 Christia ReadingScott T Melannie Metzner

## 2017-06-14 NOTE — Progress Notes (Signed)
[employer] - [place] at [facility]   PATIENT NAME: Logan Strong    MR#:  [id number]  DATE OF BIRTH:  December 28, 1931  SUBJECTIVE:  CHIEF COMPLAINT:   Chief Complaint  Patient presents with  . Hyperglycemia  . Bradycardia    Dizziness and weakness, noted to be bradycardiac and Hyperglycemic.  s/p pace maker. Hypoxia last night and required increasing oxygen.  Noted to have some fluid overload.  REVIEW OF SYSTEMS:  CONSTITUTIONAL: No fever,positive for fatigue or weakness.  EYES: No blurred or double vision.  EARS, NOSE, AND THROAT: No tinnitus or ear pain.  RESPIRATORY: No cough, shortness of breath, wheezing or hemoptysis.  CARDIOVASCULAR: No chest pain, orthopnea, edema.  GASTROINTESTINAL: No nausea, vomiting, diarrhea or abdominal pain.  GENITOURINARY: No dysuria, hematuria.  ENDOCRINE: No polyuria, nocturia,  HEMATOLOGY: No anemia, easy bruising or bleeding SKIN: No rash or lesion. MUSCULOSKELETAL: No joint pain or arthritis.   NEUROLOGIC: No tingling, numbness, weakness.  PSYCHIATRY: No anxiety or depression.   ROS  DRUG ALLERGIES:  No Known Allergies  VITALS:  Blood pressure (!) 136/59, pulse 62, temperature 99 F (37.2 C), temperature source Oral, resp. rate 17, height 5\' 6"  (1.676 m), weight 65.4 kg (144 lb 3.2 oz), SpO2 92 %.  PHYSICAL EXAMINATION:  GENERAL:  84 y.o.-year-old patient lying in the bed with no acute distress.  EYES: Pupils equal, round, reactive to light and accommodation. No scleral icterus. Extraocular muscles intact.  HEENT: Head atraumatic, normocephalic. Oropharynx and nasopharynx clear.  NECK:  Supple, no jugular venous distention. No thyroid enlargement, no tenderness.  LUNGS: Normal breath sounds bilaterally, no wheezing, bilateral crepitation. No use of accessory muscles of respiration.  CARDIOVASCULAR: S1, S2 normal. No murmurs, rubs, or gallops.  ABDOMEN: Soft, nontender, nondistended. Bowel sounds present. No  organomegaly or mass.  EXTREMITIES: No pedal edema, cyanosis, or clubbing.  NEUROLOGIC: Cranial nerves II through XII are intact. Muscle strength 4/5 in all extremities. Sensation intact. Gait not checked.  PSYCHIATRIC: The patient is alert and oriented x 3.  SKIN: No obvious rash, lesion, or ulcer.   Physical Exam LABORATORY PANEL:   CBC Recent Labs  Lab 06/13/17 0455  WBC 8.8  HGB 11.4*  HCT 33.3*  PLT 191   ------------------------------------------------------------------------------------------------------------------  Chemistries  Recent Labs  Lab 06/13/17 0455  NA 141  K 3.8  CL 114*  CO2 20*  GLUCOSE 126*  BUN 45*  CREATININE 1.77*  CALCIUM 8.1*   ------------------------------------------------------------------------------------------------------------------  Cardiac Enzymes No results for input(s): TROPONINI in the last 168 hours. ------------------------------------------------------------------------------------------------------------------  RADIOLOGY:  Dg Chest Port 1 View  Result Date: 06/13/2017 CLINICAL DATA:  Post pacemaker insertion EXAM: PORTABLE CHEST 1 VIEW COMPARISON:  Portable exam 1338 hours compared to 06/12/2017 FINDINGS: New LEFT subclavian transvenous pacemaker with leads projecting over RIGHT atrium and RIGHT ventricle. External pacing leads present. Normal heart size and mediastinal contours. Atherosclerotic calcification aorta. Increased perihilar infiltrates likely pulmonary edema. No gross pleural effusion or pneumothorax. IMPRESSION: Pacemaker leads project over RIGHT atrium and RIGHT ventricle. Increased pulmonary edema. Electronically Signed   By: Ulyses Southward M.D.   On: 06/13/2017 13:52   Dg C-arm 1-60 Min-no Report  Result Date: 06/13/2017 Fluoroscopy was utilized by the requesting physician.  No radiographic interpretation.    ASSESSMENT AND PLAN:   Active Problems:   Hyperglycemia  84 year old elderly male patient with  history of chronic kidney disease, type 2 diabetes mellitus, hypothyroidism, glaucoma, osteoporosis presented to the emergency room with elevated blood sugar  and bradycardia.  1.  Uncontrolled diabetes mellitus Start patient on Lantus insulin along with sliding scale coverage IV fluid hydration It was his mistake on reading blood sugar level, now stable. Uses insuline pump at home. May resume tomorrow.  2.  Complete heart block Cardiology consultation Pacemaker placed.  checked TSH.  3. CKD III Monitor renal function- improved some  4. Hypothyroidism Thyroid supplements, checked TSH-high, increased levothyroxine dose.  5. DVT prophylaxis with [place] heparin  6.  Acute congestive heart failure    IV Lasix, echocardiogram, it was initiated help by cardiology. All the records are reviewed and case discussed with Care Management/Social Workerr. Management plans discussed with the patient, family and they are in agreement.  CODE STATUS: Full.  TOTAL TIME TAKING CARE OF THIS PATIENT: 35 minutes.     POSSIBLE D/C IN 1-2 DAYS, DEPENDING ON CLINICAL CONDITION.   Altamese DillingVaibhavkumar Jalila Goodnough M.D on 06/14/2017   Between 7am to 6pm - Pager - [phone]  After 6pm go to [email or web address] - password [email or web address]  [facility] [place] [facility]  Office  [phone]  CC: Primary care physician; Mosetta PigeonSingh, Harmeet, MD  Note: This dictation was prepared with Dragon dictation along with smaller phrase technology. Any transcriptional errors that result from this process are unintentional.

## 2017-06-14 NOTE — Consult Note (Signed)
[facility] Cardiology  CARDIOLOGY CONSULT NOTE  Patient ID: Logan Strong MRN: [id number] DOB/AGE: 82-07-1931 82 y.o.  Admit date: 06/12/2017 Referring Physician Pyreddy Primary Physician Thedore Mins Primary Cardiologist [facility] Reason for Consultation congestive heart failure  HPI: 84 year old gentleman referred for congestive heart failure.  The patient was recently noted to be bradycardic, found to be in complete heart block, and was scheduled for elective dual-chamber pacemaker on 06/13/2017.  However, the patient was admitted on 06/12/2017 with markedly elevated blood sugars greater than 600 milligrams per deciliter.  The patient underwent permanent pacemaker implantation on 06/13/2017 without complication. Telemetry reveals appropriate atrial sensing and ventricular pacing. However, patient developed congestive heart failure with hypoxia.   Review of systems complete and found to be negative unless listed above     Past Medical History:  Diagnosis Date  . Anemia   . Chronic kidney disease    "functioning at 50%"  . Coronary artery disease   . Diabetes mellitus, type 2 (HCC)   . Dysrhythmia   . Glaucoma   . Hypothyroidism   . Osteoporosis   . Pneumonia   . Sleep apnea    could not tolerate CPAP  . Wears dentures    partial upper and lower    Past Surgical History:  Procedure Laterality Date  . APPENDECTOMY    . BACK SURGERY     lumbar  . CARDIAC CATHETERIZATION  2006   stent x1  . CATARACT EXTRACTION W/PHACO Right 12/19/2016   Procedure: CATARACT EXTRACTION PHACO AND INTRAOCULAR LENS PLACEMENT (IOC) RIGHT DIABETIC;  Surgeon: Lockie Mola, MD;  Location: [facility];  Service: Ophthalmology;  Laterality: Right;  . HERNIA REPAIR    . TRABECULECTOMY Right 12/19/2016   Procedure: TRABECULECTOMY WITH [facility] AND EXPRESS SHUNT;  Surgeon: Lockie Mola, MD;  Location: [facility];  Service: Ophthalmology;  Laterality: Right;  Diabetic - insulin pump sleep apnea     Medications Prior to Admission  Medication Sig Dispense Refill Last Dose  . aspirin 81 MG tablet Take 81 mg by mouth daily.   06/12/2017 at Unknown time  . brimonidine (ALPHAGAN) 0.2 % ophthalmic solution Place into the right eye 2 (two) times daily.    06/12/2017 at Unknown time  . CALCIUM CARBONATE-VITAMIN D PO Take 1 tablet by mouth daily.    06/12/2017 at Unknown time  . dorzolamide (TRUSOPT) 2 % ophthalmic solution Place 1 drop into the right eye 2 (two) times daily.    06/07/2017 at 1200  . insulin aspart (NOVOLOG) 100 UNIT/ML injection Inject 30-50 Units into the skin daily. In pump.  [place] and bolus before each meal    UNknown at Unknown  . insulin glargine (LANTUS) 100 UNIT/ML injection Inject 15 Units into the skin as needed.    prn at prn  . latanoprost (XALATAN) 0.005 % ophthalmic solution Place 1 drop into the right eye at bedtime.    06/11/2017 at Unknown time  . [EXPIRED] levofloxacin (LEVAQUIN) 750 MG tablet Take 1 tablet (750 mg total) by mouth every other day for 4 days. (Patient taking differently: Take 500 mg by mouth daily. ) 2 tablet 0 06/12/2017 at Unknown time  . levothyroxine (SYNTHROID, LEVOTHROID) 50 MCG tablet Take 50 mcg by mouth daily before breakfast.   06/12/2017 at Unknown time  . lisinopril (PRINIVIL,ZESTRIL) 5 MG tablet Take 5 mg by mouth daily.    06/12/2017 at Unknown time  . Omega-3 Fatty Acids (FISH OIL PO) Take 1 capsule by mouth daily.  06/12/2017 at Unknown time  . simvastatin (ZOCOR) 40 MG tablet Take 40 mg by mouth daily.   06/12/2017 at Unknown time  . timolol (BETIMOL) 0.5 % ophthalmic solution Place 1 drop into both eyes 2 (two) times daily.   06/12/2017 at Unknown time  . vitamin B-12 (CYANOCOBALAMIN) 1000 MCG tablet Take 1,000 mcg by mouth 2 (two) times daily.    06/12/2017 at Unknown time  . diphenhydramine-acetaminophen (TYLENOL PM) 25-500 MG TABS tablet Take 1 tablet by mouth at bedtime as needed.   PRN at PRN  . permethrin (ELIMITE) 5 % cream Apply 1 application  topically daily.   PRN at PRN   Social History   Socioeconomic History  . Marital status: Unknown    Spouse name: Not on file  . Number of children: Not on file  . Years of education: Not on file  . Highest education level: Not on file  Occupational History  . Occupation: retired  Engineer, productionocial Needs  . Financial resource strain: Not on file  . Food insecurity:    Worry: Not on file    Inability: Not on file  . Transportation needs:    Medical: Not on file    Non-medical: Not on file  Tobacco Use  . Smoking status: Former Smoker    Years: 30.00    Last attempt to quit: 1971    Years since quitting: 48.2  . Smokeless tobacco: Never Used  Substance and Sexual Activity  . Alcohol use: Never    Frequency: Never  . Drug use: Never  . Sexual activity: Not on file  Lifestyle  . Physical activity:    Days per week: Not on file    Minutes per session: Not on file  . Stress: Not on file  Relationships  . Social connections:    Talks on phone: Not on file    Gets together: Not on file    Attends religious service: Not on file    Active member of club or organization: Not on file    Attends meetings of clubs or organizations: Not on file    Relationship status: Not on file  . Intimate partner violence:    Fear of current or ex partner: Not on file    Emotionally abused: Not on file    Physically abused: Not on file    Forced sexual activity: Not on file  Other Topics Concern  . Not on file  Social History Narrative  . Not on file    History reviewed. No pertinent family history.    Review of systems complete and found to be negative unless listed above      PHYSICAL EXAM  General: Well developed, well nourished, in no acute distress HEENT:  Normocephalic and atramatic Neck:  No JVD.  Lungs: Clear bilaterally to auscultation and percussion. Heart: HRRR . Normal S1 and S2 without gallops or murmurs.  Abdomen: Bowel sounds are positive, abdomen soft and non-tender   Msk:  Back normal, normal gait. Normal strength and tone for age. Extremities: No clubbing, cyanosis or edema.   Neuro: Alert and oriented X 3. Psych:  Good affect, responds appropriately  Labs:   Lab Results  Component Value Date   WBC 8.8 06/13/2017   HGB 11.4 (L) 06/13/2017   HCT 33.3 (L) 06/13/2017   MCV 89.7 06/13/2017   PLT 191 06/13/2017    Recent Labs  Lab 06/13/17 0455  NA 141  K 3.8  CL 114*  CO2 20*  BUN  45*  CREATININE 1.77*  CALCIUM 8.1*  GLUCOSE 126*   Lab Results  Component Value Date   TROPONINI <0.03 06/07/2017   No results found for: CHOL No results found for: HDL No results found for: LDLCALC No results found for: TRIG No results found for: CHOLHDL No results found for: LDLDIRECT    Radiology: Dg Chest 2 View  Result Date: 06/12/2017 CLINICAL DATA:  Preoperative evaluation for pacemaker placement EXAM: CHEST - 2 VIEW COMPARISON:  06/07/2017 FINDINGS: Normal heart size, mediastinal contours, and pulmonary vascularity. Atherosclerotic calcification aorta. Chronic bronchitic changes and diffuse interstitial prominence stable. Slightly improved RIGHT upper lobe infiltrate. Mild bibasilar atelectasis and small pleural effusions. No pneumothorax. No acute osseous findings. IMPRESSION: Slightly improved RIGHT upper lobe infiltrate. Chronic bronchitic and diffuse interstitial changes with new mild bibasilar atelectasis and small pleural effusions. Electronically Signed   By: Ulyses Southward M.D.   On: 06/12/2017 16:06   Dg Chest 2 View  Result Date: 06/07/2017 CLINICAL DATA:  Initial evaluation for acute chest pain, shortness of breath, weakness. EXAM: CHEST - 2 VIEW COMPARISON:  None. FINDINGS: Cardiac and mediastinal silhouettes are within normal limits. Aortic atherosclerosis. Lungs are hyperinflated with underlying emphysematous changes. Superimposed hazy infiltrate at the inferior right upper lobe, suspicious for pneumonia. No other focal airspace disease. No  pulmonary edema or pleural effusion. No pneumothorax. Irregular scarring and/or architectural distortion present at the right lung apex. No acute osseus abnormality. IMPRESSION: 1. Focal right upper lobe infiltrate, suspicious for pneumonia. 2. Underlying emphysema. 3. Aortic atherosclerosis. Electronically Signed   By: Rise Mu M.D.   On: 06/07/2017 21:07   Dg Chest Port 1 View  Result Date: 06/13/2017 CLINICAL DATA:  Post pacemaker insertion EXAM: PORTABLE CHEST 1 VIEW COMPARISON:  Portable exam 1338 hours compared to 06/12/2017 FINDINGS: New LEFT subclavian transvenous pacemaker with leads projecting over RIGHT atrium and RIGHT ventricle. External pacing leads present. Normal heart size and mediastinal contours. Atherosclerotic calcification aorta. Increased perihilar infiltrates likely pulmonary edema. No gross pleural effusion or pneumothorax. IMPRESSION: Pacemaker leads project over RIGHT atrium and RIGHT ventricle. Increased pulmonary edema. Electronically Signed   By: Ulyses Southward M.D.   On: 06/13/2017 13:52   Dg C-arm 1-60 Min-no Report  Result Date: 06/13/2017 Fluoroscopy was utilized by the requesting physician.  No radiographic interpretation.    EKG: Atrial sensing with ventricular pacing  ASSESSMENT AND PLAN:   1. Successful dual ppm 2. CHF, etiology uncertain  Recommendations  1. Lasix 40 mg IV Q12 2. 2D echo 3. Further recommendations pending echo results  Signed: Marcina Millard MD,PhD, [facility] 06/14/2017, 8:19 AM

## 2017-06-14 NOTE — Evaluation (Signed)
Physical Therapy Evaluation Patient Details Name: Logan Strong MRN: [id number] DOB: June 16, 1931 Today's Date: 06/14/2017   History of Present Illness  Pt. is a 84 y/o M presenting to to hospital from Pre-op w/ hyperglycemia 06/12/17; Pt admitted 06/12/17 w/ diagnosis of hyperglycemia, heart block, acute kidney injury. Pacemaker insertion performed 06/13/17. PMHx includes: anemia, kidney disease, CAD, DM II, dysrhythmia, glaucoma, hypothyroid, pneumonia, sleep anpnea, and hernia repair.    Clinical Impression  Pt demonstrated bed mobility (supervison), transfers and ambulation (40 ft, 120 ft) RW CGA. HR and O2 monitored throughout session (4L O2 via nasal cannula throughout session); O2 sats would desat to 88% with activity and increase in ~ 1 min to >90%. Pt reported Dr. removed L shoulder sling this morning (06/14/17) (pacemaker insertion 06/12/17) and told him that precautions were to not raise L arm. When Pt ambulated, he rested left hand on RW, but did not bear weight through LUE. Pt's daughter Logan Strong was present during session and reported that she will be staying w/ him until mid next week and then return to [place]; Logan Strong also stated that Pt has another daughter in the area that will check on him daily after she leaves. Pt would benefit from skilled PT to progress strength and activity tolerance. Recommend HHPT w/ intermittent supervision upon discharge from acute hospitalization.    Follow Up Recommendations Home health PT;Supervision - Intermittent    Equipment Recommendations  Rolling walker with 5" wheels;3in1 (PT)    Recommendations for Other Services       Precautions / Restrictions Precautions Precautions: Fall;ICD/Pacemaker Precaution Comments: Pacemaker insertion procedure 06/12/17; Pt reported Dr. removed L shoulder sling 06/14/17 and precautions were not to lift L shoulder Restrictions Weight Bearing Restrictions: No      Mobility  Bed Mobility Overal bed mobility: Needs  Assistance Bed Mobility: Supine to Sit     Supine to sit: HOB elevated;Supervision     General bed mobility comments: Pt required a little extra time and effort to move to EOB, but demonstrated good technique and stability  Transfers Overall transfer level: Needs assistance Equipment used: Rolling walker (2 wheeled) Transfers: Sit to/from Stand Sit to Stand: Min guard;Min assist         General transfer comment: CGA to min assist to stand for lift; Pt demonstrated safe use of RW  Ambulation/Gait Ambulation/Gait assistance: Min guard;+2 safety/equipment Ambulation Distance (Feet): 160 Feet(40 ft, 120 ft) Assistive device: Rolling walker (2 wheeled) Gait Pattern/deviations: Step-through pattern;Decreased step length - right;Decreased step length - left;Trunk flexed Gait velocity: decreased   General Gait Details: Pt ambulated RW CGA (+2 chair follow) (while on 4 L oxygen) 40 ft and then sat to monitor O2 sats; O2 desat to 88% and improved in ~801min to >90%; Pt stated he was asymptomatic and wanted to continue ambulation; Pt then ambulated 120 ft and sat in recliner in his room, O2 desat to 88% and increased in ~ 1 min to >90%.  Stairs            Wheelchair Mobility    Modified Rankin (Stroke Patients Only)       Balance Overall balance assessment: Needs assistance Sitting-balance support: Bilateral upper extremity supported;Feet supported Sitting balance-Leahy Scale: Fair Sitting balance - Comments: demonstrated fair balance sitting EOB when managing clothing reaching within BOS     Standing balance-Leahy Scale: Poor Standing balance comment: RW CGA required for safety  Pertinent Vitals/Pain Pain Assessment: No/denies pain Pain Score: 0-No pain Pain Intervention(s): Limited activity within patient's tolerance;Monitored during session;Repositioned    Home Living Family/patient expects to be discharged to:: Private  residence Living Arrangements: Spouse/significant other Available Help at Discharge: Family;Available PRN/intermittently Type of Home: House Home Access: Stairs to enter Entrance Stairs-Rails: Can reach both;Left;Right Entrance Stairs-Number of Steps: 5 Home Layout: One level Home Equipment: None      Prior Function Level of Independence: Independent         Comments: Pt reports independent with all ADLs w/o use of DME and that he still drives     Hand Dominance   Dominant Hand: Right    Extremity/Trunk Assessment   Upper Extremity Assessment Upper Extremity Assessment: RUE deficits/detail;LUE deficits/detail RUE Deficits / Details: AROM WNL and strength at least 3/5 shoulder flexion, elbow flexion/extension, pronation/supination LUE Deficits / Details: unable to assess due to Pacemaker insertion 06/12/17 ROM precautions    Lower Extremity Assessment Lower Extremity Assessment: Generalized weakness;RLE deficits/detail;LLE deficits/detail RLE Deficits / Details: AROM WNL and strength at least 3/5 demonstrated by marching EOB and bringing knee to chest: hip flexion, knee flexion/extension, dorsiflexion/plantarflexion LLE Deficits / Details: AROM WNL and strength at least 3/5 demonstrated by marching EOB and bringing knee to chest: hip flexion, knee flexion/extension, dorsiflexion/plantarflexion    Cervical / Trunk Assessment Cervical / Trunk Assessment: Kyphotic  Communication   Communication: No difficulties  Cognition Arousal/Alertness: Awake/alert Behavior During Therapy: WFL for tasks assessed/performed Overall Cognitive Status: Within Functional Limits for tasks assessed                                 General Comments: Pt is A&O x 4      General Comments  Pt agreeable to session    Exercises     Assessment/Plan    PT Assessment Patient needs continued PT services  PT Problem List Decreased strength;Decreased range of motion;Decreased activity  tolerance;Decreased balance;Decreased mobility;Decreased knowledge of use of DME;Decreased safety awareness;Decreased knowledge of precautions;Cardiopulmonary status limiting activity       PT Treatment Interventions DME instruction;Gait training;Balance training;Stair training;Functional mobility training;Therapeutic activities;Therapeutic exercise;Patient/family education    PT Goals (Current goals can be found in the Care Plan section)  Acute Rehab PT Goals Patient Stated Goal: I want to get stronger and go home PT Goal Formulation: With patient Time For Goal Achievement: 06/28/17 Potential to Achieve Goals: Good    Frequency Min 2X/week   Barriers to discharge        Co-evaluation               AM-PAC PT "6 Clicks" Daily Activity  Outcome Measure Difficulty turning over in bed (including adjusting bedclothes, sheets and blankets)?: A Little Difficulty moving from lying on back to sitting on the side of the bed? : A Little Difficulty sitting down on and standing up from a chair with arms (e.g., wheelchair, bedside commode, etc,.)?: Unable Help needed moving to and from a bed to chair (including a wheelchair)?: A Little Help needed walking in hospital room?: A Little Help needed climbing 3-5 steps with a railing? : A Lot 6 Click Score: 15    End of Session Equipment Utilized During Treatment: Gait belt;Oxygen(4 L oxygen) Activity Tolerance: Other (comment)(O2 desat; (see ambulation for details)) Patient left: in chair;with call bell/phone within reach;with chair alarm set;with family/visitor present(B heels elevated by pillow) Nurse Communication: Mobility status(O2 desat) PT Visit  Diagnosis: Unsteadiness on feet (R26.81);Other abnormalities of gait and mobility (R26.89);Muscle weakness (generalized) (M62.81);Difficulty in walking, not elsewhere classified (R26.2)    Time: 1610-9604 PT Time Calculation (min) (ACUTE ONLY): 30 min   Charges:         PT G Codes:          Kodee Drury Mondrian-Pardue, SPT 06/14/2017, 3:53 PM

## 2017-06-14 NOTE — Progress Notes (Addendum)
Inpatient Diabetes Program Recommendations  AACE/ADA: New Consensus Statement on Inpatient Glycemic Control (2015)  Target Ranges:  Prepandial:   less than 140 mg/dL      Peak postprandial:   less than 180 mg/dL (1-2 hours)      Critically ill patients:  140 - 180 mg/dL   Results for Logan Strong, Logan Strong (MRN [id number]) as of 06/14/2017 09:45  Ref. Range 06/13/2017 04:43 06/13/2017 07:33 06/13/2017 11:21 06/13/2017 16:33 06/13/2017 21:25  Glucose-Capillary Latest Ref Range: 65 - 99 mg/dL 119 (H) 108 (H) 123 (H) 216 (H)  5 units NOVOLOG  183 (H)  12 units LANTUS   Results for Logan Strong, Logan Strong (MRN [id number]) as of 06/14/2017 09:45  Ref. Range 06/14/2017 03:45 06/14/2017 07:53  Glucose-Capillary Latest Ref Range: 65 - 99 mg/dL 244 (H) 372 (H)  15 units NOVOLOG       Diabetes history: Type 1 DM (patient does Not make endogenous insulin)  Outpatient Diabetes meds: Medtronic insulin pump Settings per last endocrinologist appointment on 03/2017 are: Medtronic 630G Off pump plan 12 units Lantus daily  Basal: MN 0.2; 3:30 am 0.6; 4am 0.525; 1130am 0.55; 4pm 0.6; 10:30 0.5 change to: MN 0.2; 330am0.5; 6a0.6; 4p 0.625; 1030p 0.5  I/C: MN 1:10 ISF: 45 BG target 110-140     Unsure why pt's CBG so highly elevated this AM??  Pt did receive his scheduled dose of Lantus last PM per documentation.  Concern that the present Novolog SSI regimen may be too aggressive for patient.  Per insulin pump records, pt only takes 1 unit Novolog for every 45 mg/dl above his target CBG of 110-140 mg/dl.     MD- Please consider the following in-hospital insulin adjustments:  1. Reduce Novolog SSI to Sensitive scale (0-9 units) TID AC + HS  2. Start Novolog Meal Coverage: Novolog 4 units TID with meals (hold if pt eats <50% of meal)  3. Would not increase Lantus at this time.  Pt got 12 units Lantus on the night of 04/03 and AM CBG on 04/04 was 108 mg/dl.    4. If patient allowed to resume insulin pump in  hospital, will need to wait to resume pump until 9pm tonight.  Pt received 12 units Lantus last night at bedtime.  Do not want Lantus and basal rates on pump to overlap.    Addendum 11:30am- Met with pt and family this AM.  Discussed with pt that we have been giving him Lantus insulin equivalent to the amount of basal insulin he gets on his insulin pump.  Asked pt if he ate anything prior to CBG being taken this AM.  Pt stated he has not eaten anything since last PM at bedtime.  Per pt and daughter, pt ate well at 4pm after his procedure but then only had one pack of graham crackers at bedtime.  Pt states he felt "bad" all night (states he had issues with breathing and some nausea) and feels like his "rough night" may have led to his elevated CBG this AM.  Discussed with pt and family that when pt resumes his insulin pump he will need to wait ~24 hours after last dose Lantus given to resume his basal rates on his pump.  Can either put his pump back on and suspend the basal rates until between 8-10 pm the day he resumes his pump.  Or, patient can give himself injections of Novolog for correction and Carb coverage while off pump and can resume his pump completely between  8-10pm the night he restarts his pump.  Explained to pt and family that we just don't want the basal rates and the Lantus to overlap which could cause Hypoglycemia.  Pt and family stated understanding to this information and stated they could do this plan.  Discussed with pt that I have requested the MD to adjust his current Novolog orders to more closely mimic his insulin pump regimen.     --Will follow patient during hospitalization--  Wyn Quaker RN, MSN, CDE Diabetes Coordinator Inpatient Glycemic Control Team Team Pager: [phone] (8a-5p)

## 2017-06-14 NOTE — Progress Notes (Signed)
Pt called this RN complaining of shortness of breath, pt sats in the 80's on 2L, bumped pt up to 3L and stopped IV fluids, pt with crackles in lungs. Pt called this RN back about 40 mins later still complaining of feelings short of breath. sats at 88% on 3L O2, bumped pt up to 4L pt came up to 91%. Dr. Sheryle Hailiamond paged back, orders to stop IV fluids placed and also orders for PRN duoneb.  Shirley FriarAlexis Miller, RN, BSN

## 2017-06-14 NOTE — Plan of Care (Signed)
Patient is progressing in all areas. 

## 2017-06-15 ENCOUNTER — Inpatient Hospital Stay
Admit: 2017-06-15 | Discharge: 2017-06-15 | Disposition: A | Discharge status: Home / Self Care | Payer: Medicare HMO | Attending: Cardiology | Admitting: Cardiology

## 2017-06-15 LAB — BASIC METABOLIC PANEL
ANION GAP: 10 (ref 5–15)
BUN: 34 mg/dL — ABNORMAL HIGH (ref 6–20)
CHLORIDE: 106 mmol/L (ref 101–111)
CO2: 23 mmol/L (ref 22–32)
Calcium: 8.9 mg/dL (ref 8.9–10.3)
Creatinine, Ser: 1.48 mg/dL — ABNORMAL HIGH (ref 0.61–1.24)
GFR calc Af Amer: 49 mL/min — ABNORMAL LOW (ref >60)
GFR, EST NON AFRICAN AMERICAN: 42 mL/min — AB (ref >60)
Glucose, Bld: 268 mg/dL — ABNORMAL HIGH (ref 65–99)
POTASSIUM: 4.3 mmol/L (ref 3.5–5.1)
SODIUM: 139 mmol/L (ref 135–145)

## 2017-06-15 LAB — ECHOCARDIOGRAM COMPLETE
HEIGHTINCHES: 66 in
Weight: 2307.2 oz

## 2017-06-15 LAB — GLUCOSE, CAPILLARY
GLUCOSE-CAPILLARY: 242 mg/dL — AB (ref 65–99)
Glucose-Capillary: 358 mg/dL — ABNORMAL HIGH (ref 65–99)
Glucose-Capillary: 214 mg/dL — ABNORMAL HIGH (ref 65–99)

## 2017-06-15 MED ORDER — FUROSEMIDE 40 MG PO TABS
40.0000 mg | ORAL_TABLET | Freq: Every day | ORAL | Status: DC
  Administered 2017-06-15 – 2017-06-16 (×2): 40 mg via ORAL
  Filled 2017-06-15 (×2): qty 1

## 2017-06-15 MED ORDER — FUROSEMIDE 20 MG PO TABS: 20.0000 mg | ORAL_TABLET | Freq: Every day | ORAL | 0 refills | Status: DC

## 2017-06-15 NOTE — Progress Notes (Signed)
Patient Name: Logan Strong Date of Encounter: 06/15/2017  Hospital Problem List     Active Problems:   Hyperglycemia    Patient Profile     84 year old male admitted with high-grade heart block for permanent pacemaker.  Developed evidence of pulmonary edema yesterday.  Echo reveals preserved LV function.  Clinically improved.  Subjective   Short of breath.  Inpatient Medications    . aspirin EC  81 mg Oral Daily  . brimonidine  1 drop Left Eye BID  . calcium-vitamin D  1 tablet Oral BID  . dorzolamide  1 drop Left Eye BID  . furosemide  40 mg Intravenous Q12H  . heparin  5,000 Units Subcutaneous Q8H  . insulin aspart  0-5 Units Subcutaneous QHS  . insulin aspart  0-9 Units Subcutaneous TID WC  . insulin aspart  4 Units Subcutaneous TID WC  . insulin glargine  12 Units Subcutaneous QHS  . latanoprost  1 drop Left Eye QHS  . levofloxacin  750 mg Oral Q48H  . levothyroxine  75 mcg Oral QAC breakfast  . lisinopril  5 mg Oral Daily  . omega-3 acid ethyl esters  1 g Oral Daily  . simvastatin  40 mg Oral Daily  . sodium chloride flush  3 mL Intravenous Q12H  . timolol  1 drop Both Eyes BID  . vitamin B-12  1,000 mcg Oral BID    Vital Signs    Vitals:   06/14/17 2055 06/14/17 2231 06/15/17 0524 06/15/17 0900  BP: (!) 136/59  (!) 153/67 (!) 162/76  Pulse: 62  91 79  Resp: 17  17 16   Temp: 99 F (37.2 C)  98.1 F (36.7 C) 97.9 F (36.6 C)  TempSrc: Oral  Oral Axillary  SpO2: 92% 92% 91% 95%  Weight:      Height:        Intake/Output Summary (Last 24 hours) at 06/15/2017 1003 Last data filed at 06/15/2017 0545 Gross per 24 hour  Intake 0 ml  Output 3200 ml  Net -3200 ml   Filed Weights   06/12/17 1249 06/13/17 0514  Weight: 59 kg (130 lb) 65.4 kg (144 lb 3.2 oz)    Physical Exam    GEN: Well nourished, well developed, in no acute distress.  HEENT: normal.  Neck: Supple, no JVD, carotid bruits, or masses. Cardiac: RRR, no murmurs, rubs, or gallops. No  clubbing, cyanosis, edema.  Radials/DP/PT 2+ and equal bilaterally.  Respiratory:  Respirations regular and unlabored, clear to auscultation bilaterally. GI: Soft, nontender, nondistended, BS + x 4. MS: no deformity or atrophy. Skin: warm and dry, no rash. Neuro:  Strength and sensation are intact. Psych: Normal affect.  Labs    CBC Recent Labs    06/12/17 1308 06/13/17 0455  WBC 6.1 8.8  HGB 11.8* 11.4*  HCT 35.3* 33.3*  MCV 90.8 89.7  PLT 193 191   Basic Metabolic Panel Recent Labs    16/10/96 0455 06/15/17 0426  NA 141 139  K 3.8 4.3  CL 114* 106  CO2 20* 23  GLUCOSE 126* 268*  BUN 45* 34*  CREATININE 1.77* 1.48*  CALCIUM 8.1* 8.9   Liver Function Tests No results for input(s): AST, ALT, ALKPHOS, BILITOT, PROT, ALBUMIN in the last 72 hours. No results for input(s): LIPASE, AMYLASE in the last 72 hours. Cardiac Enzymes No results for input(s): CKTOTAL, CKMB, CKMBINDEX, TROPONINI in the last 72 hours. BNP No results for input(s): BNP in the last 72 hours.  D-Dimer No results for input(s): DDIMER in the last 72 hours. Hemoglobin A1C No results for input(s): HGBA1C in the last 72 hours. Fasting Lipid Panel No results for input(s): CHOL, HDL, LDLCALC, TRIG, CHOLHDL, LDLDIRECT in the last 72 hours. Thyroid Function Tests Recent Labs    06/13/17 0455  TSH 9.043*    Telemetry    Atrial ventricular pacing.  ECG    AV pacing.  Radiology    Dg Chest 2 View  Result Date: 06/12/2017 CLINICAL DATA:  Preoperative evaluation for pacemaker placement EXAM: CHEST - 2 VIEW COMPARISON:  06/07/2017 FINDINGS: Normal heart size, mediastinal contours, and pulmonary vascularity. Atherosclerotic calcification aorta. Chronic bronchitic changes and diffuse interstitial prominence stable. Slightly improved RIGHT upper lobe infiltrate. Mild bibasilar atelectasis and small pleural effusions. No pneumothorax. No acute osseous findings. IMPRESSION: Slightly improved RIGHT upper lobe  infiltrate. Chronic bronchitic and diffuse interstitial changes with new mild bibasilar atelectasis and small pleural effusions. Electronically Signed   By: Ulyses SouthwardMark  Boles M.D.   On: 06/12/2017 16:06   Dg Chest 2 View  Result Date: 06/07/2017 CLINICAL DATA:  Initial evaluation for acute chest pain, shortness of breath, weakness. EXAM: CHEST - 2 VIEW COMPARISON:  None. FINDINGS: Cardiac and mediastinal silhouettes are within normal limits. Aortic atherosclerosis. Lungs are hyperinflated with underlying emphysematous changes. Superimposed hazy infiltrate at the inferior right upper lobe, suspicious for pneumonia. No other focal airspace disease. No pulmonary edema or pleural effusion. No pneumothorax. Irregular scarring and/or architectural distortion present at the right lung apex. No acute osseus abnormality. IMPRESSION: 1. Focal right upper lobe infiltrate, suspicious for pneumonia. 2. Underlying emphysema. 3. Aortic atherosclerosis. Electronically Signed   By: Rise MuBenjamin  McClintock M.D.   On: 06/07/2017 21:07   Dg Chest Port 1 View  Result Date: 06/13/2017 CLINICAL DATA:  Post pacemaker insertion EXAM: PORTABLE CHEST 1 VIEW COMPARISON:  Portable exam 1338 hours compared to 06/12/2017 FINDINGS: New LEFT subclavian transvenous pacemaker with leads projecting over RIGHT atrium and RIGHT ventricle. External pacing leads present. Normal heart size and mediastinal contours. Atherosclerotic calcification aorta. Increased perihilar infiltrates likely pulmonary edema. No gross pleural effusion or pneumothorax. IMPRESSION: Pacemaker leads project over RIGHT atrium and RIGHT ventricle. Increased pulmonary edema. Electronically Signed   By: Ulyses SouthwardMark  Boles M.D.   On: 06/13/2017 13:52   Dg C-arm 1-60 Min-no Report  Result Date: 06/13/2017 Fluoroscopy was utilized by the requesting physician.  No radiographic interpretation.    Assessment & Plan    Congestive heart failure-clinically improved.  Less short of breath.   Review of echo this morning showed normal LV function with mild diastolic dysfunction.  No significant valvular abnormalities.  Convert to p.o. Lasix from IV at 40 mg daily.  Continue with lisinopril at 5 mg daily.  Renal function is improved from admission.  Serum creatinine 1.48 down from 1.77 yesterday and 2.33 on admission.  He appears near his baseline.  Would ambulate today and if stable consider discharge with outpatient follow-up.  High-grade heart block-pacemaker functioning normally.  Site looks good.  I   Signed, Darlin PriestlyKenneth A. Lounette Sloan MD 06/15/2017, 10:03 AM  Pager: [phone]

## 2017-06-15 NOTE — Progress Notes (Signed)
[facility] [place] [facility]   PATIENT NAME: Logan Strong    MR#:  [id number]  DATE OF BIRTH:  March 29, 1931  SUBJECTIVE:   No acute events overnight, denies any chest pain.  Admits to some mild exertional dyspnea.  Patient's family is at bedside.  REVIEW OF SYSTEMS:    Review of Systems  Constitutional: Negative for chills and fever.  HENT: Negative for congestion and tinnitus.   Eyes: Negative for blurred vision and double vision.  Respiratory: Negative for cough, shortness of breath and wheezing.   Cardiovascular: Negative for chest pain, orthopnea and PND.  Gastrointestinal: Negative for abdominal pain, diarrhea, nausea and vomiting.  Genitourinary: Negative for dysuria and hematuria.  Neurological: Negative for dizziness, sensory change and focal weakness.  All other systems reviewed and are negative.   Nutrition: Heart Healthy Tolerating Diet: Yes Tolerating PT: Eval noted.   DRUG ALLERGIES:  No Known Allergies  VITALS:  Blood pressure (!) 162/76, pulse 79, temperature 97.9 F (36.6 C), temperature source Axillary, resp. rate 16, height 5\' 6"  (1.676 m), weight 65.4 kg (144 lb 3.2 oz), SpO2 95 %.  PHYSICAL EXAMINATION:   Physical Exam  GENERAL:  84 y.o.-year-old patient lying in bed in no acute distress.  EYES: Pupils equal, round, reactive to light and accommodation. No scleral icterus. Extraocular muscles intact.  HEENT: Head atraumatic, normocephalic. Oropharynx and nasopharynx clear.  NECK:  Supple, no jugular venous distention. No thyroid enlargement, no tenderness.  LUNGS: Normal breath sounds bilaterally, no wheezing, rales, rhonchi. No use of accessory muscles of respiration.  CARDIOVASCULAR: S1, S2 normal. No murmurs, rubs, or gallops. Pacemaker site with no acute bleeding noted. ABDOMEN: Soft, nontender, nondistended. Bowel sounds present. No organomegaly or mass.  EXTREMITIES: No cyanosis, clubbing or edema b/l.    NEUROLOGIC:  Cranial nerves II through XII are intact. No focal Motor or sensory deficits b/l.   PSYCHIATRIC: The patient is alert and oriented x 3.  SKIN: No obvious rash, lesion, or ulcer.    LABORATORY PANEL:   CBC Recent Labs  Lab 06/13/17 0455  WBC 8.8  HGB 11.4*  HCT 33.3*  PLT 191   ------------------------------------------------------------------------------------------------------------------  Chemistries  Recent Labs  Lab 06/15/17 0426  NA 139  K 4.3  CL 106  CO2 23  GLUCOSE 268*  BUN 34*  CREATININE 1.48*  CALCIUM 8.9   ------------------------------------------------------------------------------------------------------------------  Cardiac Enzymes No results for input(s): TROPONINI in the last 168 hours. ------------------------------------------------------------------------------------------------------------------  RADIOLOGY:  No results found.   ASSESSMENT AND PLAN:   84 year old male with past medical history of chronic kidney disease stage III, diabetes, hypothyroidism, glaucoma, osteoporosis who presented to the hospital due to bradycardia and significant hyperglycemia.  1.  Uncontrolled diabetes mellitus-secondary to patient incorrectly reading his blood sugars with his glucometer. - This is much improved with Lantus and sliding scale insulin coverage, with IV fluids and his sugars are back to baseline. -Patient will resume his insulin pump upon discharge.  2.  Bradycardia-this was secondary to complete heart block.  Patient was seen by Cardiology and is currently status post pacemaker placement and bradycardia is improved and resolved. -Patient is to continue follow-up with cardiology as an outpatient.  3.  Chronic kidney disease stage III-patient's creatinine close to baseline can be further followed as an outpatient.  4.  Hypothyroidism-patient will continue Synthroid.  5.  Acute CHF- this is acute diastolic CHF.  Patient developed some  hypoxemia yesterday and had to be placed on oxygen.  Chest  x-ray was suggestive of pulmonary edema.  Patient was given some IV Lasix.  He has improved.  - cont. Oral lasix for now and will wean off O2 as tolerated.    Possible d/c home tomorrow if doing well.   All the records are reviewed and case discussed with Care Management/Social Worker. Management plans discussed with the patient, family and they are in agreement.  CODE STATUS: Full code  DVT Prophylaxis: Hep SQ  TOTAL TIME TAKING CARE OF THIS PATIENT: 30 minutes.   POSSIBLE D/C IN 1-2 DAYS, DEPENDING ON CLINICAL CONDITION.   Houston Siren M.D on 06/15/2017 at 3:47 PM  Between 7am to 6pm - Pager - [phone]  After 6pm go to [email or web address] - Social research officer, government  [facility]  Office  [phone]  CC: Primary care physician; Mosetta Pigeon, MD

## 2017-06-15 NOTE — Care Management (Signed)
Patient is dropping his oxygen sats top 87% on room air with ambulation.  He does not have an underlying chronic cardiopulmonary diagnosis.  Discussed with patient and daughters in the room that medicare would not cover oxygen with a chronic dx. Patient is being treated for acute CHF, received IV lasix and transitioned to oral today.  Discussed the need to wean the oxygen with primary nurse and to continue to ambulate patient.  Patient says he did have "a bout of pneumonia last week."  He is agreeable to home health services and no agency preference.  Heads up referral to [facility] for RN and PT.

## 2017-06-15 NOTE — Care Management Important Message (Signed)
Important Message  Patient Details  Name: Logan Strong MRN: [id number] Date of Birth: 08/28/1931   Medicare Important Message Given:  Yes  Signed IM notice given   Eber HongGreene, Gennaro Lizotte R, RN 06/15/2017, 3:04 PM

## 2017-06-15 NOTE — Progress Notes (Signed)
SATURATION QUALIFICATIONS: (This note is used to comply with regulatory documentation for home oxygen) **% Patient Saturations on Room Air at Rest =95%   Patient Saturations on 2 Liters of oxygen while Ambulating = 92%  Please briefly explain why patient needs home oxygen: ambulating on room air pt 02 sat 87%. 89 at rest on room air

## 2017-06-15 NOTE — Discharge Summary (Addendum)
[facility] [place] [facility]   PATIENT NAME: Logan Strong    MR#:  [id number]  DATE OF BIRTH:  [birth year]  DATE OF ADMISSION:  06/12/2017 ADMITTING PHYSICIAN: Ihor AustinPavan Pyreddy, MD  DATE OF DISCHARGE: 06/15/2017  PRIMARY CARE PHYSICIAN: Mosetta PigeonSingh, Harmeet, MD    ADMISSION DIAGNOSIS:  Complete heart block (HCC) [I44.2] Hyperglycemia [R73.9] AKI (acute kidney injury) (HCC) [N17.9]  DISCHARGE DIAGNOSIS:  Active Problems:   Hyperglycemia   SECONDARY DIAGNOSIS:   Past Medical History:  Diagnosis Date  . Anemia   . Chronic kidney disease    "functioning at 50%"  . Coronary artery disease   . Diabetes mellitus, type 2 (HCC)   . Dysrhythmia   . Glaucoma   . Hypothyroidism   . Osteoporosis   . Pneumonia   . Sleep apnea    could not tolerate CPAP  . Wears dentures    partial upper and lower    HOSPITAL COURSE:   84-year-old male with past medical history of chronic kidney disease stage III, diabetes, hypothyroidism, glaucoma, osteoporosis who presented to the hospital due to bradycardia and significant hyperglycemia.  1.  Uncontrolled diabetes mellitus-secondary to patient incorrectly reading his blood sugars with his glucometer. - This is much improved with Lantus and sliding scale insulin coverage, and with IV fluids and his sugars are back to baseline. -Patient will resume his insulin pump upon discharge.  2.  Bradycardia-this was secondary to complete heart block.  Patient was seen by Cardiology and is currently status post pacemaker placement and bradycardia is improved and resolved. -Patient is to continue follow-up with cardiology as an outpatient.  3.  Chronic kidney disease stage III-patient's creatinine close to baseline can be further followed as an outpatient.  4.  Hypothyroidism-patient will continue Synthroid.  5.  Acute CHF- this was acute diastolic CHF.  Patient developed some hypoxemia 2 days ago and had to be placed on oxygen.  Chest x-ray  was suggestive of pulmonary edema.  Patient was given some IV Lasix.  He has improved.  He is being discharged on a small dose of Lasix.  He did qualify for home oxygen which is being arranged for him prior to discharge.  Patient was seen by physical therapy and the recommended home health services and patient is being discharged with home health PT, nursing home health aide and social work.  DISCHARGE CONDITIONS:   Stable  CONSULTS OBTAINED:  Treatment Team:  Alwyn Peaallwood, Dwayne D, MD Marcina MillardParaschos, Alexander, MD Dalia HeadingFath, Kenneth A, MD  DRUG ALLERGIES:  No Known Allergies  DISCHARGE MEDICATIONS:   Allergies as of 06/15/2017   No Known Allergies     Medication List    STOP taking these medications   levofloxacin 750 MG tablet Commonly known as:  LEVAQUIN     TAKE these medications   aspirin 81 MG tablet Take 81 mg by mouth daily.   brimonidine 0.2 % ophthalmic solution Commonly known as:  ALPHAGAN Place into the right eye 2 (two) times daily.   CALCIUM CARBONATE-VITAMIN D PO Take 1 tablet by mouth daily.   diphenhydramine-acetaminophen 25-500 MG Tabs tablet Commonly known as:  TYLENOL PM Take 1 tablet by mouth at bedtime as needed.   dorzolamide 2 % ophthalmic solution Commonly known as:  TRUSOPT Place 1 drop into the right eye 2 (two) times daily.   FISH OIL PO Take 1 capsule by mouth daily.   furosemide 20 MG tablet Commonly known as:  LASIX Take 1 tablet (20 mg  total) by mouth daily. Start taking on:  06/16/2017   insulin aspart 100 UNIT/ML injection Commonly known as:  novoLOG Inject 30-50 Units into the skin daily. In pump.  Basil and bolus before each meal   insulin glargine 100 UNIT/ML injection Commonly known as:  LANTUS Inject 15 Units into the skin as needed.   latanoprost 0.005 % ophthalmic solution Commonly known as:  XALATAN Place 1 drop into the right eye at bedtime.   levothyroxine 50 MCG tablet Commonly known as:  SYNTHROID, LEVOTHROID Take 50  mcg by mouth daily before breakfast.   lisinopril 5 MG tablet Commonly known as:  PRINIVIL,ZESTRIL Take 5 mg by mouth daily.   permethrin 5 % cream Commonly known as:  ELIMITE Apply 1 application topically daily.   simvastatin 40 MG tablet Commonly known as:  ZOCOR Take 40 mg by mouth daily.   timolol 0.5 % ophthalmic solution Commonly known as:  BETIMOL Place 1 drop into both eyes 2 (two) times daily.   vitamin B-12 1000 MCG tablet Commonly known as:  CYANOCOBALAMIN Take 1,000 mcg by mouth 2 (two) times daily.            Durable Medical Equipment  (From admission, onward)        Start     Ordered   06/15/17 1420  For home use only DME Walker rolling  Once    Question:  Patient needs a walker to treat with the following condition  Answer:  CHF (congestive heart failure) (HCC)   06/15/17 1419   06/15/17 1419  For home use only DME oxygen  Once    Question Answer Comment  Mode or (Route) Nasal cannula   Liters per Minute 2   Frequency Continuous (stationary and portable oxygen unit needed)   Oxygen conserving device Yes   Oxygen delivery system Gas      06/15/17 1419        DISCHARGE INSTRUCTIONS:   DIET:  Cardiac diet and Diabetic diet  DISCHARGE CONDITION:  Stable  ACTIVITY:  Activity as tolerated  OXYGEN:  Home Oxygen: No.   Oxygen Delivery: 2 liters/min via Patient connected to nasal cannula oxygen  DISCHARGE LOCATION:  Home with home health nursing, PT, Aide, Social Work.    If you experience worsening of your admission symptoms, develop shortness of breath, life threatening emergency, suicidal or homicidal thoughts you must seek medical attention immediately by calling 911 or calling your MD immediately  if symptoms less severe.  You Must read complete instructions/literature along with all the possible adverse reactions/side effects for all the Medicines you take and that have been prescribed to you. Take any new Medicines after you have  completely understood and accpet all the possible adverse reactions/side effects.   Please note  You were cared for by a hospitalist during your hospital stay. If you have any questions about your discharge medications or the care you received while you were in the hospital after you are discharged, you can call the unit and asked to speak with the hospitalist on call if the hospitalist that took care of you is not available. Once you are discharged, your primary care physician will handle any further medical issues. Please note that NO REFILLS for any discharge medications will be authorized once you are discharged, as it is imperative that you return to your primary care physician (or establish a relationship with a primary care physician if you do not have one) for your aftercare needs so that they  can reassess your need for medications and monitor your lab values.     Today   Shortness of breath improved. Ambulated today again and did qualify for Home o2 and will d/c home with it. Pt. To go home with home health services.   VITAL SIGNS:  Blood pressure (!) 162/76, pulse 79, temperature 97.9 F (36.6 C), temperature source Axillary, resp. rate 16, height 5\' 6"  (1.676 m), weight 65.4 kg (144 lb 3.2 oz), SpO2 95 %.  I/O:    Intake/Output Summary (Last 24 hours) at 06/15/2017 1509 Last data filed at 06/15/2017 1417 Gross per 24 hour  Intake 480 ml  Output 3000 ml  Net -2520 ml    PHYSICAL EXAMINATION:  GENERAL:  84 y.o.-year-old patient lying in the bed with no acute distress.  EYES: Pupils equal, round, reactive to light and accommodation. No scleral icterus. Extraocular muscles intact.  HEENT: Head atraumatic, normocephalic. Oropharynx and nasopharynx clear.  NECK:  Supple, no jugular venous distention. No thyroid enlargement, no tenderness.  LUNGS: Normal breath sounds bilaterally, no wheezing, rales,rhonchi. No use of accessory muscles of respiration.  CARDIOVASCULAR: S1, S2 normal.  No murmurs, rubs, or gallops. Pacemaker site clean with no bleeding noted. ABDOMEN: Soft, non-tender, non-distended. Bowel sounds present. No organomegaly or mass.  EXTREMITIES: No pedal edema, cyanosis, or clubbing.  NEUROLOGIC: Cranial nerves II through XII are intact. No focal motor or sensory defecits b/l.  PSYCHIATRIC: The patient is alert and oriented x 3.  SKIN: No obvious rash, lesion, or ulcer.   DATA REVIEW:   CBC Recent Labs  Lab 06/13/17 0455  WBC 8.8  HGB 11.4*  HCT 33.3*  PLT 191    Chemistries  Recent Labs  Lab 06/15/17 0426  NA 139  K 4.3  CL 106  CO2 23  GLUCOSE 268*  BUN 34*  CREATININE 1.48*  CALCIUM 8.9    Cardiac Enzymes No results for input(s): TROPONINI in the last 168 hours.  Microbiology Results  Results for orders placed or performed during the hospital encounter of 06/12/17  Surgical pcr screen     Status: None   Collection Time: 06/12/17 10:00 AM  Result Value Ref Range Status   MRSA, PCR NEGATIVE NEGATIVE Final   Staphylococcus aureus NEGATIVE NEGATIVE Final    Comment: (NOTE) The Xpert SA Assay (FDA approved for NASAL specimens in patients 90 years of age and older), is one component of a comprehensive surveillance program. It is not intended to diagnose infection nor to guide or monitor treatment. Performed at [facility], [address], [place], [place] [address]     RADIOLOGY:  No results found.    Management plans discussed with the patient, family and they are in agreement.  CODE STATUS:     Code Status Orders  (From admission, onward)        Start     Ordered   06/12/17 1810  Full code  Continuous     06/12/17 1810  Advance Directive Documentation     Most Recent Value  Type of Advance Directive  Healthcare Power of Attorney, Living will  Pre-existing out of facility DNR order (yellow form or pink MOST form)  -  "MOST" Form in Place?  -      TOTAL TIME TAKING CARE OF THIS PATIENT: 40  minutes.    Houston Siren M.D on 06/15/2017 at 3:09 PM  Between 7am to 6pm - Pager - [phone]  After 6pm go to [email or web address] - password [email or web address]  [employer] [facility]  Office  [phone]  CC: Primary care physician; Mosetta Pigeon, MD

## 2017-06-16 LAB — GLUCOSE, CAPILLARY
GLUCOSE-CAPILLARY: 197 mg/dL — AB (ref 65–99)
GLUCOSE-CAPILLARY: 201 mg/dL — AB (ref 65–99)
GLUCOSE-CAPILLARY: 238 mg/dL — AB (ref 65–99)

## 2017-06-16 NOTE — Progress Notes (Signed)
Pt discharged t home this evening. Portable 02 provided by home care. Iv's and tele removed. disch instructions given to pt and family. disch via w.c. accomapnied by family

## 2017-06-16 NOTE — Progress Notes (Signed)
SATURATION QUALIFICATIONS: (This note is used to comply with regulatory documentation for home oxygen)  Patient Saturations on Room Air at Rest = 93%  Patient Saturations on Room Air while Ambulating = 86%  Patient Saturations on3 Liters of oxygen while Ambulating = 92%  Please briefly explain why patient needs home oxygen: pt desaturates on room air while ambulating.

## 2017-06-16 NOTE — Progress Notes (Signed)
Pt slept well throughout the night with no c/o pain, SOB or acute distress. Pt. In paced rhythm throughout the night.

## 2017-06-16 NOTE — Care Management (Addendum)
patient continues to desats  with exertion to 86%.  CM had explained medicare coverage for oxygen to daughters yesterday and re explained to daugthers today that medicare would not cover oxygen as there is no underlying chronic medical conditionand that medicare would not cover oxygen.  Notified [facility] for need for oxygen and of order for RN PT and OT. Daugthers decline need for aide.  Daughter assists with personal care.  Informed [facility]. CM was unable to modify the order.  Patient has rolling walker.

## 2017-06-18 DIAGNOSIS — J9601 Acute respiratory failure with hypoxia: Secondary | ICD-10-CM | POA: Diagnosis not present

## 2017-06-18 DIAGNOSIS — R739 Hyperglycemia, unspecified: Secondary | ICD-10-CM | POA: Diagnosis not present

## 2017-06-18 DIAGNOSIS — I442 Atrioventricular block, complete: Secondary | ICD-10-CM | POA: Diagnosis not present

## 2017-06-20 DIAGNOSIS — E109 Type 1 diabetes mellitus without complications: Secondary | ICD-10-CM | POA: Diagnosis not present

## 2017-06-23 NOTE — Consult Note (Signed)
Reason for Consult: Symptomatic bradycardia complete heart block Referring Physician: Dr. Tobi BastosPyreddy hospitalist  Royal PiedraArvell D Beverely PaceBryant is an 84 y.o. male.  HPI: Patient is a 84 year old white male with significant bradycardia was preop with Dr. Darrold JunkerParaschos for permanent pacemaker but got symptomatic with blood sugars of over 600 while in preoperative care patient was then admitted for evaluation and inpatient procedure for permanent pacemaker.  Patient denies any chest pain complains of lightheadedness dizziness vertigo patient was set up for outpatient permanent pacemaker but is now admitted for further assessment we will have the procedure done as an inpatient  Past Medical History:  Diagnosis Date  . Anemia   . Chronic kidney disease    "functioning at 50%"  . Coronary artery disease   . Diabetes mellitus, type 2 (HCC)   . Dysrhythmia   . Glaucoma   . Hypothyroidism   . Osteoporosis   . Pneumonia   . Sleep apnea    could not tolerate CPAP  . Wears dentures    partial upper and lower    Past Surgical History:  Procedure Laterality Date  . APPENDECTOMY    . BACK SURGERY     lumbar  . CARDIAC CATHETERIZATION  2006   stent x1  . CATARACT EXTRACTION W/PHACO Right 12/19/2016   Procedure: CATARACT EXTRACTION PHACO AND INTRAOCULAR LENS PLACEMENT (IOC) RIGHT DIABETIC;  Surgeon: Lockie MolaBrasington, Chadwick, MD;  [facility];  Service: Ophthalmology;  Laterality: Right;  . HERNIA REPAIR    . PACEMAKER INSERTION N/A 06/13/2017   Procedure: INSERTION PACEMAKER INITIAL DUAL CHAMBER INSERTION;  Surgeon: Marcina MillardParaschos, Alexander, MD;  Location: [facility];  Service: Cardiovascular;  Laterality: N/A;  . TRABECULECTOMY Right 12/19/2016   Procedure: [facility];  Surgeon: Lockie MolaBrasington, Chadwick, MD;  [facility];  Service: Ophthalmology;  Laterality: Right;  Diabetic - insulin pump sleep apnea    History reviewed. No pertinent family history.  Social  History:  reports that he quit smoking about 48 years ago. He quit after 30.00 years of use. He has never used smokeless tobacco. He reports that he does not drink alcohol or use drugs.  Allergies: No Known Allergies  Medications: I have reviewed the patient's current medications.  No results found for this or any previous visit (from the past 48 hour(s)).  No results found.  Review of Systems  Constitutional: Positive for malaise/fatigue.  HENT: Negative.   Eyes: Negative.   Respiratory: Positive for shortness of breath.   Cardiovascular: Positive for palpitations.  Gastrointestinal: Negative.   Genitourinary: Negative.   Musculoskeletal: Positive for myalgias.  Skin: Negative.   Neurological: Positive for dizziness and weakness.  Endo/Heme/Allergies: Negative.   Psychiatric/Behavioral: Negative.    Blood pressure (!) 163/72, pulse 83, temperature 97.9 F (36.6 C), temperature source Oral, resp. rate 18, height 5\' 6"  (1.676 m), weight 144 lb 3.2 oz (65.4 kg), SpO2 92 %. Physical Exam  Nursing note and vitals reviewed. Constitutional: He is oriented to person, place, and time. He appears well-developed and well-nourished.  HENT:  Head: Normocephalic and atraumatic.  Eyes: Pupils are equal, round, and reactive to light. Conjunctivae and EOM are normal.  Neck: Normal range of motion. Neck supple.  Cardiovascular: Regular rhythm and normal pulses. Bradycardia present.  Murmur heard.  Systolic murmur is present with a grade of 2/6. Respiratory: Effort normal and breath sounds normal.  GI: Soft. Bowel sounds are normal.  Musculoskeletal: Normal range of motion.  Neurological: He is alert and oriented to  person, place, and time. He has normal reflexes.  Skin: Skin is warm and dry.  Psychiatric: He has a normal mood and affect.    Assessment/Plan: Complete heart block Symptomatic bradycardia Diabetes elevated blood sugar Chronic renal  insufficiency Hypothyroidism Obstructive sleep apnea Coronary artery disease . Plan Agree with admission to telemetry Patient should be referred for permanent pacemaker prior to discharge We will consult Dr. Darrold Junker for permanent pacemaker Avoid rate blocking drugs Continue diabetes management and control Consider nephrology for evaluation of renal insufficiency Continue medical therapy for coronary disease  Dwayne D Callwood 06/23/2017, 8:30 AM

## 2017-06-24 DIAGNOSIS — J181 Lobar pneumonia, unspecified organism: Secondary | ICD-10-CM | POA: Diagnosis not present

## 2017-06-24 DIAGNOSIS — R05 Cough: Secondary | ICD-10-CM | POA: Diagnosis not present

## 2017-06-24 DIAGNOSIS — R001 Bradycardia, unspecified: Secondary | ICD-10-CM | POA: Diagnosis not present

## 2017-06-24 NOTE — Care Management (Signed)
Post discharge note entry 06/24/17- Patient declined homehealth services.

## 2017-06-26 DIAGNOSIS — E108 Type 1 diabetes mellitus with unspecified complications: Secondary | ICD-10-CM | POA: Diagnosis not present

## 2017-06-26 DIAGNOSIS — Z9641 Presence of insulin pump (external) (internal): Secondary | ICD-10-CM | POA: Diagnosis not present

## 2017-07-04 DIAGNOSIS — D510 Vitamin B12 deficiency anemia due to intrinsic factor deficiency: Secondary | ICD-10-CM | POA: Diagnosis not present

## 2017-07-04 DIAGNOSIS — E10649 Type 1 diabetes mellitus with hypoglycemia without coma: Secondary | ICD-10-CM | POA: Diagnosis not present

## 2017-07-04 DIAGNOSIS — E034 Atrophy of thyroid (acquired): Secondary | ICD-10-CM | POA: Diagnosis not present

## 2017-07-04 DIAGNOSIS — E103493 Type 1 diabetes mellitus with severe nonproliferative diabetic retinopathy without macular edema, bilateral: Secondary | ICD-10-CM | POA: Diagnosis not present

## 2017-07-04 DIAGNOSIS — E782 Mixed hyperlipidemia: Secondary | ICD-10-CM | POA: Diagnosis not present

## 2017-07-04 DIAGNOSIS — L97511 Non-pressure chronic ulcer of other part of right foot limited to breakdown of skin: Secondary | ICD-10-CM | POA: Diagnosis not present

## 2017-07-04 DIAGNOSIS — N183 Chronic kidney disease, stage 3 (moderate): Secondary | ICD-10-CM | POA: Diagnosis not present

## 2017-07-04 DIAGNOSIS — M810 Age-related osteoporosis without current pathological fracture: Secondary | ICD-10-CM | POA: Diagnosis not present

## 2017-07-04 DIAGNOSIS — Z8631 Personal history of diabetic foot ulcer: Secondary | ICD-10-CM | POA: Diagnosis not present

## 2017-07-04 DIAGNOSIS — Z9641 Presence of insulin pump (external) (internal): Secondary | ICD-10-CM | POA: Diagnosis not present

## 2017-07-04 DIAGNOSIS — E1022 Type 1 diabetes mellitus with diabetic chronic kidney disease: Secondary | ICD-10-CM | POA: Diagnosis not present

## 2017-07-04 DIAGNOSIS — E10621 Type 1 diabetes mellitus with foot ulcer: Secondary | ICD-10-CM | POA: Diagnosis not present

## 2017-07-16 DIAGNOSIS — I442 Atrioventricular block, complete: Secondary | ICD-10-CM | POA: Diagnosis not present

## 2017-07-16 DIAGNOSIS — R739 Hyperglycemia, unspecified: Secondary | ICD-10-CM | POA: Diagnosis not present

## 2017-07-16 DIAGNOSIS — J9601 Acute respiratory failure with hypoxia: Secondary | ICD-10-CM | POA: Diagnosis not present

## 2017-07-17 DIAGNOSIS — I10 Essential (primary) hypertension: Secondary | ICD-10-CM | POA: Diagnosis not present

## 2017-07-17 DIAGNOSIS — N183 Chronic kidney disease, stage 3 (moderate): Secondary | ICD-10-CM | POA: Diagnosis not present

## 2017-07-24 DIAGNOSIS — Z0000 Encounter for general adult medical examination without abnormal findings: Secondary | ICD-10-CM | POA: Diagnosis not present

## 2017-07-24 DIAGNOSIS — E109 Type 1 diabetes mellitus without complications: Secondary | ICD-10-CM | POA: Diagnosis not present

## 2017-07-24 DIAGNOSIS — N182 Chronic kidney disease, stage 2 (mild): Secondary | ICD-10-CM | POA: Diagnosis not present

## 2017-07-24 DIAGNOSIS — D649 Anemia, unspecified: Secondary | ICD-10-CM | POA: Diagnosis not present

## 2017-07-25 DIAGNOSIS — Z9641 Presence of insulin pump (external) (internal): Secondary | ICD-10-CM | POA: Diagnosis not present

## 2017-07-25 DIAGNOSIS — Z7189 Other specified counseling: Secondary | ICD-10-CM | POA: Diagnosis not present

## 2017-07-25 DIAGNOSIS — E108 Type 1 diabetes mellitus with unspecified complications: Secondary | ICD-10-CM | POA: Diagnosis not present

## 2017-08-16 DIAGNOSIS — J9601 Acute respiratory failure with hypoxia: Secondary | ICD-10-CM | POA: Diagnosis not present

## 2017-08-16 DIAGNOSIS — I442 Atrioventricular block, complete: Secondary | ICD-10-CM | POA: Diagnosis not present

## 2017-08-16 DIAGNOSIS — R739 Hyperglycemia, unspecified: Secondary | ICD-10-CM | POA: Diagnosis not present

## 2017-08-28 DIAGNOSIS — E109 Type 1 diabetes mellitus without complications: Secondary | ICD-10-CM | POA: Diagnosis not present

## 2017-08-29 DIAGNOSIS — H401131 Primary open-angle glaucoma, bilateral, mild stage: Secondary | ICD-10-CM | POA: Diagnosis not present

## 2017-09-03 DIAGNOSIS — H401131 Primary open-angle glaucoma, bilateral, mild stage: Secondary | ICD-10-CM | POA: Diagnosis not present

## 2017-09-15 DIAGNOSIS — I442 Atrioventricular block, complete: Secondary | ICD-10-CM | POA: Diagnosis not present

## 2017-09-15 DIAGNOSIS — J9601 Acute respiratory failure with hypoxia: Secondary | ICD-10-CM | POA: Diagnosis not present

## 2017-09-15 DIAGNOSIS — R739 Hyperglycemia, unspecified: Secondary | ICD-10-CM | POA: Diagnosis not present

## 2017-09-16 DIAGNOSIS — I10 Essential (primary) hypertension: Secondary | ICD-10-CM | POA: Diagnosis not present

## 2017-09-16 DIAGNOSIS — I2510 Atherosclerotic heart disease of native coronary artery without angina pectoris: Secondary | ICD-10-CM | POA: Diagnosis not present

## 2017-09-16 DIAGNOSIS — R0602 Shortness of breath: Secondary | ICD-10-CM | POA: Diagnosis not present

## 2017-10-01 DIAGNOSIS — E10649 Type 1 diabetes mellitus with hypoglycemia without coma: Secondary | ICD-10-CM | POA: Diagnosis not present

## 2017-10-01 DIAGNOSIS — E034 Atrophy of thyroid (acquired): Secondary | ICD-10-CM | POA: Diagnosis not present

## 2017-10-01 DIAGNOSIS — Z9641 Presence of insulin pump (external) (internal): Secondary | ICD-10-CM | POA: Diagnosis not present

## 2017-10-01 DIAGNOSIS — Z79899 Other long term (current) drug therapy: Secondary | ICD-10-CM | POA: Diagnosis not present

## 2017-10-07 DIAGNOSIS — E109 Type 1 diabetes mellitus without complications: Secondary | ICD-10-CM | POA: Diagnosis not present

## 2017-10-16 DIAGNOSIS — J9601 Acute respiratory failure with hypoxia: Secondary | ICD-10-CM | POA: Diagnosis not present

## 2017-10-16 DIAGNOSIS — R739 Hyperglycemia, unspecified: Secondary | ICD-10-CM | POA: Diagnosis not present

## 2017-10-16 DIAGNOSIS — I442 Atrioventricular block, complete: Secondary | ICD-10-CM | POA: Diagnosis not present

## 2017-10-23 DIAGNOSIS — D692 Other nonthrombocytopenic purpura: Secondary | ICD-10-CM | POA: Diagnosis not present

## 2017-10-23 DIAGNOSIS — L821 Other seborrheic keratosis: Secondary | ICD-10-CM | POA: Diagnosis not present

## 2017-10-23 DIAGNOSIS — E119 Type 2 diabetes mellitus without complications: Secondary | ICD-10-CM | POA: Diagnosis not present

## 2017-10-23 DIAGNOSIS — L578 Other skin changes due to chronic exposure to nonionizing radiation: Secondary | ICD-10-CM | POA: Diagnosis not present

## 2017-10-23 DIAGNOSIS — S0101XA Laceration without foreign body of scalp, initial encounter: Secondary | ICD-10-CM | POA: Diagnosis not present

## 2017-11-16 DIAGNOSIS — I442 Atrioventricular block, complete: Secondary | ICD-10-CM | POA: Diagnosis not present

## 2017-11-16 DIAGNOSIS — J9601 Acute respiratory failure with hypoxia: Secondary | ICD-10-CM | POA: Diagnosis not present

## 2017-11-16 DIAGNOSIS — R739 Hyperglycemia, unspecified: Secondary | ICD-10-CM | POA: Diagnosis not present

## 2017-11-18 DIAGNOSIS — E109 Type 1 diabetes mellitus without complications: Secondary | ICD-10-CM | POA: Diagnosis not present

## 2017-11-19 DIAGNOSIS — I442 Atrioventricular block, complete: Secondary | ICD-10-CM | POA: Diagnosis not present

## 2017-12-16 DIAGNOSIS — I442 Atrioventricular block, complete: Secondary | ICD-10-CM | POA: Diagnosis not present

## 2017-12-16 DIAGNOSIS — R739 Hyperglycemia, unspecified: Secondary | ICD-10-CM | POA: Diagnosis not present

## 2017-12-16 DIAGNOSIS — J9601 Acute respiratory failure with hypoxia: Secondary | ICD-10-CM | POA: Diagnosis not present

## 2017-12-27 DIAGNOSIS — E109 Type 1 diabetes mellitus without complications: Secondary | ICD-10-CM | POA: Diagnosis not present

## 2018-01-03 DIAGNOSIS — H401131 Primary open-angle glaucoma, bilateral, mild stage: Secondary | ICD-10-CM | POA: Diagnosis not present

## 2018-01-09 DIAGNOSIS — Z8631 Personal history of diabetic foot ulcer: Secondary | ICD-10-CM | POA: Diagnosis not present

## 2018-01-09 DIAGNOSIS — Z9641 Presence of insulin pump (external) (internal): Secondary | ICD-10-CM | POA: Diagnosis not present

## 2018-01-09 DIAGNOSIS — D510 Vitamin B12 deficiency anemia due to intrinsic factor deficiency: Secondary | ICD-10-CM | POA: Diagnosis not present

## 2018-01-09 DIAGNOSIS — Z79899 Other long term (current) drug therapy: Secondary | ICD-10-CM | POA: Diagnosis not present

## 2018-01-09 DIAGNOSIS — M810 Age-related osteoporosis without current pathological fracture: Secondary | ICD-10-CM | POA: Diagnosis not present

## 2018-01-09 DIAGNOSIS — R0989 Other specified symptoms and signs involving the circulatory and respiratory systems: Secondary | ICD-10-CM | POA: Diagnosis not present

## 2018-01-09 DIAGNOSIS — E034 Atrophy of thyroid (acquired): Secondary | ICD-10-CM | POA: Diagnosis not present

## 2018-01-09 DIAGNOSIS — E10649 Type 1 diabetes mellitus with hypoglycemia without coma: Secondary | ICD-10-CM | POA: Diagnosis not present

## 2018-01-09 DIAGNOSIS — E782 Mixed hyperlipidemia: Secondary | ICD-10-CM | POA: Diagnosis not present

## 2018-01-16 DIAGNOSIS — R739 Hyperglycemia, unspecified: Secondary | ICD-10-CM | POA: Diagnosis not present

## 2018-01-16 DIAGNOSIS — I442 Atrioventricular block, complete: Secondary | ICD-10-CM | POA: Diagnosis not present

## 2018-01-16 DIAGNOSIS — J9601 Acute respiratory failure with hypoxia: Secondary | ICD-10-CM | POA: Diagnosis not present

## 2018-02-07 DIAGNOSIS — E109 Type 1 diabetes mellitus without complications: Secondary | ICD-10-CM | POA: Diagnosis not present

## 2018-02-11 DIAGNOSIS — E109 Type 1 diabetes mellitus without complications: Secondary | ICD-10-CM | POA: Diagnosis not present

## 2018-02-11 DIAGNOSIS — H401131 Primary open-angle glaucoma, bilateral, mild stage: Secondary | ICD-10-CM | POA: Diagnosis not present

## 2018-02-15 DIAGNOSIS — R739 Hyperglycemia, unspecified: Secondary | ICD-10-CM | POA: Diagnosis not present

## 2018-02-15 DIAGNOSIS — I442 Atrioventricular block, complete: Secondary | ICD-10-CM | POA: Diagnosis not present

## 2018-02-15 DIAGNOSIS — J9601 Acute respiratory failure with hypoxia: Secondary | ICD-10-CM | POA: Diagnosis not present

## 2018-02-26 DIAGNOSIS — G4733 Obstructive sleep apnea (adult) (pediatric): Secondary | ICD-10-CM | POA: Diagnosis not present

## 2018-02-26 DIAGNOSIS — R001 Bradycardia, unspecified: Secondary | ICD-10-CM | POA: Diagnosis not present

## 2018-02-26 DIAGNOSIS — R0602 Shortness of breath: Secondary | ICD-10-CM | POA: Diagnosis not present

## 2018-02-26 DIAGNOSIS — I442 Atrioventricular block, complete: Secondary | ICD-10-CM | POA: Diagnosis not present

## 2018-02-26 DIAGNOSIS — N183 Chronic kidney disease, stage 3 (moderate): Secondary | ICD-10-CM | POA: Diagnosis not present

## 2018-02-26 DIAGNOSIS — R42 Dizziness and giddiness: Secondary | ICD-10-CM | POA: Diagnosis not present

## 2018-02-26 DIAGNOSIS — E782 Mixed hyperlipidemia: Secondary | ICD-10-CM | POA: Diagnosis not present

## 2018-02-26 DIAGNOSIS — I10 Essential (primary) hypertension: Secondary | ICD-10-CM | POA: Diagnosis not present

## 2018-02-26 DIAGNOSIS — I2510 Atherosclerotic heart disease of native coronary artery without angina pectoris: Secondary | ICD-10-CM | POA: Diagnosis not present

## 2018-03-18 DIAGNOSIS — I442 Atrioventricular block, complete: Secondary | ICD-10-CM | POA: Diagnosis not present

## 2018-03-18 DIAGNOSIS — J9601 Acute respiratory failure with hypoxia: Secondary | ICD-10-CM | POA: Diagnosis not present

## 2018-03-18 DIAGNOSIS — J069 Acute upper respiratory infection, unspecified: Secondary | ICD-10-CM | POA: Diagnosis not present

## 2018-03-18 DIAGNOSIS — R739 Hyperglycemia, unspecified: Secondary | ICD-10-CM | POA: Diagnosis not present

## 2018-03-18 DIAGNOSIS — E1069 Type 1 diabetes mellitus with other specified complication: Secondary | ICD-10-CM | POA: Diagnosis not present

## 2018-04-08 DIAGNOSIS — E109 Type 1 diabetes mellitus without complications: Secondary | ICD-10-CM | POA: Diagnosis not present

## 2018-04-10 DIAGNOSIS — Z9641 Presence of insulin pump (external) (internal): Secondary | ICD-10-CM | POA: Diagnosis not present

## 2018-04-10 DIAGNOSIS — Z79899 Other long term (current) drug therapy: Secondary | ICD-10-CM | POA: Diagnosis not present

## 2018-04-10 DIAGNOSIS — E108 Type 1 diabetes mellitus with unspecified complications: Secondary | ICD-10-CM | POA: Diagnosis not present

## 2018-04-16 DIAGNOSIS — Z9641 Presence of insulin pump (external) (internal): Secondary | ICD-10-CM | POA: Diagnosis not present

## 2018-04-16 DIAGNOSIS — E108 Type 1 diabetes mellitus with unspecified complications: Secondary | ICD-10-CM | POA: Diagnosis not present

## 2018-04-16 DIAGNOSIS — Z7189 Other specified counseling: Secondary | ICD-10-CM | POA: Diagnosis not present

## 2018-04-18 DIAGNOSIS — J9601 Acute respiratory failure with hypoxia: Secondary | ICD-10-CM | POA: Diagnosis not present

## 2018-04-18 DIAGNOSIS — I442 Atrioventricular block, complete: Secondary | ICD-10-CM | POA: Diagnosis not present

## 2018-04-18 DIAGNOSIS — R739 Hyperglycemia, unspecified: Secondary | ICD-10-CM | POA: Diagnosis not present

## 2018-04-19 IMAGING — DX DG CHEST 1V PORT
1 series · 1 of 1 positions shown · non-contrast
Comparison: Portable exam 7444 hours compared to 06/12/2017

CLINICAL DATA: Post pacemaker insertion

EXAM:
PORTABLE CHEST 1 VIEW

[chest ap]
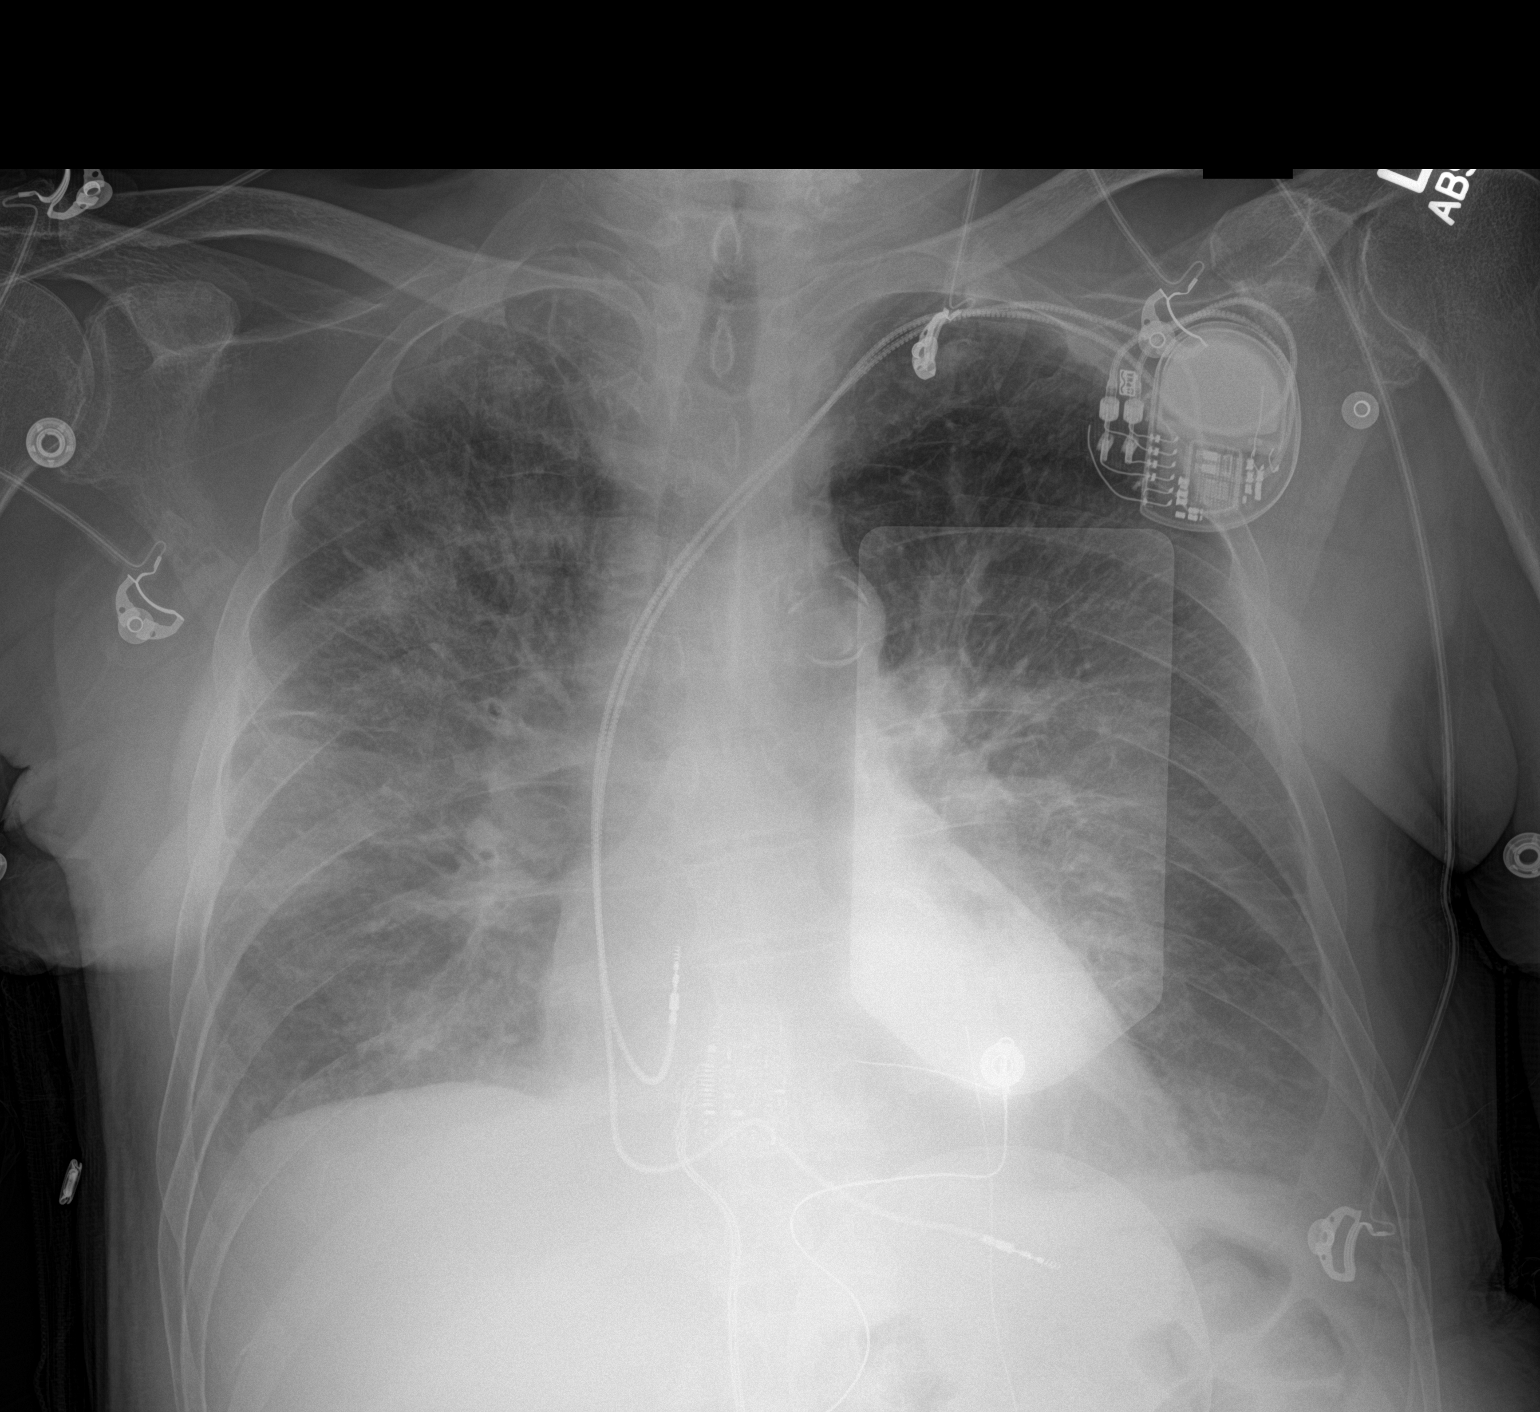

[1 of 1 positions shown; findings below may reference images not displayed]

FINDINGS: New LEFT subclavian transvenous pacemaker with leads projecting over
RIGHT atrium and RIGHT ventricle.

External pacing leads present.

Normal heart size and mediastinal contours.

Atherosclerotic calcification aorta.

Increased perihilar infiltrates likely pulmonary edema.

No gross pleural effusion or pneumothorax.
IMPRESSION: Pacemaker leads project over RIGHT atrium and RIGHT ventricle.

Increased pulmonary edema.

## 2018-05-13 ENCOUNTER — Encounter: Payer: Self-pay | Admitting: Emergency Medicine

## 2018-05-13 ENCOUNTER — Emergency Department
Admission: EM | Admit: 2018-05-13 | Discharge: 2018-05-13 | Disposition: A | Discharge status: Home / Self Care | Payer: Medicare HMO | Attending: Emergency Medicine | Admitting: Emergency Medicine

## 2018-05-13 ENCOUNTER — Other Ambulatory Visit: Payer: Self-pay

## 2018-05-13 ENCOUNTER — Emergency Department: Payer: Medicare HMO

## 2018-05-13 DIAGNOSIS — Z79899 Other long term (current) drug therapy: Secondary | ICD-10-CM | POA: Diagnosis not present

## 2018-05-13 DIAGNOSIS — Y998 Other external cause status: Secondary | ICD-10-CM | POA: Insufficient documentation

## 2018-05-13 DIAGNOSIS — Z794 Long term (current) use of insulin: Secondary | ICD-10-CM | POA: Insufficient documentation

## 2018-05-13 DIAGNOSIS — S61301A Unspecified open wound of left index finger with damage to nail, initial encounter: Secondary | ICD-10-CM | POA: Insufficient documentation

## 2018-05-13 DIAGNOSIS — N189 Chronic kidney disease, unspecified: Secondary | ICD-10-CM | POA: Diagnosis not present

## 2018-05-13 DIAGNOSIS — S62631A Displaced fracture of distal phalanx of left index finger, initial encounter for closed fracture: Secondary | ICD-10-CM | POA: Diagnosis not present

## 2018-05-13 DIAGNOSIS — Z7982 Long term (current) use of aspirin: Secondary | ICD-10-CM | POA: Diagnosis not present

## 2018-05-13 DIAGNOSIS — Z87891 Personal history of nicotine dependence: Secondary | ICD-10-CM | POA: Insufficient documentation

## 2018-05-13 DIAGNOSIS — S62631B Displaced fracture of distal phalanx of left index finger, initial encounter for open fracture: Secondary | ICD-10-CM | POA: Diagnosis not present

## 2018-05-13 DIAGNOSIS — Y929 Unspecified place or not applicable: Secondary | ICD-10-CM | POA: Insufficient documentation

## 2018-05-13 DIAGNOSIS — E1122 Type 2 diabetes mellitus with diabetic chronic kidney disease: Secondary | ICD-10-CM | POA: Insufficient documentation

## 2018-05-13 DIAGNOSIS — Z950 Presence of cardiac pacemaker: Secondary | ICD-10-CM | POA: Insufficient documentation

## 2018-05-13 DIAGNOSIS — Z23 Encounter for immunization: Secondary | ICD-10-CM | POA: Insufficient documentation

## 2018-05-13 DIAGNOSIS — S61211A Laceration without foreign body of left index finger without damage to nail, initial encounter: Secondary | ICD-10-CM | POA: Diagnosis not present

## 2018-05-13 DIAGNOSIS — E039 Hypothyroidism, unspecified: Secondary | ICD-10-CM | POA: Insufficient documentation

## 2018-05-13 DIAGNOSIS — W312XXA Contact with powered woodworking and forming machines, initial encounter: Secondary | ICD-10-CM | POA: Insufficient documentation

## 2018-05-13 DIAGNOSIS — Y9389 Activity, other specified: Secondary | ICD-10-CM | POA: Insufficient documentation

## 2018-05-13 DIAGNOSIS — I2510 Atherosclerotic heart disease of native coronary artery without angina pectoris: Secondary | ICD-10-CM | POA: Diagnosis not present

## 2018-05-13 DIAGNOSIS — S61209A Unspecified open wound of unspecified finger without damage to nail, initial encounter: Secondary | ICD-10-CM

## 2018-05-13 DIAGNOSIS — S6992XA Unspecified injury of left wrist, hand and finger(s), initial encounter: Secondary | ICD-10-CM | POA: Diagnosis present

## 2018-05-13 DIAGNOSIS — S62621A Displaced fracture of medial phalanx of left index finger, initial encounter for closed fracture: Secondary | ICD-10-CM | POA: Diagnosis not present

## 2018-05-13 MED ORDER — LIDOCAINE HCL (PF) 1 % IJ SOLN
10.0000 mL | Freq: Once | INTRAMUSCULAR | Status: AC
  Administered 2018-05-13: 10 mL
  Filled 2018-05-13: qty 10

## 2018-05-13 MED ORDER — TRAMADOL HCL 50 MG PO TABS: 50.0000 mg | ORAL_TABLET | Freq: Four times a day (QID) | ORAL | 0 refills | Status: DC | PRN

## 2018-05-13 MED ORDER — TETANUS-DIPHTH-ACELL PERTUSSIS 5-2.5-18.5 LF-MCG/0.5 IM SUSP
0.5000 mL | Freq: Once | INTRAMUSCULAR | Status: AC
  Administered 2018-05-13: 0.5 mL via INTRAMUSCULAR
  Filled 2018-05-13: qty 0.5

## 2018-05-13 MED ORDER — CEFAZOLIN SODIUM-DEXTROSE 1-4 GM/50ML-% IV SOLN
1.0000 g | Freq: Once | INTRAVENOUS | Status: AC
  Administered 2018-05-13: 1 g via INTRAVENOUS
  Filled 2018-05-13: qty 50

## 2018-05-13 MED ORDER — CEPHALEXIN 500 MG PO CAPS: 500.0000 mg | ORAL_CAPSULE | Freq: Three times a day (TID) | ORAL | 0 refills | Status: DC

## 2018-05-13 NOTE — Discharge Instructions (Addendum)
Take the medication as prescribed.  Tramadol for pain only if needed.  You may also take Tylenol.  Return to the emergency department if the area is worsening.  If you soak through the bandage you may change it but reapply the splint.  I would prefer you not get this area wet and just leave the bandage alone until you can be rechecked by Dr. Odis Luster in 2 days.

## 2018-05-13 NOTE — ED Provider Notes (Signed)
[facility] Emergency Department Provider Note  ____________________________________________   First MD Initiated Contact with Patient 05/13/18 1118     (approximate)  I have reviewed the triage vital signs and the nursing notes.   HISTORY  Chief Complaint Laceration    HPI Logan Strong is a 85 y.o. male presents to the emergency department complaining of lacerations to the left second third and fourth fingers from a table saw.  He is unsure of his last tetanus.  He denies any other injuries at this time.    Past Medical History:  Diagnosis Date  . Anemia   . Chronic kidney disease    "functioning at 50%"  . Coronary artery disease   . Diabetes mellitus, type 2 (HCC)   . Dysrhythmia   . Glaucoma   . Hypothyroidism   . Osteoporosis   . Pneumonia   . Sleep apnea    could not tolerate CPAP  . Wears dentures    partial upper and lower    Patient Active Problem List   Diagnosis Date Noted  . Hyperglycemia 06/12/2017  . Pneumonia 06/07/2017    Past Surgical History:  Procedure Laterality Date  . APPENDECTOMY    . BACK SURGERY     lumbar  . CARDIAC CATHETERIZATION  2006   stent x1  . CATARACT EXTRACTION W/PHACO Right 12/19/2016   Procedure: CATARACT EXTRACTION PHACO AND INTRAOCULAR LENS PLACEMENT (IOC) RIGHT DIABETIC;  Surgeon: Lockie Mola, MD;  Location: [facility];  Service: Ophthalmology;  Laterality: Right;  . HERNIA REPAIR    . PACEMAKER INSERTION N/A 06/13/2017   Procedure: INSERTION PACEMAKER INITIAL DUAL CHAMBER INSERTION;  Surgeon: Marcina Millard, MD;  Location: [facility] ORS;  Service: Cardiovascular;  Laterality: N/A;  . TRABECULECTOMY Right 12/19/2016   Procedure: TRABECULECTOMY WITH [facility] AND EXPRESS SHUNT;  Surgeon: Lockie Mola, MD;  Location: [facility];  Service: Ophthalmology;  Laterality: Right;  Diabetic - insulin pump sleep apnea    Prior to Admission medications   Medication Sig  Start Date End Date Taking? Authorizing Provider  aspirin 81 MG tablet Take 81 mg by mouth daily.    [provider]  brimonidine (ALPHAGAN) 0.2 % ophthalmic solution Place into the right eye 2 (two) times daily.     [provider]  CALCIUM CARBONATE-VITAMIN D PO Take 1 tablet by mouth daily.     [provider]  cephALEXin (KEFLEX) 500 MG capsule Take 1 capsule (500 mg total) by mouth 3 (three) times daily. 05/13/18   Fisher, Roselyn Bering, PA-C  diphenhydramine-acetaminophen (TYLENOL PM) 25-500 MG TABS tablet Take 1 tablet by mouth at bedtime as needed.    [provider]  dorzolamide (TRUSOPT) 2 % ophthalmic solution Place 1 drop into the right eye 2 (two) times daily.     [provider]  furosemide (LASIX) 20 MG tablet Take 1 tablet (20 mg total) by mouth daily. 06/16/17 07/16/17  Houston Siren, MD  insulin aspart (NOVOLOG) 100 UNIT/ML injection Inject 30-50 Units into the skin daily. In pump.  [place]il and bolus before each meal     [provider]  insulin glargine (LANTUS) 100 UNIT/ML injection Inject 15 Units into the skin as needed.     [provider]  latanoprost (XALATAN) 0.005 % ophthalmic solution Place 1 drop into the right eye at bedtime.     [provider]  levothyroxine (SYNTHROID, LEVOTHROID) 50 MCG tablet Take 50 mcg by mouth daily before breakfast.  [provider]  lisinopril (PRINIVIL,ZESTRIL) 5 MG tablet Take 5 mg by mouth daily.     [provider]  Omega-3 Fatty Acids (FISH OIL PO) Take 1 capsule by mouth daily.     [provider]  permethrin (ELIMITE) 5 % cream Apply 1 application topically daily.    [provider]  simvastatin (ZOCOR) 40 MG tablet Take 40 mg by mouth daily.    [provider]  timolol (BETIMOL) 0.5 % ophthalmic solution Place 1 drop into both eyes 2 (two) times daily.    [provider]  traMADol (ULTRAM) 50 MG tablet Take 1 tablet (50  mg total) by mouth every 6 (six) hours as needed. 05/13/18   Fisher, Roselyn Bering, PA-C  vitamin B-12 (CYANOCOBALAMIN) 1000 MCG tablet Take 1,000 mcg by mouth 2 (two) times daily.     [provider]    Allergies Patient has no known allergies.  History reviewed. No pertinent family history.  Social History Social History   Tobacco Use  . Smoking status: Former Smoker    Years: 30.00    Last attempt to quit: 1971    Years since quitting: 49.2  . Smokeless tobacco: Never Used  Substance Use Topics  . Alcohol use: Never    Frequency: Never  . Drug use: Never    Review of Systems  Constitutional: No fever/chills Eyes: No visual changes. ENT: No sore throat. Respiratory: Denies cough Genitourinary: Negative for dysuria. Musculoskeletal: Negative for back pain.  Positive for left hand pain with lacerations Skin: Negative for rash.    ____________________________________________   PHYSICAL EXAM:  VITAL SIGNS: ED Triage Vitals  Enc Vitals Group     BP 05/13/18 1035 (!) 156/66     Pulse Rate 05/13/18 1035 83     Resp 05/13/18 1428 18     Temp 05/13/18 1035 98.1 F (36.7 C)     Temp Source 05/13/18 1035 Oral     SpO2 05/13/18 1035 99 %     Weight 05/13/18 1044 127 lb (57.6 kg)     Height 05/13/18 1044 5\' 6"  (1.676 m)     Head Circumference --      Peak Flow --      Pain Score 05/13/18 1043 6     Pain Loc --      Pain Edu? --      Excl. in GC? --     Constitutional: Alert and oriented. Well appearing and in no acute distress. Eyes: Conjunctivae are normal.  Head: Atraumatic. Nose: No congestion/rhinnorhea. Mouth/Throat: Mucous membranes are moist.   Neck:  supple no lymphadenopathy noted Cardiovascular: Normal rate, regular rhythm.  Respiratory: Normal respiratory effort.  No retractions Abd: soft nontender bs normal all 4 quad GU: deferred Musculoskeletal: The left hand has multiple lacerations.  Left index finger has a lack that his split the distal  end of the finger, almost into a Y-shaped.  The nail is intact, bone is exposed, the left third finger has an avulsion at the distal aspect, the left fourth finger also has a skin avulsion at the distal aspect, no foreign body is noted neurologic:  Normal speech and language.  Skin:  Skin is warm, dry, lacerations noted no rash noted. Psychiatric: Mood and affect are normal. Speech and behavior are normal.  ____________________________________________   LABS (all labs ordered are listed, but only abnormal results are displayed)  Labs Reviewed - No data to display ____________________________________________   ____________________________________________  RADIOLOGY  Cathlean Sauer  of the left hand shows a severely comminuted and displaced fracture of the left index finger, fracture at the tip of the middle finger, these are open fractures  ____________________________________________   PROCEDURES  Procedure(s) performed:   Marland KitchenMarland KitchenLaceration Repair Date/Time: 05/13/2018 5:27 PM Performed by: Faythe Ghee, PA-C Authorized by: Faythe Ghee, PA-C   Consent:    Consent obtained:  Verbal   Consent given by:  Patient   Risks discussed:  Infection, pain, retained foreign body, tendon damage, vascular damage, poor wound healing, poor cosmetic result, need for additional repair and nerve damage   Alternatives discussed:  Referral Anesthesia (see MAR for exact dosages):    Anesthesia method:  Nerve block   Block needle gauge:  25 G   Block anesthetic:  Lidocaine 1% w/o epi   Block injection procedure:  Anatomic landmarks identified, introduced needle, incremental injection, anatomic landmarks palpated and negative aspiration for blood   Block outcome:  Anesthesia achieved Laceration details:    Location:  Finger   Finger location:  L index finger   Length (cm):  3 Repair type:    Repair type:  Intermediate Pre-procedure details:    Preparation:  Patient was prepped and draped in usual  sterile fashion Exploration:    Hemostasis achieved with:  Direct pressure   Wound exploration: wound explored through full range of motion     Wound extent: underlying fracture     Wound extent: no foreign bodies/material noted, no muscle damage noted, no nerve damage noted, no tendon damage noted and no vascular damage noted     Contaminated: yes   Treatment:    Area cleansed with:  Betadine and saline   Amount of cleaning:  Extensive   Irrigation solution:  Sterile saline   Irrigation volume:  2 L   Irrigation method:  Syringe and tap Skin repair:    Repair method:  Sutures   Suture size:  5-0   Suture material:  Nylon   Suture technique:  Simple interrupted   Number of sutures:  10 Approximation:    Approximation:  Close Post-procedure details:    Dressing:  Non-adherent dressing and splint for protection   Patient tolerance of procedure:  Tolerated well, no immediate complications Comments:     The left middle and ring finger were avulsions the bandages were applied.  These are all open fractures.      ____________________________________________   INITIAL IMPRESSION / ASSESSMENT AND PLAN / ED COURSE  Pertinent labs & imaging results that were available during my care of the patient were reviewed by me and considered in my medical decision making (see chart for details).   Patient is 85 year old male presents emergency department with a laceration to the left hand.  The left index finger has a severe laceration with open bone noted.  The left middle finger has a avulsion at the tip of the distal aspect.  The left ring finger has a skin avulsion at the distal end  X-ray of the left hand shows a severely displaced fracture of the distal aspect of the index finger, fracture noted at the distal end of the middle finger  Explained all the findings to the patient.  Due to this being an open fracture he was given Ancef 1 g IV. See procedure note for repair The area was  splinted with a volar OCL to protect the fingers.  He is to see Dr. Odis Luster on Thursday at 1030.  A appointment was made for him via  the phone.  He was given a prescription for Keflex 500 3 times daily and tramadol if needed for pain.  He is to keep the area dry until evaluated by orthopedics.  He states he understands will comply.  He is to return emergency department if any other concerns.  Tdap was updated while here in the ED     As part of my medical decision making, I reviewed the following data within the electronic MEDICAL RECORD NUMBER Nursing notes reviewed and incorporated, Old chart reviewed, Radiograph reviewed see above, Notes from prior ED visits and [place] Controlled Substance Database  ____________________________________________   FINAL CLINICAL IMPRESSION(S) / ED DIAGNOSES  Final diagnoses:  Open displaced fracture of distal phalanx of left index finger, initial encounter  Avulsion of skin of finger, initial encounter      NEW MEDICATIONS STARTED DURING THIS VISIT:  Discharge Medication List as of 05/13/2018  2:15 PM    START taking these medications   Details  cephALEXin (KEFLEX) 500 MG capsule Take 1 capsule (500 mg total) by mouth 3 (three) times daily., Starting Tue 05/13/2018, Normal    traMADol (ULTRAM) 50 MG tablet Take 1 tablet (50 mg total) by mouth every 6 (six) hours as needed., Starting Tue 05/13/2018, Normal         Note:  This document was prepared using Dragon voice recognition software and may include unintentional dictation errors.    Faythe Ghee, PA-C 05/13/18 1731    Rockne Menghini, MD 05/27/18 [phone]

## 2018-05-13 NOTE — ED Triage Notes (Signed)
Pt in via POV with lacerations to left index and middle fingers, pt reports cutting them with a table saw.  Dry dressing placed per this RN.

## 2018-05-13 NOTE — ED Notes (Signed)
See triage note presents with lacerations noted to left index and middle fingers from table saw.

## 2018-05-13 NOTE — ED Notes (Signed)
Left hand placed in betadine/saline

## 2018-05-15 ENCOUNTER — Emergency Department
Admission: EM | Admit: 2018-05-15 | Discharge: 2018-05-15 | Disposition: A | Discharge status: Home / Self Care | Payer: Medicare HMO | Attending: Emergency Medicine | Admitting: Emergency Medicine

## 2018-05-15 ENCOUNTER — Other Ambulatory Visit: Payer: Self-pay

## 2018-05-15 ENCOUNTER — Emergency Department: Payer: Medicare HMO

## 2018-05-15 ENCOUNTER — Encounter: Payer: Self-pay | Admitting: Emergency Medicine

## 2018-05-15 DIAGNOSIS — M25532 Pain in left wrist: Secondary | ICD-10-CM | POA: Insufficient documentation

## 2018-05-15 DIAGNOSIS — Z87891 Personal history of nicotine dependence: Secondary | ICD-10-CM | POA: Insufficient documentation

## 2018-05-15 DIAGNOSIS — N189 Chronic kidney disease, unspecified: Secondary | ICD-10-CM | POA: Insufficient documentation

## 2018-05-15 DIAGNOSIS — E039 Hypothyroidism, unspecified: Secondary | ICD-10-CM | POA: Insufficient documentation

## 2018-05-15 DIAGNOSIS — Z7982 Long term (current) use of aspirin: Secondary | ICD-10-CM | POA: Insufficient documentation

## 2018-05-15 DIAGNOSIS — Z4801 Encounter for change or removal of surgical wound dressing: Secondary | ICD-10-CM | POA: Diagnosis not present

## 2018-05-15 DIAGNOSIS — S62663B Nondisplaced fracture of distal phalanx of left middle finger, initial encounter for open fracture: Secondary | ICD-10-CM | POA: Diagnosis not present

## 2018-05-15 DIAGNOSIS — Z950 Presence of cardiac pacemaker: Secondary | ICD-10-CM | POA: Insufficient documentation

## 2018-05-15 DIAGNOSIS — I2510 Atherosclerotic heart disease of native coronary artery without angina pectoris: Secondary | ICD-10-CM | POA: Insufficient documentation

## 2018-05-15 DIAGNOSIS — Z794 Long term (current) use of insulin: Secondary | ICD-10-CM | POA: Insufficient documentation

## 2018-05-15 DIAGNOSIS — E1122 Type 2 diabetes mellitus with diabetic chronic kidney disease: Secondary | ICD-10-CM | POA: Diagnosis not present

## 2018-05-15 DIAGNOSIS — Z5189 Encounter for other specified aftercare: Secondary | ICD-10-CM

## 2018-05-15 DIAGNOSIS — Z4800 Encounter for change or removal of nonsurgical wound dressing: Secondary | ICD-10-CM | POA: Diagnosis not present

## 2018-05-15 DIAGNOSIS — Z79899 Other long term (current) drug therapy: Secondary | ICD-10-CM | POA: Diagnosis not present

## 2018-05-15 DIAGNOSIS — S62665B Nondisplaced fracture of distal phalanx of left ring finger, initial encounter for open fracture: Secondary | ICD-10-CM | POA: Diagnosis not present

## 2018-05-15 DIAGNOSIS — S62661B Nondisplaced fracture of distal phalanx of left index finger, initial encounter for open fracture: Secondary | ICD-10-CM | POA: Diagnosis not present

## 2018-05-15 DIAGNOSIS — M19032 Primary osteoarthritis, left wrist: Secondary | ICD-10-CM | POA: Diagnosis not present

## 2018-05-15 LAB — CBC WITH DIFFERENTIAL/PLATELET
Abs Immature Granulocytes: 0.03 10*3/uL (ref 0.00–0.07)
BASOS ABS: 0.1 10*3/uL (ref 0.0–0.1)
BASOS PCT: 1 %
EOS ABS: 0 10*3/uL (ref 0.0–0.5)
Eosinophils Relative: 0 %
HCT: 35.8 % — ABNORMAL LOW (ref 39.0–52.0)
Hemoglobin: 11.8 g/dL — ABNORMAL LOW (ref 13.0–17.0)
IMMATURE GRANULOCYTES: 0 %
Lymphocytes Relative: 11 %
Lymphs Abs: 0.9 10*3/uL (ref 0.7–4.0)
MCH: 29.8 pg (ref 26.0–34.0)
MCHC: 33 g/dL (ref 30.0–36.0)
MCV: 90.4 fL (ref 80.0–100.0)
Monocytes Absolute: 0.7 10*3/uL (ref 0.1–1.0)
Monocytes Relative: 8 %
NEUTROS PCT: 80 %
NRBC: 0 % (ref 0.0–0.2)
Neutro Abs: 6.8 10*3/uL (ref 1.7–7.7)
PLATELETS: UNDETERMINED 10*3/uL (ref 150–400)
RBC: 3.96 MIL/uL — AB (ref 4.22–5.81)
RDW: 13.7 % (ref 11.5–15.5)
WBC: 8.5 10*3/uL (ref 4.0–10.5)

## 2018-05-15 LAB — BASIC METABOLIC PANEL
ANION GAP: 8 (ref 5–15)
BUN: 17 mg/dL (ref 8–23)
CALCIUM: 9.1 mg/dL (ref 8.9–10.3)
CO2: 22 mmol/L (ref 22–32)
Chloride: 107 mmol/L (ref 98–111)
Creatinine, Ser: 1.1 mg/dL (ref 0.61–1.24)
Glucose, Bld: 188 mg/dL — ABNORMAL HIGH (ref 70–99)
Potassium: 4.1 mmol/L (ref 3.5–5.1)
Sodium: 137 mmol/L (ref 135–145)

## 2018-05-15 LAB — SEDIMENTATION RATE: Sed Rate: 23 mm/hr — ABNORMAL HIGH (ref 0–20)

## 2018-05-15 NOTE — Discharge Instructions (Addendum)
Please go to your appointment now Dr. Odis Luster will check the blood work that we drew.  I do not want you to miss the appointment.

## 2018-05-15 NOTE — ED Notes (Signed)
PT resting quietly on ER stretcher.  Pt states increase pain in hand.  States orthopedics appointment at 10AM.

## 2018-05-15 NOTE — ED Triage Notes (Signed)
Patient ambulatory to triage with steady gait, without difficulty or distress noted; pt seen 3/3 for left finger injury/fx; has orthopedic appt this am but having increased pain/swelling; ace wrap in place, saturated with blood; taking keflex & tramadol

## 2018-05-15 NOTE — ED Provider Notes (Signed)
[facility] Emergency Department Provider Note   ____________________________________________   First MD Initiated Contact with Patient 05/15/18 [phone]     (approximate)  I have reviewed the triage vital signs and the nursing notes.   HISTORY  Chief Complaint Wound Check    HPI Logan Strong is a 85 y.o. male comes in because of complaints of pain in his finger and wrist.  He had a saw injury on the third of this month which was partially repaired in the emergency room.  Today his wrist and index finger were throbbing badly so he came in here.         Past Medical History:  Diagnosis Date  . Anemia   . Chronic kidney disease    "functioning at 50%"  . Coronary artery disease   . Diabetes mellitus, type 2 (HCC)   . Dysrhythmia   . Glaucoma   . Hypothyroidism   . Osteoporosis   . Pneumonia   . Sleep apnea    could not tolerate CPAP  . Wears dentures    partial upper and lower    Patient Active Problem List   Diagnosis Date Noted  . Hyperglycemia 06/12/2017  . Pneumonia 06/07/2017    Past Surgical History:  Procedure Laterality Date  . APPENDECTOMY    . BACK SURGERY     lumbar  . CARDIAC CATHETERIZATION  2006   stent x1  . CATARACT EXTRACTION W/PHACO Right 12/19/2016   Procedure: CATARACT EXTRACTION PHACO AND INTRAOCULAR LENS PLACEMENT (IOC) RIGHT DIABETIC;  Surgeon: Lockie Mola, MD;  Location: [facility];  Service: Ophthalmology;  Laterality: Right;  . HERNIA REPAIR    . PACEMAKER INSERTION N/A 06/13/2017   Procedure: INSERTION PACEMAKER INITIAL DUAL CHAMBER INSERTION;  Surgeon: Marcina Millard, MD;  Location: [facility] OR[facility];  Service: Cardiovascular;  Laterality: N/A;  . TRABECULECTOMY Right 12/19/2016   Procedure: TRABECULECTOMY WITH [facility] AND EXPRESS SHUNT;  Surgeon: Lockie Mola, MD;  Location: [facility];  Service: Ophthalmology;  Laterality: Right;  Diabetic - insulin pump sleep apnea     Prior to Admission medications   Medication Sig Start Date End Date Taking? Authorizing Provider  aspirin 81 MG tablet Take 81 mg by mouth daily.   Yes [provider]  CALCIUM CARBONATE-VITAMIN D PO Take 1 tablet by mouth daily.    Yes [provider]  cephALEXin (KEFLEX) 500 MG capsule Take 1 capsule (500 mg total) by mouth 3 (three) times daily. 05/13/18  Yes Fisher, Roselyn Bering, PA-C  diphenhydramine-acetaminophen (TYLENOL PM) 25-500 MG TABS tablet Take 1 tablet by mouth at bedtime as needed.   Yes [provider]  dorzolamide (TRUSOPT) 2 % ophthalmic solution Place 1 drop into the right eye 2 (two) times daily.    Yes [provider]  insulin aspart (NOVOLOG) 100 UNIT/ML injection Inject 30-50 Units into the skin daily. In pump.  Basil and bolus before each meal    Yes [provider]  insulin glargine (LANTUS) 100 UNIT/ML injection Inject 15 Units into the skin as needed.    Yes [provider]  latanoprost (XALATAN) 0.005 % ophthalmic solution Place 1 drop into the right eye at bedtime.    Yes [provider]  levothyroxine (SYNTHROID, LEVOTHROID) 50 MCG tablet Take 50 mcg by mouth daily before breakfast.   Yes [provider]  lisinopril (PRINIVIL,ZESTRIL) 5 MG tablet Take 5 mg by mouth daily.    Yes [provider]  Omega-3 Fatty Acids (  FISH OIL PO) Take 1 capsule by mouth daily.    Yes [provider]  simvastatin (ZOCOR) 40 MG tablet Take 40 mg by mouth daily.   Yes [provider]  timolol (BETIMOL) 0.5 % ophthalmic solution Place 1 drop into both eyes 2 (two) times daily.   Yes [provider]  traMADol (ULTRAM) 50 MG tablet Take 1 tablet (50 mg total) by mouth every 6 (six) hours as needed. 05/13/18  Yes Fisher, Roselyn BeringSusan W, PA-C  vitamin B-12 (CYANOCOBALAMIN) 1000 MCG tablet Take 1,000 mcg by mouth 2 (two) times daily.    Yes [provider]  furosemide (LASIX) 20 MG tablet  Take 1 tablet (20 mg total) by mouth daily. 06/16/17 07/16/17  Houston SirenSainani, Vivek J, MD    Allergies Patient has no known allergies.  No family history on file.  Social History Social History   Tobacco Use  . Smoking status: Former Smoker    Years: 30.00    Last attempt to quit: 1971    Years since quitting: 49.2  . Smokeless tobacco: Never Used  Substance Use Topics  . Alcohol use: Never    Frequency: Never  . Drug use: Never    Review of Systems  Constitutional: No fever/chills Eyes: No visual changes. ENT: No sore throat. Cardiovascular: Denies chest pain. Respiratory: Denies shortness of breath. Gastrointestinal: No abdominal pain.  No nausea, no vomiting.  No diarrhea.  No constipation. Genitourinary: Negative for dysuria. Musculoskeletal: Negative for back pain. Skin: Negative for rash. Neurological: Negative for headaches, focal weakness  ____________________________________________   PHYSICAL EXAM:  VITAL SIGNS: ED Triage Vitals [05/15/18 0619]  Enc Vitals Group     BP (!) 163/75     Pulse Rate 88     Resp 20     Temp 98.1 F (36.7 C)     Temp Source Oral     SpO2 98 %     Weight 127 lb (57.6 kg)     Height 5\' 6"  (1.676 m)     Head Circumference      Peak Flow      Pain Score 10     Pain Loc      Pain Edu?      Excl. in GC?     Constitutional: Alert and oriented. Well appearing and in no acute distress. Eyes: Conjunctivae are normal. Head: Atraumatic. Nose: No congestion/rhinnorhea. Mouth/Throat: Mucous membranes are moist.  Oropharynx non-erythematous. Neck: No stridor.  Cardiovascular:   Good peripheral circulation. Respiratory: Normal respiratory effort.  No retractions. L. Musculoskeletal: No lower extremity tenderness nor edema.  The left wrist is slightly red and puffy on the dorsum where it slightly tender the palmar surfaces not tender.  Patient reports pain is better since we took the splint off.  He got better range of motion.  Fingers  themselves do not look or smell infected. Neurologic:  Normal speech and language. No gross focal neurologic deficits are appreciated. No gait instability. Skin:  Skin is warm, dry and intact. No rash noted. Psychiatric: Mood and affect are normal. Speech and behavior are normal.  ____________________________________________   LABS (all labs ordered are listed, but only abnormal results are displayed)  Labs Reviewed  BASIC METABOLIC PANEL - Abnormal; Notable for the following components:      Result Value   Glucose, Bld 188 (*)    All other components within normal limits  CBC WITH DIFFERENTIAL/PLATELET  CBC WITH DIFFERENTIAL/PLATELET  SEDIMENTATION RATE   ____________________________________________  EKG  ____________________________________________  RADIOLOGY  ED MD interpretation: A read by radiology reviewed by me shows DJD.  Official radiology report(s): Dg Wrist Complete Left  Result Date: 05/15/2018 CLINICAL DATA:  Wrist pain and swelling. EXAM: LEFT WRIST - COMPLETE 3+ VIEW COMPARISON:  Recent hand injury. FINDINGS: Advanced degenerative changes involving the wrist with a SLAC wrist pattern. Chondrocalcinosis is noted along with vascular calcifications. No acute fracture. 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 Electronically Signed   By: Rudie Meyer M.D.   On: 05/15/2018 08:48    ____________________________________________   PROCEDURES  Procedure(s) performed (including Critical Care):  Procedures   ____________________________________________   INITIAL IMPRESSION / ASSESSMENT AND PLAN / ED COURSE  Discussed patient with Dr. Odis Luster a CBC and sed rate were sent initially with the BMP but appear to been lost.  We just resent them.  The patient is coming up on his appointment time hopefully by the time he gets his appointment underway the CBC and sed rate will be back  Dr. Odis Luster can check on them I spoke with him and he will do this.  We will put the patient in a finger splint and sent him over now.      ____________________________________________   FINAL CLINICAL IMPRESSION(S) / ED DIAGNOSES  Final diagnoses:  Left wrist pain  Visit for wound check     ED Discharge Orders    None       Note:  This document was prepared using Dragon voice recognition software and may include unintentional dictation errors.    Arnaldo Natal, MD 05/15/18 1012

## 2018-05-15 NOTE — ED Notes (Signed)
Pt's dressings removed from left hand per MD verbal order. Fingers cleaned with sterile water

## 2018-05-17 DIAGNOSIS — J9601 Acute respiratory failure with hypoxia: Secondary | ICD-10-CM | POA: Diagnosis not present

## 2018-05-17 DIAGNOSIS — R739 Hyperglycemia, unspecified: Secondary | ICD-10-CM | POA: Diagnosis not present

## 2018-05-17 DIAGNOSIS — I442 Atrioventricular block, complete: Secondary | ICD-10-CM | POA: Diagnosis not present

## 2018-05-20 DIAGNOSIS — E109 Type 1 diabetes mellitus without complications: Secondary | ICD-10-CM | POA: Diagnosis not present

## 2018-05-21 DIAGNOSIS — S62665B Nondisplaced fracture of distal phalanx of left ring finger, initial encounter for open fracture: Secondary | ICD-10-CM | POA: Diagnosis not present

## 2018-05-21 DIAGNOSIS — M25532 Pain in left wrist: Secondary | ICD-10-CM | POA: Diagnosis not present

## 2018-05-21 DIAGNOSIS — S62661B Nondisplaced fracture of distal phalanx of left index finger, initial encounter for open fracture: Secondary | ICD-10-CM | POA: Diagnosis not present

## 2018-05-21 DIAGNOSIS — S62663B Nondisplaced fracture of distal phalanx of left middle finger, initial encounter for open fracture: Secondary | ICD-10-CM | POA: Diagnosis not present

## 2018-05-22 DIAGNOSIS — S62631D Displaced fracture of distal phalanx of left index finger, subsequent encounter for fracture with routine healing: Secondary | ICD-10-CM | POA: Diagnosis not present

## 2018-05-22 DIAGNOSIS — S62633D Displaced fracture of distal phalanx of left middle finger, subsequent encounter for fracture with routine healing: Secondary | ICD-10-CM | POA: Diagnosis not present

## 2018-05-29 DIAGNOSIS — S62631D Displaced fracture of distal phalanx of left index finger, subsequent encounter for fracture with routine healing: Secondary | ICD-10-CM | POA: Diagnosis not present

## 2018-05-29 DIAGNOSIS — S62633D Displaced fracture of distal phalanx of left middle finger, subsequent encounter for fracture with routine healing: Secondary | ICD-10-CM | POA: Diagnosis not present

## 2018-06-10 DIAGNOSIS — E109 Type 1 diabetes mellitus without complications: Secondary | ICD-10-CM | POA: Diagnosis not present

## 2018-06-11 DIAGNOSIS — S62631D Displaced fracture of distal phalanx of left index finger, subsequent encounter for fracture with routine healing: Secondary | ICD-10-CM | POA: Diagnosis not present

## 2018-06-11 DIAGNOSIS — S62633D Displaced fracture of distal phalanx of left middle finger, subsequent encounter for fracture with routine healing: Secondary | ICD-10-CM | POA: Diagnosis not present

## 2018-06-12 DIAGNOSIS — S62631D Displaced fracture of distal phalanx of left index finger, subsequent encounter for fracture with routine healing: Secondary | ICD-10-CM | POA: Diagnosis not present

## 2018-06-12 DIAGNOSIS — S62633D Displaced fracture of distal phalanx of left middle finger, subsequent encounter for fracture with routine healing: Secondary | ICD-10-CM | POA: Diagnosis not present

## 2018-06-17 DIAGNOSIS — I442 Atrioventricular block, complete: Secondary | ICD-10-CM | POA: Diagnosis not present

## 2018-06-17 DIAGNOSIS — J9601 Acute respiratory failure with hypoxia: Secondary | ICD-10-CM | POA: Diagnosis not present

## 2018-06-17 DIAGNOSIS — R739 Hyperglycemia, unspecified: Secondary | ICD-10-CM | POA: Diagnosis not present

## 2018-06-24 DIAGNOSIS — Z85828 Personal history of other malignant neoplasm of skin: Secondary | ICD-10-CM | POA: Diagnosis not present

## 2018-06-24 DIAGNOSIS — L821 Other seborrheic keratosis: Secondary | ICD-10-CM | POA: Diagnosis not present

## 2018-06-24 DIAGNOSIS — L578 Other skin changes due to chronic exposure to nonionizing radiation: Secondary | ICD-10-CM | POA: Diagnosis not present

## 2018-06-24 DIAGNOSIS — D692 Other nonthrombocytopenic purpura: Secondary | ICD-10-CM | POA: Diagnosis not present

## 2018-06-24 DIAGNOSIS — L570 Actinic keratosis: Secondary | ICD-10-CM | POA: Diagnosis not present

## 2018-07-03 DIAGNOSIS — E109 Type 1 diabetes mellitus without complications: Secondary | ICD-10-CM | POA: Diagnosis not present

## 2018-07-08 DIAGNOSIS — L03012 Cellulitis of left finger: Secondary | ICD-10-CM | POA: Diagnosis not present

## 2018-07-10 DIAGNOSIS — E108 Type 1 diabetes mellitus with unspecified complications: Secondary | ICD-10-CM | POA: Diagnosis not present

## 2018-07-22 DIAGNOSIS — L600 Ingrowing nail: Secondary | ICD-10-CM | POA: Diagnosis not present

## 2018-07-22 DIAGNOSIS — L03012 Cellulitis of left finger: Secondary | ICD-10-CM | POA: Diagnosis not present

## 2018-07-28 DIAGNOSIS — E782 Mixed hyperlipidemia: Secondary | ICD-10-CM | POA: Diagnosis not present

## 2018-07-28 DIAGNOSIS — I2510 Atherosclerotic heart disease of native coronary artery without angina pectoris: Secondary | ICD-10-CM | POA: Diagnosis not present

## 2018-07-28 DIAGNOSIS — I10 Essential (primary) hypertension: Secondary | ICD-10-CM | POA: Diagnosis not present

## 2018-07-28 DIAGNOSIS — G4733 Obstructive sleep apnea (adult) (pediatric): Secondary | ICD-10-CM | POA: Diagnosis not present

## 2018-07-28 DIAGNOSIS — E1065 Type 1 diabetes mellitus with hyperglycemia: Secondary | ICD-10-CM | POA: Diagnosis not present

## 2018-07-28 DIAGNOSIS — E1069 Type 1 diabetes mellitus with other specified complication: Secondary | ICD-10-CM | POA: Diagnosis not present

## 2018-07-28 DIAGNOSIS — E034 Atrophy of thyroid (acquired): Secondary | ICD-10-CM | POA: Diagnosis not present

## 2018-07-28 DIAGNOSIS — Z7689 Persons encountering health services in other specified circumstances: Secondary | ICD-10-CM | POA: Diagnosis not present

## 2018-07-28 DIAGNOSIS — Z0000 Encounter for general adult medical examination without abnormal findings: Secondary | ICD-10-CM | POA: Diagnosis not present

## 2018-08-05 DIAGNOSIS — L600 Ingrowing nail: Secondary | ICD-10-CM | POA: Diagnosis not present

## 2018-08-27 DIAGNOSIS — H401131 Primary open-angle glaucoma, bilateral, mild stage: Secondary | ICD-10-CM | POA: Diagnosis not present

## 2018-09-02 DIAGNOSIS — H401131 Primary open-angle glaucoma, bilateral, mild stage: Secondary | ICD-10-CM | POA: Diagnosis not present

## 2018-09-04 DIAGNOSIS — I442 Atrioventricular block, complete: Secondary | ICD-10-CM | POA: Diagnosis not present

## 2018-09-04 DIAGNOSIS — I10 Essential (primary) hypertension: Secondary | ICD-10-CM | POA: Diagnosis not present

## 2018-09-04 DIAGNOSIS — R0602 Shortness of breath: Secondary | ICD-10-CM | POA: Diagnosis not present

## 2018-09-04 DIAGNOSIS — I2510 Atherosclerotic heart disease of native coronary artery without angina pectoris: Secondary | ICD-10-CM | POA: Diagnosis not present

## 2018-09-04 DIAGNOSIS — R42 Dizziness and giddiness: Secondary | ICD-10-CM | POA: Diagnosis not present

## 2018-09-04 DIAGNOSIS — E782 Mixed hyperlipidemia: Secondary | ICD-10-CM | POA: Diagnosis not present

## 2018-09-04 DIAGNOSIS — I495 Sick sinus syndrome: Secondary | ICD-10-CM | POA: Diagnosis not present

## 2018-09-04 DIAGNOSIS — N183 Chronic kidney disease, stage 3 (moderate): Secondary | ICD-10-CM | POA: Diagnosis not present

## 2018-09-04 DIAGNOSIS — R001 Bradycardia, unspecified: Secondary | ICD-10-CM | POA: Diagnosis not present

## 2018-09-16 DIAGNOSIS — L03012 Cellulitis of left finger: Secondary | ICD-10-CM | POA: Diagnosis not present

## 2018-09-16 DIAGNOSIS — Z01818 Encounter for other preprocedural examination: Secondary | ICD-10-CM | POA: Diagnosis not present

## 2018-09-16 DIAGNOSIS — L600 Ingrowing nail: Secondary | ICD-10-CM | POA: Diagnosis not present

## 2018-09-17 DIAGNOSIS — Z7982 Long term (current) use of aspirin: Secondary | ICD-10-CM | POA: Diagnosis not present

## 2018-09-17 DIAGNOSIS — L600 Ingrowing nail: Secondary | ICD-10-CM | POA: Diagnosis not present

## 2018-09-17 DIAGNOSIS — L03012 Cellulitis of left finger: Secondary | ICD-10-CM | POA: Diagnosis not present

## 2018-09-17 DIAGNOSIS — E079 Disorder of thyroid, unspecified: Secondary | ICD-10-CM | POA: Diagnosis not present

## 2018-09-17 DIAGNOSIS — E119 Type 2 diabetes mellitus without complications: Secondary | ICD-10-CM | POA: Diagnosis not present

## 2018-09-17 DIAGNOSIS — I519 Heart disease, unspecified: Secondary | ICD-10-CM | POA: Diagnosis not present

## 2018-09-17 DIAGNOSIS — Z794 Long term (current) use of insulin: Secondary | ICD-10-CM | POA: Diagnosis not present

## 2018-09-17 DIAGNOSIS — Z79899 Other long term (current) drug therapy: Secondary | ICD-10-CM | POA: Diagnosis not present

## 2018-09-17 DIAGNOSIS — G4730 Sleep apnea, unspecified: Secondary | ICD-10-CM | POA: Diagnosis not present

## 2018-09-22 DIAGNOSIS — R0602 Shortness of breath: Secondary | ICD-10-CM | POA: Diagnosis not present

## 2018-09-22 DIAGNOSIS — I495 Sick sinus syndrome: Secondary | ICD-10-CM | POA: Diagnosis not present

## 2018-10-02 DIAGNOSIS — E109 Type 1 diabetes mellitus without complications: Secondary | ICD-10-CM | POA: Diagnosis not present

## 2018-10-09 DIAGNOSIS — E108 Type 1 diabetes mellitus with unspecified complications: Secondary | ICD-10-CM | POA: Diagnosis not present

## 2018-10-09 DIAGNOSIS — E10649 Type 1 diabetes mellitus with hypoglycemia without coma: Secondary | ICD-10-CM | POA: Diagnosis not present

## 2018-10-09 DIAGNOSIS — Z9641 Presence of insulin pump (external) (internal): Secondary | ICD-10-CM | POA: Diagnosis not present

## 2018-10-09 DIAGNOSIS — M810 Age-related osteoporosis without current pathological fracture: Secondary | ICD-10-CM | POA: Diagnosis not present

## 2018-10-09 DIAGNOSIS — E034 Atrophy of thyroid (acquired): Secondary | ICD-10-CM | POA: Diagnosis not present

## 2018-10-20 DIAGNOSIS — M810 Age-related osteoporosis without current pathological fracture: Secondary | ICD-10-CM | POA: Diagnosis not present

## 2018-10-20 DIAGNOSIS — Z7189 Other specified counseling: Secondary | ICD-10-CM | POA: Diagnosis not present

## 2018-10-20 DIAGNOSIS — E109 Type 1 diabetes mellitus without complications: Secondary | ICD-10-CM | POA: Diagnosis not present

## 2018-10-20 DIAGNOSIS — E034 Atrophy of thyroid (acquired): Secondary | ICD-10-CM | POA: Diagnosis not present

## 2018-11-25 DIAGNOSIS — I2510 Atherosclerotic heart disease of native coronary artery without angina pectoris: Secondary | ICD-10-CM | POA: Diagnosis not present

## 2018-11-25 DIAGNOSIS — I10 Essential (primary) hypertension: Secondary | ICD-10-CM | POA: Diagnosis not present

## 2018-11-25 DIAGNOSIS — E1065 Type 1 diabetes mellitus with hyperglycemia: Secondary | ICD-10-CM | POA: Diagnosis not present

## 2018-11-25 DIAGNOSIS — E034 Atrophy of thyroid (acquired): Secondary | ICD-10-CM | POA: Diagnosis not present

## 2018-11-25 DIAGNOSIS — Z0000 Encounter for general adult medical examination without abnormal findings: Secondary | ICD-10-CM | POA: Diagnosis not present

## 2018-11-25 DIAGNOSIS — E1069 Type 1 diabetes mellitus with other specified complication: Secondary | ICD-10-CM | POA: Diagnosis not present

## 2018-11-25 DIAGNOSIS — E1165 Type 2 diabetes mellitus with hyperglycemia: Secondary | ICD-10-CM | POA: Diagnosis not present

## 2018-11-25 DIAGNOSIS — G4733 Obstructive sleep apnea (adult) (pediatric): Secondary | ICD-10-CM | POA: Diagnosis not present

## 2018-11-25 DIAGNOSIS — Z7689 Persons encountering health services in other specified circumstances: Secondary | ICD-10-CM | POA: Diagnosis not present

## 2018-11-25 DIAGNOSIS — Z125 Encounter for screening for malignant neoplasm of prostate: Secondary | ICD-10-CM | POA: Diagnosis not present

## 2018-11-25 DIAGNOSIS — E782 Mixed hyperlipidemia: Secondary | ICD-10-CM | POA: Diagnosis not present

## 2018-11-28 DIAGNOSIS — E109 Type 1 diabetes mellitus without complications: Secondary | ICD-10-CM | POA: Diagnosis not present

## 2018-12-02 ENCOUNTER — Other Ambulatory Visit: Payer: Self-pay | Admitting: Internal Medicine

## 2018-12-02 ENCOUNTER — Other Ambulatory Visit (HOSPITAL_COMMUNITY): Payer: Self-pay | Admitting: Internal Medicine

## 2018-12-02 DIAGNOSIS — E039 Hypothyroidism, unspecified: Secondary | ICD-10-CM | POA: Diagnosis not present

## 2018-12-02 DIAGNOSIS — N289 Disorder of kidney and ureter, unspecified: Secondary | ICD-10-CM | POA: Diagnosis not present

## 2018-12-02 DIAGNOSIS — I2510 Atherosclerotic heart disease of native coronary artery without angina pectoris: Secondary | ICD-10-CM | POA: Diagnosis not present

## 2018-12-02 DIAGNOSIS — M8580 Other specified disorders of bone density and structure, unspecified site: Secondary | ICD-10-CM | POA: Diagnosis not present

## 2018-12-02 DIAGNOSIS — E785 Hyperlipidemia, unspecified: Secondary | ICD-10-CM | POA: Diagnosis not present

## 2018-12-02 DIAGNOSIS — R197 Diarrhea, unspecified: Secondary | ICD-10-CM | POA: Diagnosis not present

## 2018-12-02 DIAGNOSIS — Z79899 Other long term (current) drug therapy: Secondary | ICD-10-CM | POA: Diagnosis not present

## 2018-12-02 DIAGNOSIS — H409 Unspecified glaucoma: Secondary | ICD-10-CM | POA: Diagnosis not present

## 2018-12-02 DIAGNOSIS — E119 Type 2 diabetes mellitus without complications: Secondary | ICD-10-CM | POA: Diagnosis not present

## 2018-12-04 DIAGNOSIS — H401131 Primary open-angle glaucoma, bilateral, mild stage: Secondary | ICD-10-CM | POA: Diagnosis not present

## 2018-12-04 DIAGNOSIS — R197 Diarrhea, unspecified: Secondary | ICD-10-CM | POA: Diagnosis not present

## 2018-12-08 ENCOUNTER — Other Ambulatory Visit: Payer: Self-pay | Admitting: Internal Medicine

## 2018-12-08 DIAGNOSIS — R112 Nausea with vomiting, unspecified: Secondary | ICD-10-CM

## 2018-12-08 DIAGNOSIS — R197 Diarrhea, unspecified: Secondary | ICD-10-CM

## 2018-12-16 DIAGNOSIS — I442 Atrioventricular block, complete: Secondary | ICD-10-CM | POA: Diagnosis not present

## 2018-12-17 ENCOUNTER — Other Ambulatory Visit: Payer: Self-pay

## 2018-12-17 ENCOUNTER — Ambulatory Visit
Admission: RE | Admit: 2018-12-17 | Discharge: 2018-12-17 | Disposition: A | Discharge status: Home / Self Care | Payer: Medicare HMO | Source: Ambulatory Visit | Attending: Internal Medicine | Admitting: Internal Medicine

## 2018-12-17 DIAGNOSIS — I700 Atherosclerosis of aorta: Secondary | ICD-10-CM | POA: Diagnosis not present

## 2018-12-17 DIAGNOSIS — K449 Diaphragmatic hernia without obstruction or gangrene: Secondary | ICD-10-CM | POA: Insufficient documentation

## 2018-12-17 DIAGNOSIS — R197 Diarrhea, unspecified: Secondary | ICD-10-CM | POA: Insufficient documentation

## 2018-12-17 DIAGNOSIS — R112 Nausea with vomiting, unspecified: Secondary | ICD-10-CM

## 2018-12-17 MED ORDER — IOHEXOL 300 MG/ML  SOLN
100.0000 mL | Freq: Once | INTRAMUSCULAR | Status: AC | PRN
  Administered 2018-12-17: 60 mL via INTRAVENOUS

## 2018-12-24 DIAGNOSIS — L578 Other skin changes due to chronic exposure to nonionizing radiation: Secondary | ICD-10-CM | POA: Diagnosis not present

## 2018-12-24 DIAGNOSIS — L570 Actinic keratosis: Secondary | ICD-10-CM | POA: Diagnosis not present

## 2018-12-24 DIAGNOSIS — L820 Inflamed seborrheic keratosis: Secondary | ICD-10-CM | POA: Diagnosis not present

## 2018-12-24 DIAGNOSIS — L821 Other seborrheic keratosis: Secondary | ICD-10-CM | POA: Diagnosis not present

## 2019-01-06 DIAGNOSIS — E109 Type 1 diabetes mellitus without complications: Secondary | ICD-10-CM | POA: Diagnosis not present

## 2019-01-19 DIAGNOSIS — Z9641 Presence of insulin pump (external) (internal): Secondary | ICD-10-CM | POA: Diagnosis not present

## 2019-01-19 DIAGNOSIS — E034 Atrophy of thyroid (acquired): Secondary | ICD-10-CM | POA: Diagnosis not present

## 2019-01-19 DIAGNOSIS — M810 Age-related osteoporosis without current pathological fracture: Secondary | ICD-10-CM | POA: Diagnosis not present

## 2019-01-19 DIAGNOSIS — E10649 Type 1 diabetes mellitus with hypoglycemia without coma: Secondary | ICD-10-CM | POA: Diagnosis not present

## 2019-02-04 DIAGNOSIS — Z9641 Presence of insulin pump (external) (internal): Secondary | ICD-10-CM | POA: Diagnosis not present

## 2019-02-04 DIAGNOSIS — E10649 Type 1 diabetes mellitus with hypoglycemia without coma: Secondary | ICD-10-CM | POA: Diagnosis not present

## 2019-02-17 DIAGNOSIS — E109 Type 1 diabetes mellitus without complications: Secondary | ICD-10-CM | POA: Diagnosis not present

## 2019-02-23 DIAGNOSIS — E782 Mixed hyperlipidemia: Secondary | ICD-10-CM | POA: Diagnosis not present

## 2019-02-23 DIAGNOSIS — I2510 Atherosclerotic heart disease of native coronary artery without angina pectoris: Secondary | ICD-10-CM | POA: Diagnosis not present

## 2019-02-23 DIAGNOSIS — R001 Bradycardia, unspecified: Secondary | ICD-10-CM | POA: Diagnosis not present

## 2019-02-23 DIAGNOSIS — N183 Chronic kidney disease, stage 3 unspecified: Secondary | ICD-10-CM | POA: Diagnosis not present

## 2019-02-23 DIAGNOSIS — I495 Sick sinus syndrome: Secondary | ICD-10-CM | POA: Diagnosis not present

## 2019-02-23 DIAGNOSIS — I442 Atrioventricular block, complete: Secondary | ICD-10-CM | POA: Diagnosis not present

## 2019-02-23 DIAGNOSIS — R0602 Shortness of breath: Secondary | ICD-10-CM | POA: Diagnosis not present

## 2019-02-23 DIAGNOSIS — R42 Dizziness and giddiness: Secondary | ICD-10-CM | POA: Diagnosis not present

## 2019-02-23 DIAGNOSIS — I10 Essential (primary) hypertension: Secondary | ICD-10-CM | POA: Diagnosis not present

## 2019-03-03 DIAGNOSIS — E10649 Type 1 diabetes mellitus with hypoglycemia without coma: Secondary | ICD-10-CM | POA: Diagnosis not present

## 2019-03-03 DIAGNOSIS — Z9641 Presence of insulin pump (external) (internal): Secondary | ICD-10-CM | POA: Diagnosis not present

## 2019-03-03 DIAGNOSIS — E034 Atrophy of thyroid (acquired): Secondary | ICD-10-CM | POA: Diagnosis not present

## 2019-03-19 IMAGING — DX DG HAND COMPLETE 3+V*L*
3 series · 3 of 3 positions shown · non-contrast
Comparison: None.

CLINICAL DATA: Cut hand on a table saw.

EXAM:
LEFT HAND - COMPLETE 3+ VIEW

[hand ap]
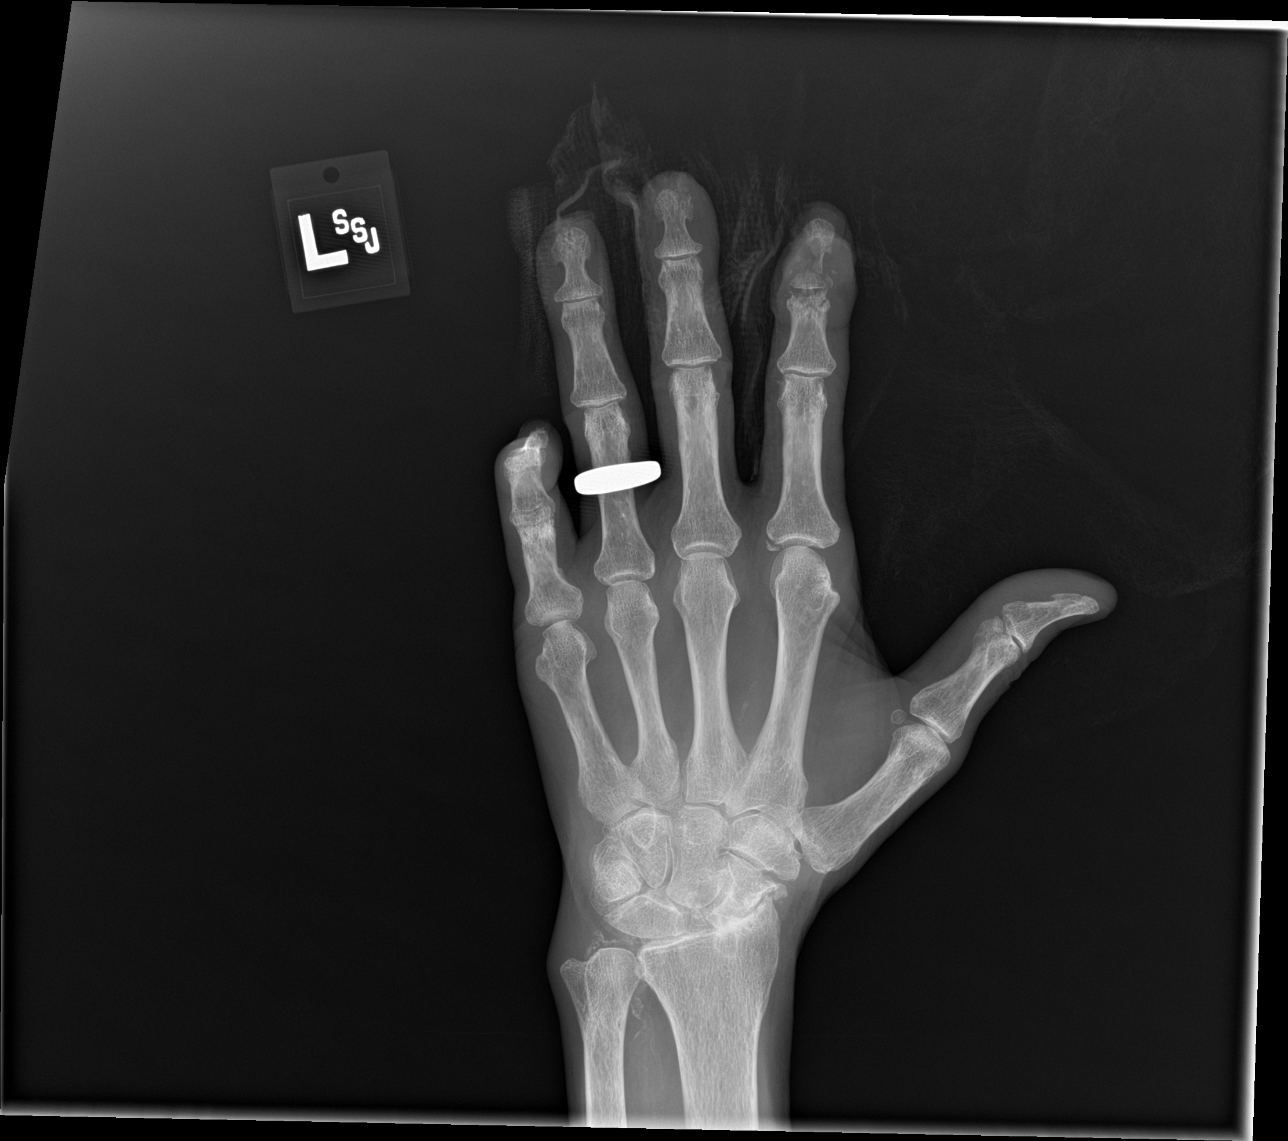

[hand obl]
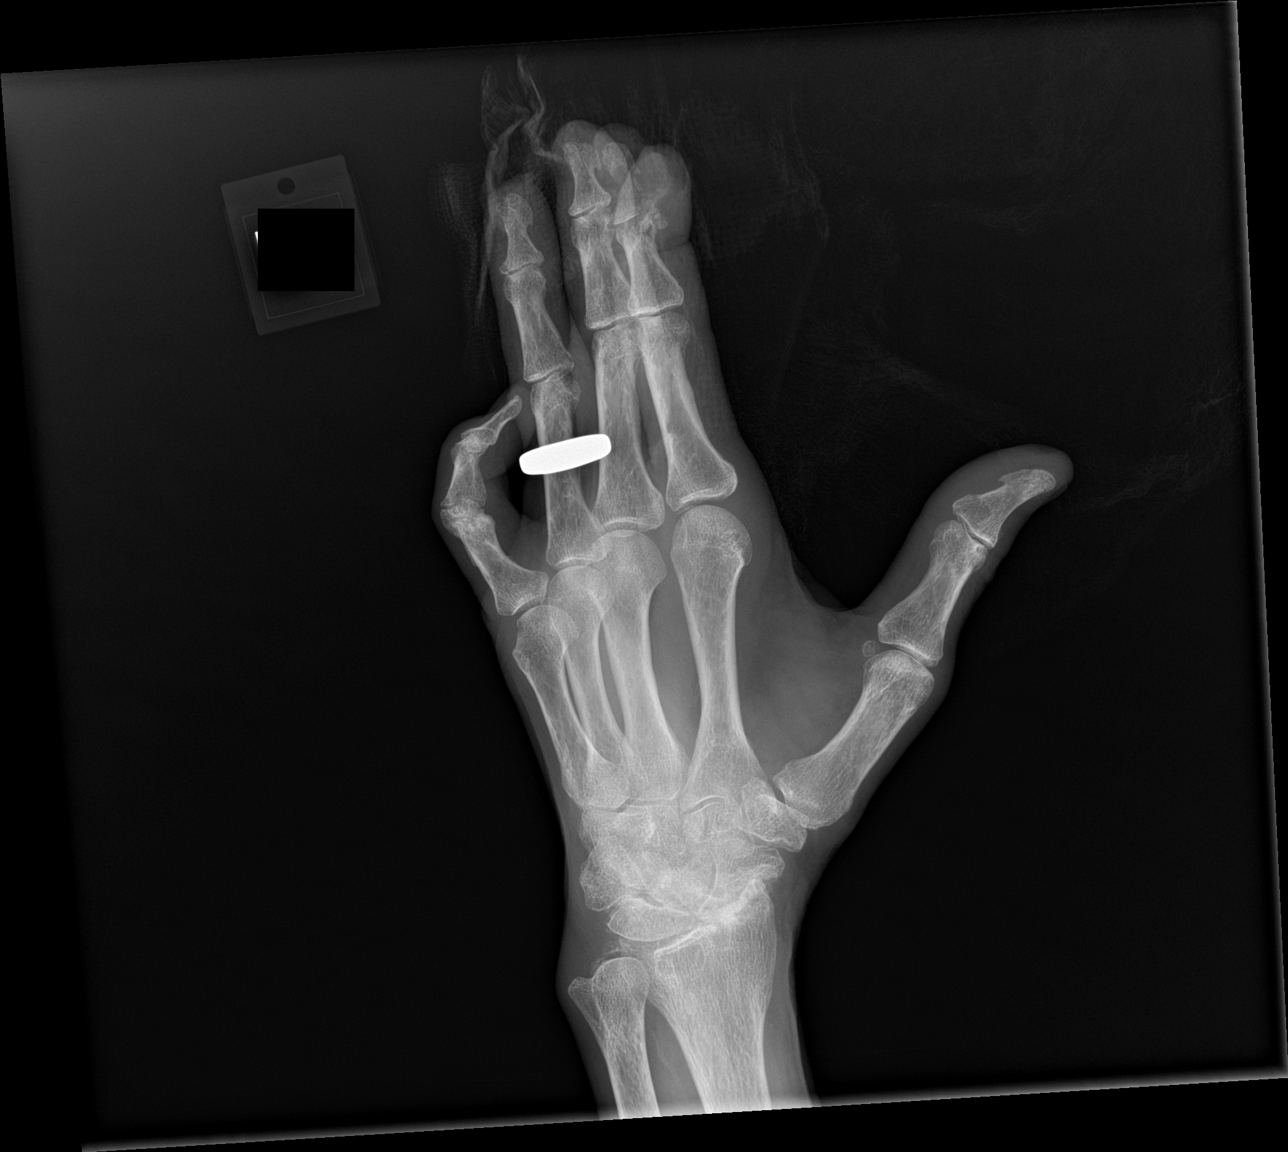

[hand lat]
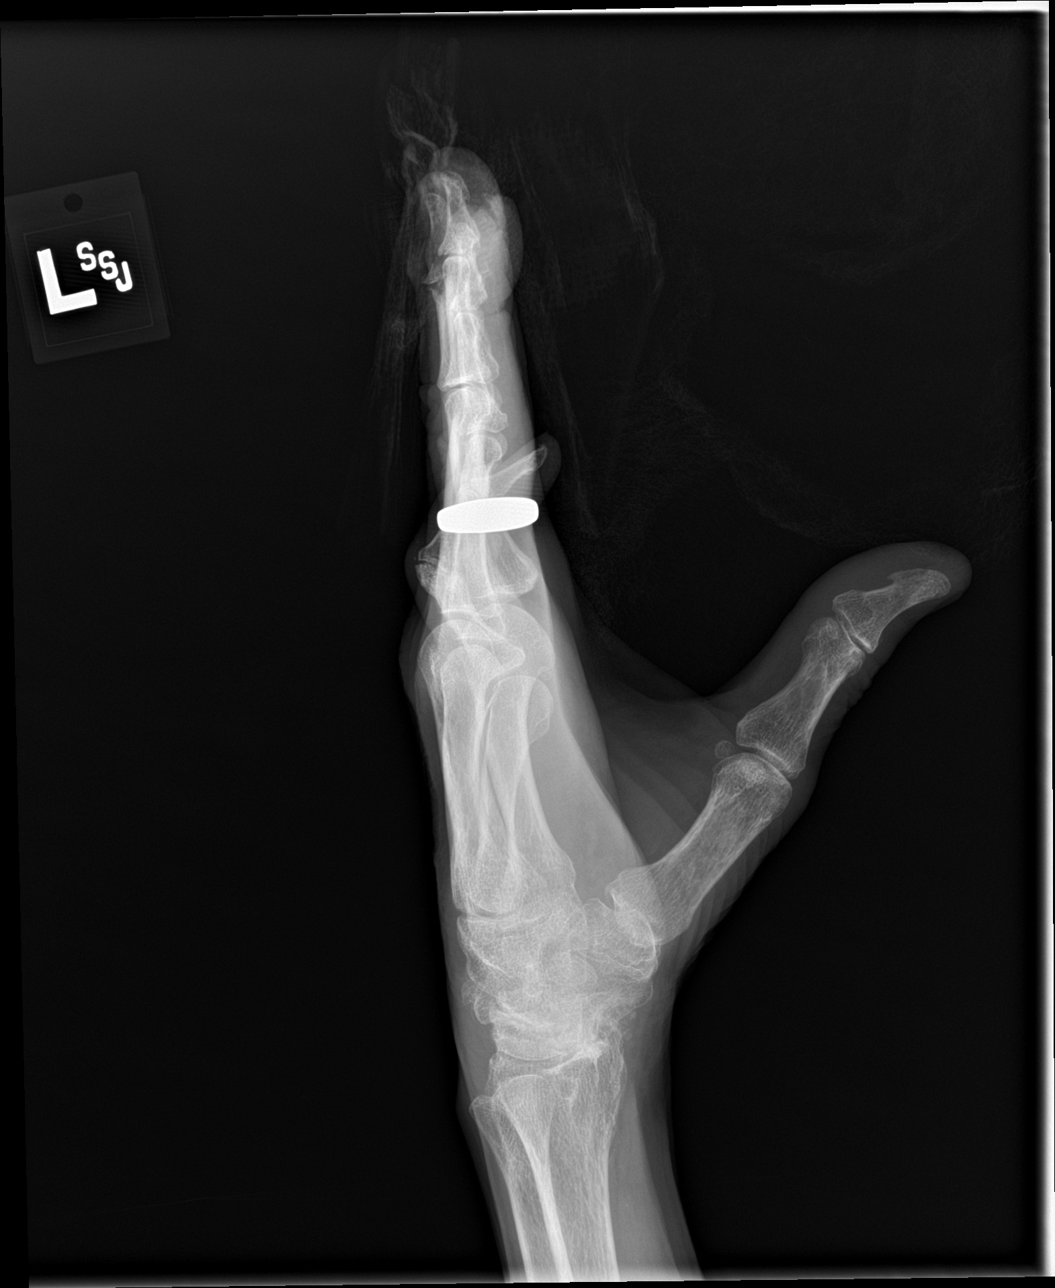

[3 of 3 positions shown; findings below may reference images not displayed]

FINDINGS: Soft tissue wounds involving the second, third and fourth finger
tips. There is a severely comminuted displaced intra-articular open
fracture involving the distal aspect of the middle phalanx and also
the distal phalanx.

Suspect nondisplaced fractures involving the distal [REDACTED] of the
third and fourth fingers.

Chronic flexion deformities of the PIP and DIP joints of the fifth
finger.

Possible acute avulsion fracture involving the ulnar aspect of the
proximal phalanx at the MCP joint.

Advanced degenerative changes involving the wrist joint with SLAC
wrist pattern. Marked widening of the scapholunate joint space with
settling of the capitate. There is also chondrocalcinosis and
vascular calcifications.
IMPRESSION: 1. Severely comminuted and displaced intra-articular open fractures
of the middle and distal phalanges of the index finger.
2. Suspect nondisplaced distal tuft fractures of the long and ring
fingers.
3. Acute versus chronic avulsion fracture involving the proximal
phalanx of the index finger.
4. Severe degenerative changes involving the wrist with a SLAC wrist
pattern.

## 2019-03-26 DIAGNOSIS — Z9641 Presence of insulin pump (external) (internal): Secondary | ICD-10-CM | POA: Diagnosis not present

## 2019-03-26 DIAGNOSIS — E10649 Type 1 diabetes mellitus with hypoglycemia without coma: Secondary | ICD-10-CM | POA: Diagnosis not present

## 2019-04-01 DIAGNOSIS — I2510 Atherosclerotic heart disease of native coronary artery without angina pectoris: Secondary | ICD-10-CM | POA: Diagnosis not present

## 2019-04-01 DIAGNOSIS — E1065 Type 1 diabetes mellitus with hyperglycemia: Secondary | ICD-10-CM | POA: Diagnosis not present

## 2019-04-01 DIAGNOSIS — E782 Mixed hyperlipidemia: Secondary | ICD-10-CM | POA: Diagnosis not present

## 2019-04-01 DIAGNOSIS — G4733 Obstructive sleep apnea (adult) (pediatric): Secondary | ICD-10-CM | POA: Diagnosis not present

## 2019-04-01 DIAGNOSIS — Z9641 Presence of insulin pump (external) (internal): Secondary | ICD-10-CM | POA: Diagnosis not present

## 2019-04-01 DIAGNOSIS — R197 Diarrhea, unspecified: Secondary | ICD-10-CM | POA: Diagnosis not present

## 2019-04-01 DIAGNOSIS — N289 Disorder of kidney and ureter, unspecified: Secondary | ICD-10-CM | POA: Diagnosis not present

## 2019-04-01 DIAGNOSIS — D649 Anemia, unspecified: Secondary | ICD-10-CM | POA: Diagnosis not present

## 2019-04-01 DIAGNOSIS — Z950 Presence of cardiac pacemaker: Secondary | ICD-10-CM | POA: Diagnosis not present

## 2019-04-06 DIAGNOSIS — H401131 Primary open-angle glaucoma, bilateral, mild stage: Secondary | ICD-10-CM | POA: Diagnosis not present

## 2019-04-08 DIAGNOSIS — H409 Unspecified glaucoma: Secondary | ICD-10-CM | POA: Diagnosis not present

## 2019-04-08 DIAGNOSIS — I129 Hypertensive chronic kidney disease with stage 1 through stage 4 chronic kidney disease, or unspecified chronic kidney disease: Secondary | ICD-10-CM | POA: Diagnosis not present

## 2019-04-08 DIAGNOSIS — D649 Anemia, unspecified: Secondary | ICD-10-CM | POA: Diagnosis not present

## 2019-04-08 DIAGNOSIS — Z87891 Personal history of nicotine dependence: Secondary | ICD-10-CM | POA: Diagnosis not present

## 2019-04-08 DIAGNOSIS — E109 Type 1 diabetes mellitus without complications: Secondary | ICD-10-CM | POA: Diagnosis not present

## 2019-04-08 DIAGNOSIS — Z950 Presence of cardiac pacemaker: Secondary | ICD-10-CM | POA: Diagnosis not present

## 2019-04-08 DIAGNOSIS — N183 Chronic kidney disease, stage 3 unspecified: Secondary | ICD-10-CM | POA: Diagnosis not present

## 2019-04-08 DIAGNOSIS — E039 Hypothyroidism, unspecified: Secondary | ICD-10-CM | POA: Diagnosis not present

## 2019-04-08 DIAGNOSIS — Z0000 Encounter for general adult medical examination without abnormal findings: Secondary | ICD-10-CM | POA: Diagnosis not present

## 2019-04-08 DIAGNOSIS — R5383 Other fatigue: Secondary | ICD-10-CM | POA: Diagnosis not present

## 2019-04-13 DIAGNOSIS — L570 Actinic keratosis: Secondary | ICD-10-CM | POA: Diagnosis not present

## 2019-04-13 DIAGNOSIS — L820 Inflamed seborrheic keratosis: Secondary | ICD-10-CM | POA: Diagnosis not present

## 2019-04-21 DIAGNOSIS — E034 Atrophy of thyroid (acquired): Secondary | ICD-10-CM | POA: Diagnosis not present

## 2019-04-21 DIAGNOSIS — E10649 Type 1 diabetes mellitus with hypoglycemia without coma: Secondary | ICD-10-CM | POA: Diagnosis not present

## 2019-04-21 DIAGNOSIS — M810 Age-related osteoporosis without current pathological fracture: Secondary | ICD-10-CM | POA: Diagnosis not present

## 2019-04-21 DIAGNOSIS — Z9641 Presence of insulin pump (external) (internal): Secondary | ICD-10-CM | POA: Diagnosis not present

## 2019-05-06 DIAGNOSIS — I442 Atrioventricular block, complete: Secondary | ICD-10-CM | POA: Diagnosis not present

## 2019-05-12 DIAGNOSIS — F3289 Other specified depressive episodes: Secondary | ICD-10-CM | POA: Diagnosis not present

## 2019-05-12 DIAGNOSIS — Z79899 Other long term (current) drug therapy: Secondary | ICD-10-CM | POA: Diagnosis not present

## 2019-05-12 DIAGNOSIS — L570 Actinic keratosis: Secondary | ICD-10-CM | POA: Diagnosis not present

## 2019-05-12 DIAGNOSIS — E10649 Type 1 diabetes mellitus with hypoglycemia without coma: Secondary | ICD-10-CM | POA: Diagnosis not present

## 2019-05-12 DIAGNOSIS — Z9641 Presence of insulin pump (external) (internal): Secondary | ICD-10-CM | POA: Diagnosis not present

## 2019-05-12 DIAGNOSIS — E034 Atrophy of thyroid (acquired): Secondary | ICD-10-CM | POA: Diagnosis not present

## 2019-05-12 DIAGNOSIS — F329 Major depressive disorder, single episode, unspecified: Secondary | ICD-10-CM | POA: Diagnosis not present

## 2019-05-12 DIAGNOSIS — M810 Age-related osteoporosis without current pathological fracture: Secondary | ICD-10-CM | POA: Diagnosis not present

## 2019-05-21 DIAGNOSIS — E109 Type 1 diabetes mellitus without complications: Secondary | ICD-10-CM | POA: Diagnosis not present

## 2019-06-19 ENCOUNTER — Encounter: Payer: Self-pay | Admitting: Emergency Medicine

## 2019-06-19 ENCOUNTER — Emergency Department: Payer: Medicare HMO

## 2019-06-19 ENCOUNTER — Observation Stay
Admission: EM | Admit: 2019-06-19 | Discharge: 2019-06-20 | Disposition: A | Discharge status: Home / Self Care | Payer: Medicare HMO | Attending: Internal Medicine | Admitting: Internal Medicine

## 2019-06-19 ENCOUNTER — Other Ambulatory Visit: Payer: Self-pay

## 2019-06-19 DIAGNOSIS — Z794 Long term (current) use of insulin: Secondary | ICD-10-CM | POA: Insufficient documentation

## 2019-06-19 DIAGNOSIS — E1122 Type 2 diabetes mellitus with diabetic chronic kidney disease: Secondary | ICD-10-CM | POA: Diagnosis not present

## 2019-06-19 DIAGNOSIS — R7989 Other specified abnormal findings of blood chemistry: Secondary | ICD-10-CM | POA: Diagnosis not present

## 2019-06-19 DIAGNOSIS — M6281 Muscle weakness (generalized): Secondary | ICD-10-CM | POA: Insufficient documentation

## 2019-06-19 DIAGNOSIS — G4730 Sleep apnea, unspecified: Secondary | ICD-10-CM | POA: Insufficient documentation

## 2019-06-19 DIAGNOSIS — E039 Hypothyroidism, unspecified: Secondary | ICD-10-CM | POA: Diagnosis not present

## 2019-06-19 DIAGNOSIS — N189 Chronic kidney disease, unspecified: Secondary | ICD-10-CM | POA: Diagnosis not present

## 2019-06-19 DIAGNOSIS — Z20822 Contact with and (suspected) exposure to covid-19: Secondary | ICD-10-CM | POA: Diagnosis not present

## 2019-06-19 DIAGNOSIS — R0600 Dyspnea, unspecified: Secondary | ICD-10-CM | POA: Diagnosis not present

## 2019-06-19 DIAGNOSIS — Z87891 Personal history of nicotine dependence: Secondary | ICD-10-CM | POA: Diagnosis not present

## 2019-06-19 DIAGNOSIS — I129 Hypertensive chronic kidney disease with stage 1 through stage 4 chronic kidney disease, or unspecified chronic kidney disease: Secondary | ICD-10-CM | POA: Insufficient documentation

## 2019-06-19 DIAGNOSIS — Z79899 Other long term (current) drug therapy: Secondary | ICD-10-CM | POA: Insufficient documentation

## 2019-06-19 DIAGNOSIS — Z9841 Cataract extraction status, right eye: Secondary | ICD-10-CM | POA: Insufficient documentation

## 2019-06-19 DIAGNOSIS — Z7982 Long term (current) use of aspirin: Secondary | ICD-10-CM | POA: Diagnosis not present

## 2019-06-19 DIAGNOSIS — I10 Essential (primary) hypertension: Secondary | ICD-10-CM | POA: Diagnosis not present

## 2019-06-19 DIAGNOSIS — E1139 Type 2 diabetes mellitus with other diabetic ophthalmic complication: Secondary | ICD-10-CM | POA: Diagnosis not present

## 2019-06-19 DIAGNOSIS — Z7901 Long term (current) use of anticoagulants: Secondary | ICD-10-CM | POA: Insufficient documentation

## 2019-06-19 DIAGNOSIS — M810 Age-related osteoporosis without current pathological fracture: Secondary | ICD-10-CM | POA: Insufficient documentation

## 2019-06-19 DIAGNOSIS — R531 Weakness: Principal | ICD-10-CM

## 2019-06-19 DIAGNOSIS — I2510 Atherosclerotic heart disease of native coronary artery without angina pectoris: Secondary | ICD-10-CM | POA: Insufficient documentation

## 2019-06-19 DIAGNOSIS — H42 Glaucoma in diseases classified elsewhere: Secondary | ICD-10-CM | POA: Insufficient documentation

## 2019-06-19 DIAGNOSIS — N179 Acute kidney failure, unspecified: Secondary | ICD-10-CM | POA: Diagnosis not present

## 2019-06-19 DIAGNOSIS — Z950 Presence of cardiac pacemaker: Secondary | ICD-10-CM | POA: Insufficient documentation

## 2019-06-19 DIAGNOSIS — I499 Cardiac arrhythmia, unspecified: Secondary | ICD-10-CM | POA: Diagnosis not present

## 2019-06-19 DIAGNOSIS — I080 Rheumatic disorders of both mitral and aortic valves: Secondary | ICD-10-CM | POA: Insufficient documentation

## 2019-06-19 DIAGNOSIS — Z961 Presence of intraocular lens: Secondary | ICD-10-CM | POA: Insufficient documentation

## 2019-06-19 DIAGNOSIS — R0602 Shortness of breath: Secondary | ICD-10-CM | POA: Diagnosis not present

## 2019-06-19 DIAGNOSIS — R0609 Other forms of dyspnea: Secondary | ICD-10-CM | POA: Diagnosis not present

## 2019-06-19 DIAGNOSIS — R778 Other specified abnormalities of plasma proteins: Secondary | ICD-10-CM | POA: Diagnosis not present

## 2019-06-19 LAB — BASIC METABOLIC PANEL
Anion gap: 8 (ref 5–15)
BUN: 23 mg/dL (ref 8–23)
CO2: 23 mmol/L (ref 22–32)
Calcium: 9.1 mg/dL (ref 8.9–10.3)
Chloride: 108 mmol/L (ref 98–111)
Creatinine, Ser: 1.35 mg/dL — ABNORMAL HIGH (ref 0.61–1.24)
GFR calc Af Amer: 55 mL/min — ABNORMAL LOW (ref >60)
GFR calc non Af Amer: 48 mL/min — ABNORMAL LOW (ref >60)
Glucose, Bld: 112 mg/dL — ABNORMAL HIGH (ref 70–99)
Potassium: 3.8 mmol/L (ref 3.5–5.1)
Sodium: 139 mmol/L (ref 135–145)

## 2019-06-19 LAB — CBC WITH DIFFERENTIAL/PLATELET
Abs Immature Granulocytes: 0.02 10*3/uL (ref 0.00–0.07)
Basophils Absolute: 0.1 10*3/uL (ref 0.0–0.1)
Basophils Relative: 1 %
Eosinophils Absolute: 0.3 10*3/uL (ref 0.0–0.5)
Eosinophils Relative: 4 %
HCT: 36.1 % — ABNORMAL LOW (ref 39.0–52.0)
Hemoglobin: 11.8 g/dL — ABNORMAL LOW (ref 13.0–17.0)
Immature Granulocytes: 0 %
Lymphocytes Relative: 29 %
Lymphs Abs: 2 10*3/uL (ref 0.7–4.0)
MCH: 29.5 pg (ref 26.0–34.0)
MCHC: 32.7 g/dL (ref 30.0–36.0)
MCV: 90.3 fL (ref 80.0–100.0)
Monocytes Absolute: 0.6 10*3/uL (ref 0.1–1.0)
Monocytes Relative: 8 %
Neutro Abs: 4.1 10*3/uL (ref 1.7–7.7)
Neutrophils Relative %: 58 %
Platelets: 196 10*3/uL (ref 150–400)
RBC: 4 MIL/uL — ABNORMAL LOW (ref 4.22–5.81)
RDW: 13.5 % (ref 11.5–15.5)
WBC: 7 10*3/uL (ref 4.0–10.5)
nRBC: 0 % (ref 0.0–0.2)

## 2019-06-19 LAB — HEPATIC FUNCTION PANEL
ALT: 16 U/L (ref 0–44)
AST: 20 U/L (ref 15–41)
Albumin: 3.8 g/dL (ref 3.5–5.0)
Alkaline Phosphatase: 63 U/L (ref 38–126)
Bilirubin, Direct: <0.1 mg/dL (ref 0.0–0.2)
Total Bilirubin: 0.7 mg/dL (ref 0.3–1.2)
Total Protein: 6.5 g/dL (ref 6.5–8.1)

## 2019-06-19 LAB — BRAIN NATRIURETIC PEPTIDE: B Natriuretic Peptide: 168 pg/mL — ABNORMAL HIGH (ref 0.0–100.0)

## 2019-06-19 LAB — TROPONIN I (HIGH SENSITIVITY): Troponin I (High Sensitivity): 46 ng/L — ABNORMAL HIGH (ref <18)

## 2019-06-19 LAB — TSH: TSH: 2.29 u[IU]/mL (ref 0.350–4.500)

## 2019-06-19 MED ORDER — DORZOLAMIDE HCL 2 % OP SOLN
1.0000 [drp] | Freq: Two times a day (BID) | OPHTHALMIC | Status: DC
  Administered 2019-06-20: 1 [drp] via OPHTHALMIC
  Filled 2019-06-19: qty 10

## 2019-06-19 MED ORDER — ENOXAPARIN SODIUM 40 MG/0.4ML ~~LOC~~ SOLN
40.0000 mg | SUBCUTANEOUS | Status: DC
  Administered 2019-06-19: 22:00:00 40 mg via SUBCUTANEOUS
  Filled 2019-06-19: qty 0.4

## 2019-06-19 MED ORDER — ASPIRIN 81 MG PO CHEW
324.0000 mg | CHEWABLE_TABLET | Freq: Once | ORAL | Status: AC
  Administered 2019-06-19: 18:00:00 324 mg via ORAL
  Filled 2019-06-19: qty 4

## 2019-06-19 MED ORDER — LISINOPRIL 5 MG PO TABS
5.0000 mg | ORAL_TABLET | Freq: Every day | ORAL | Status: DC
  Administered 2019-06-19: 22:00:00 5 mg via ORAL
  Filled 2019-06-19: qty 1

## 2019-06-19 MED ORDER — LEVOTHYROXINE SODIUM 50 MCG PO TABS
75.0000 ug | ORAL_TABLET | Freq: Every day | ORAL | Status: DC
  Administered 2019-06-20: 06:00:00 75 ug via ORAL
  Filled 2019-06-19: qty 1

## 2019-06-19 MED ORDER — BRIMONIDINE TARTRATE 0.2 % OP SOLN
1.0000 [drp] | Freq: Every day | OPHTHALMIC | Status: DC
  Filled 2019-06-19: qty 5

## 2019-06-19 MED ORDER — INSULIN GLARGINE 100 UNIT/ML ~~LOC~~ SOLN
12.0000 [IU] | Freq: Every day | SUBCUTANEOUS | Status: DC
  Filled 2019-06-19: qty 0.12

## 2019-06-19 MED ORDER — POLYETHYLENE GLYCOL 3350 17 G PO PACK: 17.0000 g | PACK | Freq: Every day | ORAL | Status: DC | PRN

## 2019-06-19 MED ORDER — ACETAMINOPHEN 650 MG RE SUPP: 650.0000 mg | Freq: Four times a day (QID) | RECTAL | Status: DC | PRN

## 2019-06-19 MED ORDER — LISINOPRIL 5 MG PO TABS: 5.0000 mg | ORAL_TABLET | Freq: Every day | ORAL | Status: DC

## 2019-06-19 MED ORDER — INSULIN ASPART 100 UNIT/ML ~~LOC~~ SOLN: 0.0000 [IU] | Freq: Every day | SUBCUTANEOUS | Status: DC

## 2019-06-19 MED ORDER — SIMVASTATIN 20 MG PO TABS
40.0000 mg | ORAL_TABLET | Freq: Every day | ORAL | Status: DC
  Administered 2019-06-19: 22:00:00 40 mg via ORAL
  Filled 2019-06-19: qty 2

## 2019-06-19 MED ORDER — TIMOLOL MALEATE 0.5 % OP SOLN
1.0000 [drp] | Freq: Two times a day (BID) | OPHTHALMIC | Status: DC
  Administered 2019-06-20: 1 [drp] via OPHTHALMIC
  Filled 2019-06-19: qty 5

## 2019-06-19 MED ORDER — LATANOPROST 0.005 % OP SOLN
1.0000 [drp] | Freq: Every day | OPHTHALMIC | Status: DC
  Filled 2019-06-19: qty 2.5

## 2019-06-19 MED ORDER — ACETAMINOPHEN 325 MG PO TABS: 650.0000 mg | ORAL_TABLET | Freq: Four times a day (QID) | ORAL | Status: DC | PRN

## 2019-06-19 MED ORDER — VITAMIN B-12 1000 MCG PO TABS
1000.0000 ug | ORAL_TABLET | Freq: Every day | ORAL | Status: DC
  Administered 2019-06-20: 11:00:00 1000 ug via ORAL
  Filled 2019-06-19: qty 1

## 2019-06-19 MED ORDER — ASPIRIN EC 81 MG PO TBEC
81.0000 mg | DELAYED_RELEASE_TABLET | Freq: Every day | ORAL | Status: DC
  Administered 2019-06-19: 81 mg via ORAL
  Filled 2019-06-19: qty 1

## 2019-06-19 MED ORDER — SODIUM CHLORIDE 0.9 % IV SOLN: INTRAVENOUS | Status: AC

## 2019-06-19 MED ORDER — INSULIN ASPART 100 UNIT/ML ~~LOC~~ SOLN: 0.0000 [IU] | Freq: Three times a day (TID) | SUBCUTANEOUS | Status: DC

## 2019-06-19 NOTE — H&P (Addendum)
History and Physical    Logan Strong BSJ:[id number] DOB: 1931/05/06 DOA: 06/19/2019  PCP: Tracie Harrier, MD   Patient coming from: Home  I have personally briefly reviewed patient's old medical records in [place]  Chief Complaint: Generalized weakness and shortness of breath.  HPI: Logan Strong is a 86 y.o. male with medical history significant of hypertension, CAD s/p PCI in 2006, pacemaker, diabetes and hypothyroidism came to ED with complaint of worsening generalized weakness and some questionable shortness of breath for about 1 week. Patient denies any chest pain, palpitations, orthopnea or PND.  No fever or chills.  No upper respiratory symptoms.  No nausea or vomiting.  No recent change in his bowel habits or appetite.  No urinary symptoms.  No sick contacts. He lives with his wife who has dementia and is her primary caretaker.  ED Course: On arrival he was hemodynamically stable, labs with BNP of 168 and troponin of 46, creatinine of 1.35, chest x-ray unremarkable, EKG without any acute change. Apparently met ED criteria for admission and hospitalist service was called.  Review of Systems: As per HPI otherwise 10 point review of systems negative.   Past Medical History:  Diagnosis Date  . Anemia   . Chronic kidney disease    "functioning at 50%"  . Coronary artery disease   . Diabetes mellitus, type 2 ([place])   . Dysrhythmia   . Glaucoma   . Hypothyroidism   . Osteoporosis   . Pneumonia   . Sleep apnea    could not tolerate CPAP  . Wears dentures    partial upper and lower    Past Surgical History:  Procedure Laterality Date  . APPENDECTOMY    . BACK SURGERY     lumbar  . CARDIAC CATHETERIZATION  2006   stent x1  . CATARACT EXTRACTION W/PHACO Right 12/19/2016   Procedure: CATARACT EXTRACTION PHACO AND INTRAOCULAR LENS PLACEMENT ([place]) RIGHT DIABETIC;  Surgeon: Leandrew Koyanagi, MD;  Location: [facility];  Service: Ophthalmology;   Laterality: Right;  . HERNIA REPAIR    . PACEMAKER INSERTION N/A 06/13/2017   Procedure: INSERTION PACEMAKER INITIAL DUAL CHAMBER INSERTION;  Surgeon: Isaias Cowman, MD;  Location: [facility];  Service: Cardiovascular;  Laterality: N/A;  . TRABECULECTOMY Right 12/19/2016   Procedure: TRABECULECTOMY WITH [facility] AND EXPRESS SHUNT;  Surgeon: Leandrew Koyanagi, MD;  Location: [facility];  Service: Ophthalmology;  Laterality: Right;  Diabetic - insulin pump sleep apnea     reports that he quit smoking about 50 years ago. He quit after 30.00 years of use. He has never used smokeless tobacco. He reports that he does not drink alcohol or use drugs.  No Known Allergies  No family history on file.  Prior to Admission medications   Medication Sig Start Date End Date Taking? Authorizing Provider  aspirin 81 MG tablet Take 81 mg by mouth at bedtime.    Yes [provider]  CALCIUM CARBONATE-VITAMIN D PO Take 1 tablet by mouth at bedtime.    Yes [provider]  dorzolamide (TRUSOPT) 2 % ophthalmic solution Place 1 drop into the right eye 2 (two) times daily.    Yes [provider]  latanoprost (XALATAN) 0.005 % ophthalmic solution Place 1 drop into the right eye at bedtime.    Yes [provider]  lisinopril (PRINIVIL,ZESTRIL) 5 MG tablet Take 5 mg by mouth at bedtime.    Yes [provider]  Omega-3 Fatty Acids (FISH OIL PO)  Take 1 capsule by mouth daily.    Yes [provider]  simvastatin (ZOCOR) 40 MG tablet Take 40 mg by mouth at bedtime.    Yes [provider]  timolol (BETIMOL) 0.5 % ophthalmic solution Place 1 drop into both eyes 2 (two) times daily.   Yes [provider]  vitamin B-12 (CYANOCOBALAMIN) 1000 MCG tablet Take 1,000 mcg by mouth daily.    Yes [provider]  cephALEXin (KEFLEX) 500 MG capsule Take 1 capsule (500 mg total) by mouth 3 (three) times daily. Patient not taking: Reported on  06/19/2019 05/13/18   Versie Starks, PA-C  diphenhydramine-acetaminophen (TYLENOL PM) 25-500 MG TABS tablet Take 1 tablet by mouth at bedtime as needed.    [provider]  furosemide (LASIX) 20 MG tablet Take 1 tablet (20 mg total) by mouth daily. 06/16/17 07/16/17  Henreitta Leber, MD  insulin aspart (NOVOLOG) 100 UNIT/ML injection Inject 30-50 Units into the skin daily. In pump.  [place] and bolus before each meal     [provider]  insulin glargine (LANTUS) 100 UNIT/ML injection Inject 15 Units into the skin as needed.     [provider]  levothyroxine (SYNTHROID, LEVOTHROID) 50 MCG tablet Take 50 mcg by mouth daily before breakfast.    [provider]  traMADol (ULTRAM) 50 MG tablet Take 1 tablet (50 mg total) by mouth every 6 (six) hours as needed. 05/13/18   Versie Starks, PA-C    Physical Exam: Vitals:   06/19/19 1512 06/19/19 1630 06/19/19 1700 06/19/19 1730  BP: (!) 178/68 (!) 173/73 (!) 164/76 (!) 168/86  Pulse: 65 61  66  Resp: '15 16 15 15  ' Temp:      TempSrc:      SpO2:      Weight:      Height:        General: Vital signs reviewed.  Frail elderly man, in no acute distress and cooperative with exam.  Head: Normocephalic and atraumatic. Eyes: EOMI, conjunctivae normal, no scleral icterus.  ENMT: Mucous membranes are moist.  Neck: Supple, trachea midline, normal ROM, no JVD, masses, thyromegaly, or carotid bruit present.  Cardiovascular: RRR, S1 normal, S2 normal, no murmurs, gallops, or rubs. Pulmonary/Chest: Clear to auscultation bilaterally, no wheezes, rales, or rhonchi. Abdominal: Soft, non-tender, non-distended, BS +, no masses, organomegaly, or guarding present.  Extremities: No lower extremity edema bilaterally,  pulses symmetric and intact bilaterally. No cyanosis or clubbing. Neurological: A&O x3, Strength is normal and symmetric bilaterally, cranial nerve II-XII are grossly intact, no focal motor deficit, sensory intact to light  touch bilaterally.  Skin: Warm, dry and intact. No rashes or erythema. Psychiatric: Normal mood and affect. speech and behavior is normal. Cognition and memory are normal.   Labs on Admission: I have personally reviewed following labs and imaging studies  CBC: Recent Labs  Lab 06/19/19 1306  WBC 7.0  NEUTROABS 4.1  HGB 11.8*  HCT 36.1*  MCV 90.3  PLT 102   Basic Metabolic Panel: Recent Labs  Lab 06/19/19 1306  NA 139  K 3.8  CL 108  CO2 23  GLUCOSE 112*  BUN 23  CREATININE 1.35*  CALCIUM 9.1   GFR: Estimated Creatinine Clearance: 33.4 mL/min (A) (by C-G formula based on SCr of 1.35 mg/dL (H)). Liver Function Tests: Recent Labs  Lab 06/19/19 1306  AST 20  ALT 16  ALKPHOS 63  BILITOT 0.7  PROT 6.5  ALBUMIN 3.8   No results  for input(s): LIPASE, AMYLASE in the last 168 hours. No results for input(s): AMMONIA in the last 168 hours. Coagulation Profile: No results for input(s): INR, PROTIME in the last 168 hours. Cardiac Enzymes: No results for input(s): CKTOTAL, CKMB, CKMBINDEX, TROPONINI in the last 168 hours. BNP (last 3 results) No results for input(s): PROBNP in the last 8760 hours. HbA1C: No results for input(s): HGBA1C in the last 72 hours. CBG: No results for input(s): GLUCAP in the last 168 hours. Lipid Profile: No results for input(s): CHOL, HDL, LDLCALC, TRIG, CHOLHDL, LDLDIRECT in the last 72 hours. Thyroid Function Tests: No results for input(s): TSH, T4TOTAL, FREET4, T3FREE, THYROIDAB in the last 72 hours. Anemia Panel: No results for input(s): VITAMINB12, FOLATE, FERRITIN, TIBC, IRON, RETICCTPCT in the last 72 hours. Urine analysis:    Component Value Date/Time   COLORURINE YELLOW (A) 06/12/2017 1308   APPEARANCEUR CLEAR (A) 06/12/2017 1308   LABSPEC 1.019 06/12/2017 1308   PHURINE 5.0 06/12/2017 1308   GLUCOSEU 150 (A) 06/12/2017 1308   HGBUR SMALL (A) 06/12/2017 1308   BILIRUBINUR NEGATIVE 06/12/2017 1308   KETONESUR 5 (A) 06/12/2017  1308   PROTEINUR 100 (A) 06/12/2017 1308   NITRITE NEGATIVE 06/12/2017 1308   LEUKOCYTESUR NEGATIVE 06/12/2017 1308    Radiological Exams on Admission: DG Chest 2 View  Result Date: 06/19/2019 CLINICAL DATA:  86 year old male with shortness of breath since yesterday. EXAM: CHEST - 2 VIEW COMPARISON:  Portable chest 06/13/2017 and earlier. FINDINGS: PA and lateral views. Stable left chest dual lead cardiac pacemaker. Improved lung volumes and ventilation compared to the 2019 portable chest. Mild asymmetric increased interstitial markings in both lungs appear chronic, and not significantly changed from a two view study on 06/07/2017. No pneumothorax, pulmonary edema, pleural effusion or confluent pulmonary opacity. Calcified aortic atherosclerosis. Normal cardiac size and mediastinal contours. No acute osseous abnormality identified. Negative visible bowel gas pattern. IMPRESSION: No acute cardiopulmonary abnormality. Electronically Signed   By: Genevie Ann M.D.   On: 06/19/2019 15:40    EKG: Independently reviewed.  Atrial paced regular rhythm.  Assessment/Plan Active Problems:   Generalized weakness   Generalized weakness/exertional dyspnea.  No chest pain.  BNP at 168 and troponin at 46.  EKG without any ST changes.  Chest x-ray unremarkable and physical exam appears euvolemic. -Trend troponin. -EchoCardiogram -PT/OT evaluation. -Admit under observation.  AKI.  Patient appears little dry, creatinine at 1.35 with baseline around 1. -IV fluid 100 mL/h for 10 hours. -Recheck BMP tomorrow.   Hypertension.  Blood pressure mildly elevated. -Continue home dose of lisinopril.  Type 1 Diabetes.  Patient is on basal and short-acting insulin at home. -Check A1c -Continue home dose of Lantus is at 15 units at bedtime. -SSI  DVT prophylaxis: Lovenox Code Status: DNR Family Communication: Discussed with patient Disposition Plan: Wants to go back home. Consults called: None Admission status:  Observation   Lorella Nimrod MD [facility]  If 7PM-7AM, please contact night-coverage [email or web address]  06/19/2019, 6:17 PM   This record has been created using Systems analyst. Errors have been sought and corrected,but may not always be located. Such creation errors do not reflect on the standard of care.

## 2019-06-19 NOTE — ED Triage Notes (Signed)
Pt to ED via POV for shortness of breath since yesterday. Pt reports the last time he felt like this is when he had to have his pacemaker put in. Pt also states that he is having lightheadedness. Per Gearldine Bienenstock, RN, when pt was being hooked up to EKG monitor, pts glucose monitor beeped and read his CBG at 62, pt is currently eating a piece of candy. Pt is in NAD.

## 2019-06-19 NOTE — ED Notes (Addendum)
Transport requested

## 2019-06-19 NOTE — ED Notes (Signed)
DNR bracelet placed on left wrist. 

## 2019-06-19 NOTE — Plan of Care (Signed)

## 2019-06-19 NOTE — ED Provider Notes (Signed)
[facility] Emergency Department Provider Note  ____________________________________________   First MD Initiated Contact with Patient 06/19/19 1510     (approximate)  I have reviewed the triage vital signs and the nursing notes.   HISTORY  Chief Complaint Shortness of Breath    HPI [name] is a 86 y.o. male  With h/o CKD, DM, HTN, coronary disease, status post pacemaker placement, here with shortness of breath.  The patient states that over the last week or so, he has had progressively worsening dyspnea with exertion.  He states that normally, is able to get around his house without difficulty.  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  He states that over the last week, initially with significant exertion but now just getting around the house, he is began to feel very short of breath and weak.  He has felt like he cannot catch his breath when walking.  When he rests, his symptoms resolved.  He denies any associated chest pain.  Has a history of coronary disease, and states that he did not have significant pain with his coronary episode in the past.  He has been taking his medication as prescribed.  No leg swelling.  No recent chills.  No cough.  No sputum production.  No other complaints.       Past Medical History:  Diagnosis Date  . Anemia   . Chronic kidney disease    "functioning at 50%"  . Coronary artery disease   . Diabetes mellitus, type 2 ([facility])   . Dysrhythmia   . Glaucoma   . Hypothyroidism   . Osteoporosis   . Pneumonia   . Sleep apnea    could not tolerate CPAP  . Wears dentures    partial upper and lower    Patient Active Problem List   Diagnosis Date Noted  . Generalized weakness 06/19/2019  . Dyspnea   . Essential hypertension   . Hyperglycemia 06/12/2017  . Pneumonia 06/07/2017    Past Surgical History:  Procedure Laterality Date  . APPENDECTOMY    . BACK SURGERY     lumbar  . CARDIAC  CATHETERIZATION  2006   stent x1  . CATARACT EXTRACTION W/PHACO Right 12/19/2016   Procedure: CATARACT EXTRACTION PHACO AND INTRAOCULAR LENS PLACEMENT ([facility]) RIGHT DIABETIC;  Surgeon: Lockie Mola, MD;  Location: [facility];  Service: Ophthalmology;  Laterality: Right;  . HERNIA REPAIR    . PACEMAKER INSERTION N/A 06/13/2017   Procedure: INSERTION PACEMAKER INITIAL DUAL CHAMBER INSERTION;  Surgeon: Marcina Millard, MD;  Location: [facility];  Service: Cardiovascular;  Laterality: N/A;  . TRABECULECTOMY Right 12/19/2016   Procedure: TRABECULECTOMY WITH [facility] AND EXPRESS SHUNT;  Surgeon: Lockie Mola, MD;  Location: [facility];  Service: Ophthalmology;  Laterality: Right;  Diabetic - insulin pump sleep apnea    Prior to Admission medications   Medication Sig Start Date End Date Taking? Authorizing Provider  aspirin 81 MG tablet Take 81 mg by mouth at bedtime.    Yes [provider]  brimonidine (ALPHAGAN) 0.2 % ophthalmic solution Place 1 drop into both eyes in the morning and at bedtime. 04/29/19  Yes [provider]  CALCIUM CARBONATE-VITAMIN D PO Take 1 tablet by mouth at bedtime.    Yes [provider]  dorzolamide (TRUSOPT) 2 % ophthalmic solution Place 1 drop into the right eye 2 (two) times daily.    Yes [provider]  insulin aspart (NOVOLOG) 100 UNIT/ML  injection Inject 30-50 Units into the skin daily. In pump.  [place] and bolus before each meal    Yes [provider]  insulin glargine (LANTUS) 100 UNIT/ML injection Inject 15 Units into the skin as needed.    Yes [provider]  latanoprost (XALATAN) 0.005 % ophthalmic solution Place 1 drop into the right eye at bedtime.    Yes [provider]  levothyroxine (SYNTHROID) 75 MCG tablet Take 75 mcg by mouth daily before breakfast.    Yes [provider]  lisinopril (PRINIVIL,ZESTRIL) 5 MG tablet Take 5 mg by mouth at bedtime.    Yes  [provider]  Omega-3 Fatty Acids (FISH OIL PO) Take 1 capsule by mouth daily.    Yes [provider]  simvastatin (ZOCOR) 40 MG tablet Take 40 mg by mouth at bedtime.    Yes [provider]  timolol (BETIMOL) 0.5 % ophthalmic solution Place 1 drop into both eyes 2 (two) times daily.   Yes [provider]  traMADol (ULTRAM) 50 MG tablet Take 1 tablet (50 mg total) by mouth every 6 (six) hours as needed. 05/13/18  Yes Fisher, Roselyn Bering, PA-C  vitamin B-12 (CYANOCOBALAMIN) 1000 MCG tablet Take 1,000 mcg by mouth daily.    Yes [provider]    Allergies Patient has no known allergies.  No family history on file.  Social History Social History   Tobacco Use  . Smoking status: Former Smoker    Years: 30.00    Quit date: 1971    Years since quitting: 50.3  . Smokeless tobacco: Never Used  Substance Use Topics  . Alcohol use: Never  . Drug use: Never    Review of Systems  Review of Systems  Constitutional: Positive for fatigue. Negative for chills and fever.  HENT: Negative for sore throat.   Respiratory: Positive for chest tightness. Negative for shortness of breath.   Cardiovascular: Positive for chest pain.  Gastrointestinal: Negative for abdominal pain.  Genitourinary: Negative for flank pain.  Musculoskeletal: Negative for neck pain.  Skin: Negative for rash and wound.  Allergic/Immunologic: Negative for immunocompromised state.  Neurological: Positive for weakness. Negative for numbness.  Hematological: Does not bruise/bleed easily.  All other systems reviewed and are negative.    ____________________________________________  PHYSICAL EXAM:      VITAL SIGNS: ED Triage Vitals  Enc Vitals Group     BP 06/19/19 1300 (!) 139/57     Pulse Rate 06/19/19 1300 66     Resp 06/19/19 1300 16     Temp 06/19/19 1300 98.2 F (36.8 C)     Temp Source 06/19/19 1300 Oral     SpO2 06/19/19 1300 98 %     Weight 06/19/19 1301 130 lb  (59 kg)     Height 06/19/19 1301 5\' 6"  (1.676 m)     Head Circumference --      Peak Flow --      Pain Score 06/19/19 1300 0     Pain Loc --      Pain Edu? --      Excl. in GC? --      Physical Exam Vitals and nursing note reviewed.  Constitutional:      General: He is not in acute distress.    Appearance: He is well-developed.  HENT:     Head: Normocephalic and atraumatic.  Eyes:     Conjunctiva/sclera: Conjunctivae normal.  Cardiovascular:     Rate and Rhythm: Normal rate and regular  rhythm.     Heart sounds: Normal heart sounds.  Pulmonary:     Effort: Pulmonary effort is normal. No respiratory distress.     Breath sounds: No wheezing.  Abdominal:     General: There is no distension.  Musculoskeletal:     Cervical back: Neck supple.  Skin:    General: Skin is warm.     Capillary Refill: Capillary refill takes less than 2 seconds.     Findings: No rash.  Neurological:     Mental Status: He is alert and oriented to person, place, and time.     Motor: No abnormal muscle tone.       ____________________________________________   LABS (all labs ordered are listed, but only abnormal results are displayed)  Labs Reviewed  CBC WITH DIFFERENTIAL/PLATELET - Abnormal; Notable for the following components:      Result Value   RBC 4.00 (*)    Hemoglobin 11.8 (*)    HCT 36.1 (*)    All other components within normal limits  BASIC METABOLIC PANEL - Abnormal; Notable for the following components:   Glucose, Bld 112 (*)    Creatinine, Ser 1.35 (*)    GFR calc non Af Amer 48 (*)    GFR calc Af Amer 55 (*)    All other components within normal limits  BRAIN NATRIURETIC PEPTIDE - Abnormal; Notable for the following components:   B Natriuretic Peptide 168.0 (*)    All other components within normal limits  TROPONIN I (HIGH SENSITIVITY) - Abnormal; Notable for the following components:   Troponin I (High Sensitivity) 46 (*)    All other components within normal limits    SARS CORONAVIRUS 2 (TAT 6-24 HRS)  HEPATIC FUNCTION PANEL  TSH  HEMOGLOBIN A1C  BASIC METABOLIC PANEL    ____________________________________________  EKG: Atrial paced rhythm.  Ventricular rate 65, PR 226, QRS 74, QTc 391.  No acute ST elevations depressions.  No evidence of acute ischemia or infarct. ________________________________________  RADIOLOGY All imaging, including plain films, CT scans, and ultrasounds, independently reviewed by me, and interpretations confirmed via formal radiology reads.  ED MD interpretation:   -CXR Clear, no focal abnormality  Official radiology report(s): DG Chest 2 View  Result Date: 06/19/2019 CLINICAL DATA:  86 year old male with shortness of breath since yesterday. EXAM: CHEST - 2 VIEW COMPARISON:  Portable chest 06/13/2017 and earlier. FINDINGS: PA and lateral views. Stable left chest dual lead cardiac pacemaker. Improved lung volumes and ventilation compared to the 2019 portable chest. Mild asymmetric increased interstitial markings in both lungs appear chronic, and not significantly changed from a two view study on 06/07/2017. No pneumothorax, pulmonary edema, pleural effusion or confluent pulmonary opacity. Calcified aortic atherosclerosis. Normal cardiac size and mediastinal contours. No acute osseous abnormality identified. Negative visible bowel gas pattern. IMPRESSION: No acute cardiopulmonary abnormality. Electronically Signed   By: Odessa Fleming M.D.   On: 06/19/2019 15:40    ____________________________________________  PROCEDURES   Procedure(s) performed (including Critical Care):  .1-3 Lead EKG Interpretation Performed by: Shaune Pollack, MD Authorized by: Shaune Pollack, MD     Interpretation: normal     ECG rate:  69   ECG rate assessment: normal     Rhythm: paced     Ectopy: none     Conduction: abnormal   Comments:     Indication: Shortness of breath, pacemaker    ____________________________________________  INITIAL  IMPRESSION / MDM / ASSESSMENT AND PLAN / ED COURSE  As part of  my medical decision making, I reviewed the following data within the [facility] notes reviewed and incorporated, Old chart reviewed, Notes from prior ED visits, and [place] Controlled Substance Database       *Logan Strong was evaluated in Emergency Department on 06/20/2019 for the symptoms described in the history of present illness. He was evaluated in the context of the global COVID-19 pandemic, which necessitated consideration that the patient might be at risk for infection with the SARS-CoV-2 virus that causes COVID-19. Institutional protocols and algorithms that pertain to the evaluation of patients at risk for COVID-19 are in a state of rapid change based on information released by regulatory bodies including the CDC and federal and state organizations. These policies and algorithms were followed during the patient's care in the ED.  Some ED evaluations and interventions may be delayed as a result of limited staffing during the pandemic.*     Medical Decision Making:  86 yo M here with SOB with exertion. EKG nonischemic. CXR clear. Trop elevated at 46 which is new for him. He has known CAD with story concerning for possible angina vs CHF. Will admit for serial trops and monitoring. PM interrogated, largely unremarkable.   ____________________________________________  FINAL CLINICAL IMPRESSION(S) / ED DIAGNOSES  Final diagnoses:  None     MEDICATIONS GIVEN DURING THIS VISIT:  Medications  enoxaparin (LOVENOX) injection 40 mg (40 mg Subcutaneous Given 06/19/19 2226)  acetaminophen (TYLENOL) tablet 650 mg (has no administration in time range)    Or  acetaminophen (TYLENOL) suppository 650 mg (has no administration in time range)  polyethylene glycol (MIRALAX / GLYCOLAX) packet 17 g (has no administration in time range)  aspirin EC tablet 81 mg (81 mg Oral Given 06/19/19 2226)  brimonidine (ALPHAGAN)  0.2 % ophthalmic solution 1 drop (has no administration in time range)  dorzolamide (TRUSOPT) 2 % ophthalmic solution 1 drop (1 drop Right Eye Not Given 06/19/19 2213)  latanoprost (XALATAN) 0.005 % ophthalmic solution 1 drop (1 drop Right Eye Not Given 06/19/19 2213)  levothyroxine (SYNTHROID) tablet 75 mcg (has no administration in time range)  simvastatin (ZOCOR) tablet 40 mg (40 mg Oral Given 06/19/19 2226)  timolol (TIMOPTIC) 0.5 % ophthalmic solution 1 drop (1 drop Both Eyes Not Given 06/19/19 2214)  vitamin B-12 (CYANOCOBALAMIN) tablet 1,000 mcg (1,000 mcg Oral Not Given 06/19/19 2006)  lisinopril (ZESTRIL) tablet 5 mg (5 mg Oral Given 06/19/19 2226)  0.9 %  sodium chloride infusion ( Intravenous New Bag/Given 06/19/19 2021)  aspirin chewable tablet 324 mg (324 mg Oral Given 06/19/19 1745)     ED Discharge Orders    None       Note:  This document was prepared using Dragon voice recognition software and may include unintentional dictation errors.   Duffy Bruce, MD 06/20/19 [phone]

## 2019-06-20 ENCOUNTER — Observation Stay
Admit: 2019-06-20 | Discharge: 2019-06-20 | Disposition: A | Discharge status: Home / Self Care | Payer: Medicare HMO | Attending: Internal Medicine | Admitting: Internal Medicine

## 2019-06-20 DIAGNOSIS — R531 Weakness: Secondary | ICD-10-CM | POA: Diagnosis not present

## 2019-06-20 DIAGNOSIS — R0602 Shortness of breath: Secondary | ICD-10-CM

## 2019-06-20 DIAGNOSIS — R778 Other specified abnormalities of plasma proteins: Secondary | ICD-10-CM | POA: Diagnosis not present

## 2019-06-20 DIAGNOSIS — I5021 Acute systolic (congestive) heart failure: Secondary | ICD-10-CM | POA: Diagnosis not present

## 2019-06-20 DIAGNOSIS — R0600 Dyspnea, unspecified: Secondary | ICD-10-CM | POA: Diagnosis not present

## 2019-06-20 DIAGNOSIS — I10 Essential (primary) hypertension: Secondary | ICD-10-CM | POA: Diagnosis not present

## 2019-06-20 LAB — SARS CORONAVIRUS 2 (TAT 6-24 HRS): SARS Coronavirus 2: NEGATIVE

## 2019-06-20 LAB — BASIC METABOLIC PANEL
Anion gap: 5 (ref 5–15)
BUN: 18 mg/dL (ref 8–23)
CO2: 22 mmol/L (ref 22–32)
Calcium: 8.4 mg/dL — ABNORMAL LOW (ref 8.9–10.3)
Chloride: 113 mmol/L — ABNORMAL HIGH (ref 98–111)
Creatinine, Ser: 1.36 mg/dL — ABNORMAL HIGH (ref 0.61–1.24)
GFR calc Af Amer: 55 mL/min — ABNORMAL LOW (ref >60)
GFR calc non Af Amer: 47 mL/min — ABNORMAL LOW (ref >60)
Glucose, Bld: 145 mg/dL — ABNORMAL HIGH (ref 70–99)
Potassium: 3.7 mmol/L (ref 3.5–5.1)
Sodium: 140 mmol/L (ref 135–145)

## 2019-06-20 LAB — ECHOCARDIOGRAM COMPLETE
Height: 66 in
Weight: 2080 oz

## 2019-06-20 LAB — GLUCOSE, CAPILLARY
Glucose-Capillary: 131 mg/dL — ABNORMAL HIGH (ref 70–99)
Glucose-Capillary: 189 mg/dL — ABNORMAL HIGH (ref 70–99)

## 2019-06-20 LAB — TROPONIN I (HIGH SENSITIVITY)
Troponin I (High Sensitivity): 30 ng/L — ABNORMAL HIGH (ref <18)
Troponin I (High Sensitivity): 31 ng/L — ABNORMAL HIGH (ref <18)

## 2019-06-20 NOTE — Progress Notes (Signed)
Patient administered a total of 3.4 units of insulin from his insulin pump.

## 2019-06-20 NOTE — Progress Notes (Signed)
Patient discharged via POV

## 2019-06-20 NOTE — Evaluation (Signed)
Physical Therapy Evaluation Patient Details Name: Logan Strong MRN: [id number] DOB: 08-01-1931 Today's Date: 06/20/2019   History of Present Illness  Patient is an 86 year old male admitted from home with weakness x 1 week. PMH includes: HTN, CAD, pacemaker, DM.  Clinical Impression  Patient received sitting at side of bed, daughter present in room. Reports he is feeling better, wants to go home today. He is agreeable to PT session. Transfers sit to stand with supervision, ambulated 180 feet, up/down 6 steps with single rail, no AD with supervision. Patient without difficulty or lob. Reports mild sob at end of session. Saturations on room air at 98%. Patient appears to be at baseline level of mobility. Does not require PT follow up at this time.      Follow Up Recommendations No PT follow up    Equipment Recommendations  None recommended by PT    Recommendations for Other Services       Precautions / Restrictions Precautions Precaution Comments: low fall Restrictions Weight Bearing Restrictions: No      Mobility  Bed Mobility               General bed mobility comments: patient received sitting up on side of bed  Transfers Overall transfer level: Needs assistance Equipment used: None Transfers: Sit to/from Stand Sit to Stand: Supervision            Ambulation/Gait Ambulation/Gait assistance: Supervision;Min guard Gait Distance (Feet): 180 Feet Assistive device: None Gait Pattern/deviations: WFL(Within Functional Limits);Step-through pattern Gait velocity: WNL      Stairs Stairs: Yes Stairs assistance: Supervision Stair Management: One rail Right;Alternating pattern Number of Stairs: 6 General stair comments: no difficulty  Wheelchair Mobility    Modified Rankin (Stroke Patients Only)       Balance Overall balance assessment: Independent                                           Pertinent Vitals/Pain Pain Assessment:  No/denies pain    Home Living Family/patient expects to be discharged to:: Private residence Living Arrangements: Spouse/significant other Available Help at Discharge: Family;Available PRN/intermittently Type of Home: House Home Access: Stairs to enter Entrance Stairs-Rails: Can reach both Entrance Stairs-Number of Steps: 5 Home Layout: One level Home Equipment: None      Prior Function Level of Independence: Independent         Comments: Patient reports he is independent, drives, daughter lives nearby and is able to assist as needed. He is caregiver for his wife.     Hand Dominance        Extremity/Trunk Assessment   Upper Extremity Assessment Upper Extremity Assessment: Overall WFL for tasks assessed    Lower Extremity Assessment Lower Extremity Assessment: Overall WFL for tasks assessed    Cervical / Trunk Assessment Cervical / Trunk Assessment: Normal  Communication   Communication: No difficulties  Cognition Arousal/Alertness: Awake/alert Behavior During Therapy: WFL for tasks assessed/performed Overall Cognitive Status: Within Functional Limits for tasks assessed                                        General Comments      Exercises     Assessment/Plan    PT Assessment Patent does not need any further PT services  PT  Problem List Decreased strength;Decreased activity tolerance;Decreased mobility       PT Treatment Interventions      PT Goals (Current goals can be found in the Care Plan section)  Acute Rehab PT Goals Patient Stated Goal: to return home today PT Goal Formulation: With patient Time For Goal Achievement: 06/27/19 Potential to Achieve Goals: Good    Frequency     Barriers to discharge        Co-evaluation               AM-PAC PT "6 Clicks" Mobility  Outcome Measure Help needed turning from your back to your side while in a flat bed without using bedrails?: None Help needed moving from lying on your  back to sitting on the side of a flat bed without using bedrails?: None Help needed moving to and from a bed to a chair (including a wheelchair)?: None Help needed standing up from a chair using your arms (e.g., wheelchair or bedside chair)?: None Help needed to walk in hospital room?: None Help needed climbing 3-5 steps with a railing? : None 6 Click Score: 24    End of Session   Activity Tolerance: Patient tolerated treatment well Patient left: in bed Nurse Communication: Mobility status PT Visit Diagnosis: Muscle weakness (generalized) (M62.81)    Time: 6389-3734 PT Time Calculation (min) (ACUTE ONLY): 14 min   Charges:   PT Evaluation $PT Eval Low Complexity: 1 Low PT Treatments $Gait Training: 8-22 mins        Tylor Gambrill, PT, GCS 06/20/19,10:15 AM

## 2019-06-20 NOTE — Progress Notes (Signed)
Patient administered a total of 3.5 units of insulin from his insulin pump

## 2019-06-20 NOTE — Progress Notes (Signed)
Patient arrived to unit and was made aware that he has an insulin pump. I notified the on call provider, BJon Billings about this. She let me know patient is able to keep insulin pump and use it as he does at home, but had to sign the insulin pump contract. This was signed by patient and placed in his chart.

## 2019-06-20 NOTE — Plan of Care (Signed)

## 2019-06-20 NOTE — Discharge Summary (Signed)
Physician Discharge Summary  [name] TFT:[id number] DOB: 1931/06/03 DOA: 06/19/2019  PCP: Logan Harrier, MD  Admit date: 06/19/2019 Discharge date: 06/20/2019  Admitted From: Home Disposition:  Home  Recommendations for Outpatient Follow-up:  1. Follow up with PCP in 1-2 weeks 2. Please obtain BMP/CBC in one week 3. Please follow up on the following pending results:Echocardiogram  Home Health:No Equipment/Devices:None Discharge Condition: Stable CODE STATUS: DNR Diet recommendation: Heart Healthy / Carb Modified    Brief/Interim Summary: Logan Strong is a 86 y.o. male with medical history significant of hypertension, CAD s/p PCI in 2006, pacemaker, diabetes and hypothyroidism came to ED with complaint of worsening generalized weakness and some questionable shortness of breath for about 1 week. Patient denies any chest pain, palpitations, orthopnea or PND.  No fever or chills.  No upper respiratory symptoms.  No nausea or vomiting.  No recent change in his bowel habits or appetite.  No urinary symptoms.  No sick contacts. He lives with his wife who has dementia and is her primary caretaker. On arrival he was hemodynamically stable, labs with BNP of 168 and troponin of 46, creatinine of 1.35, chest x-ray unremarkable, EKG without any acute change. Next set of troponin started trending down.  He was given 1 L of fluid.  Next morning he was feeling better.  Renal function remained stable around 1.3 with unknown baseline.  He was able to participate with PT and OT and did very well. Echocardiogram was done-pending results. Patient remained chest pain-free.  He needs to follow-up with his primary care physician as an outpatient for further management or necessity work-up.  We will continue with his home meds.  Discharge Diagnoses:  Active Problems:   Generalized weakness  Discharge Instructions  Discharge Instructions    Diet - low sodium heart healthy   Complete by: As  directed    Discharge instructions   Complete by: As directed    It was pleasure taking care of you. Please follow-up with your primary care physician or cardiologist for further work-up of your weakness and shortness of breath. Continue your with your medications as you were doing it before. Keep yourself well-hydrated   Increase activity slowly   Complete by: As directed      Allergies as of 06/20/2019   No Known Allergies     Medication List    TAKE these medications   aspirin 81 MG tablet Take 81 mg by mouth at bedtime.   brimonidine 0.2 % ophthalmic solution Commonly known as: ALPHAGAN Place 1 drop into both eyes in the morning and at bedtime.   CALCIUM CARBONATE-VITAMIN D PO Take 1 tablet by mouth at bedtime.   dorzolamide 2 % ophthalmic solution Commonly known as: TRUSOPT Place 1 drop into the right eye 2 (two) times daily.   FISH OIL PO Take 1 capsule by mouth daily.   insulin aspart 100 UNIT/ML injection Commonly known as: novoLOG Inject 30-50 Units into the skin daily. In pump.  [place] and bolus before each meal   insulin glargine 100 UNIT/ML injection Commonly known as: LANTUS Inject 15 Units into the skin as needed.   latanoprost 0.005 % ophthalmic solution Commonly known as: XALATAN Place 1 drop into the right eye at bedtime.   levothyroxine 75 MCG tablet Commonly known as: SYNTHROID Take 75 mcg by mouth daily before breakfast.   lisinopril 5 MG tablet Commonly known as: ZESTRIL Take 5 mg by mouth at bedtime.   simvastatin 40 MG tablet Commonly  known as: ZOCOR Take 40 mg by mouth at bedtime.   timolol 0.5 % ophthalmic solution Commonly known as: BETIMOL Place 1 drop into both eyes 2 (two) times daily.   traMADol 50 MG tablet Commonly known as: ULTRAM Take 1 tablet (50 mg total) by mouth every 6 (six) hours as needed.   vitamin B-12 1000 MCG tablet Commonly known as: CYANOCOBALAMIN Take 1,000 mcg by mouth daily.       No Known  Allergies  Consultations:  None  Procedures/Studies: DG Chest 2 View  Result Date: 06/19/2019 CLINICAL DATA:  86 year old male with shortness of breath since yesterday. EXAM: CHEST - 2 VIEW COMPARISON:  Portable chest 06/13/2017 and earlier. FINDINGS: PA and lateral views. Stable left chest dual lead cardiac pacemaker. Improved lung volumes and ventilation compared to the 2019 portable chest. Mild asymmetric increased interstitial markings in both lungs appear chronic, and not significantly changed from a two view study on 06/07/2017. No pneumothorax, pulmonary edema, pleural effusion or confluent pulmonary opacity. Calcified aortic atherosclerosis. Normal cardiac size and mediastinal contours. No acute osseous abnormality identified. Negative visible bowel gas pattern. IMPRESSION: No acute cardiopulmonary abnormality. Electronically Signed   By: Odessa Fleming M.D.   On: 06/19/2019 15:40     Subjective: Patient is feeling much better when seen today.  He was accompanied by his daughter in the room.  No new complaints.  Discharge Exam: Vitals:   06/20/19 0728 06/20/19 1007  BP: (!) 156/66   Pulse: 67   Resp: 16   Temp: 98 F (36.7 C)   SpO2: 98% 98%   Vitals:   06/19/19 2151 06/19/19 2327 06/20/19 0728 06/20/19 1007  BP: (!) 131/53 (!) 108/51 (!) 156/66   Pulse: 66 (!) 59 67   Resp: 20 20 16    Temp: 97.8 F (36.6 C) 97.7 F (36.5 C) 98 F (36.7 C)   TempSrc:   Oral   SpO2: 100% 99% 98% 98%  Weight:      Height:        General: Pt is alert, awake, not in acute distress Cardiovascular: RRR, S1/S2 +, no rubs, no gallops Respiratory: CTA bilaterally, no wheezing, no rhonchi Abdominal: Soft, NT, ND, bowel sounds + Extremities: no edema, no cyanosis   The results of significant diagnostics from this hospitalization (including imaging, microbiology, ancillary and laboratory) are listed below for reference.    Microbiology: Recent Results (from the past 240 hour(s))  SARS  CORONAVIRUS 2 (TAT 6-24 HRS) Nasopharyngeal Nasopharyngeal Swab     Status: None   Collection Time: 06/19/19  5:46 PM   Specimen: Nasopharyngeal Swab  Result Value Ref Range Status   SARS Coronavirus 2 NEGATIVE NEGATIVE Final    Comment: (NOTE) SARS-CoV-2 target nucleic acids are NOT DETECTED. The SARS-CoV-2 RNA is generally detectable in upper and lower respiratory specimens during the acute phase of infection. Negative results do not preclude SARS-CoV-2 infection, do not rule out co-infections with other pathogens, and should not be used as the sole basis for treatment or other patient management decisions. Negative results must be combined with clinical observations, patient history, and epidemiological information. The expected result is Negative. Fact Sheet for Patients: 08/19/19 Fact Sheet for Healthcare Providers: HairSlick.no This test is not yet approved or cleared by the [place] FDA and  has been authorized for detection and/or diagnosis of SARS-CoV-2 by FDA under an Emergency Use Authorization (EUA). This EUA will remain  in effect (meaning this test can be used) for the duration of  the COVID-19 declaration under Section 56 4(b)(1) of the Act, 21 U.S.C. section 360bbb-3(b)(1), unless the authorization is terminated or revoked sooner. Performed at [facility], [address], [place], [place] [address]      Labs: BNP (last 3 results) Recent Labs    06/19/19 1306  BNP 168.0*   Basic Metabolic Panel: Recent Labs  Lab 06/19/19 1306 06/20/19 0538  NA 139 140  K 3.8 3.7  CL 108 113*  CO2 23 22  GLUCOSE 112* 145*  BUN 23 18  CREATININE 1.35* 1.36*  CALCIUM 9.1 8.4*   Liver Function Tests: Recent Labs  Lab 06/19/19 1306  AST 20  ALT 16  ALKPHOS 63  BILITOT 0.7  PROT 6.5  ALBUMIN 3.8   No results for input(s): LIPASE, AMYLASE in the last 168 hours. No results for input(s):  AMMONIA in the last 168 hours. CBC: Recent Labs  Lab 06/19/19 1306  WBC 7.0  NEUTROABS 4.1  HGB 11.8*  HCT 36.1*  MCV 90.3  PLT 196   Cardiac Enzymes: No results for input(s): CKTOTAL, CKMB, CKMBINDEX, TROPONINI in the last 168 hours. BNP: Invalid input(s): POCBNP CBG: Recent Labs  Lab 06/20/19 0726 06/20/19 1146  GLUCAP 131* 189*   D-Dimer No results for input(s): DDIMER in the last 72 hours. Hgb A1c No results for input(s): HGBA1C in the last 72 hours. Lipid Profile No results for input(s): CHOL, HDL, LDLCALC, TRIG, CHOLHDL, LDLDIRECT in the last 72 hours. Thyroid function studies Recent Labs    06/19/19 1306  TSH 2.290   Anemia work up No results for input(s): VITAMINB12, FOLATE, FERRITIN, TIBC, IRON, RETICCTPCT in the last 72 hours. Urinalysis    Component Value Date/Time   COLORURINE YELLOW (A) 06/12/2017 1308   APPEARANCEUR CLEAR (A) 06/12/2017 1308   LABSPEC 1.019 06/12/2017 1308   PHURINE 5.0 06/12/2017 1308   GLUCOSEU 150 (A) 06/12/2017 1308   HGBUR SMALL (A) 06/12/2017 1308   BILIRUBINUR NEGATIVE 06/12/2017 1308   KETONESUR 5 (A) 06/12/2017 1308   PROTEINUR 100 (A) 06/12/2017 1308   NITRITE NEGATIVE 06/12/2017 1308   LEUKOCYTESUR NEGATIVE 06/12/2017 1308   Sepsis Labs Invalid input(s): PROCALCITONIN,  WBC,  LACTICIDVEN Microbiology Recent Results (from the past 240 hour(s))  SARS CORONAVIRUS 2 (TAT 6-24 HRS) Nasopharyngeal Nasopharyngeal Swab     Status: None   Collection Time: 06/19/19  5:46 PM   Specimen: Nasopharyngeal Swab  Result Value Ref Range Status   SARS Coronavirus 2 NEGATIVE NEGATIVE Final    Comment: (NOTE) SARS-CoV-2 target nucleic acids are NOT DETECTED. The SARS-CoV-2 RNA is generally detectable in upper and lower respiratory specimens during the acute phase of infection. Negative results do not preclude SARS-CoV-2 infection, do not rule out co-infections with other pathogens, and should not be used as the sole basis for  treatment or other patient management decisions. Negative results must be combined with clinical observations, patient history, and epidemiological information. The expected result is Negative. Fact Sheet for Patients: HairSlick.no Fact Sheet for Healthcare Providers: quierodirigir.com This test is not yet approved or cleared by the [place] FDA and  has been authorized for detection and/or diagnosis of SARS-CoV-2 by FDA under an Emergency Use Authorization (EUA). This EUA will remain  in effect (meaning this test can be used) for the duration of the COVID-19 declaration under Section 56 4(b)(1) of the Act, 21 U.S.C. section 360bbb-3(b)(1), unless the authorization is terminated or revoked sooner. Performed at [facility], [address],  [place], [place] [address]     Time coordinating discharge: Over 30 minutes  SIGNED:  Arnetha Courser, MD  [facility] 06/20/2019, 11:58 AM  If 7PM-7AM, please contact night-coverage [email or web address]  This record has been created using Conservation officer, historic buildings. Errors have been sought and corrected,but may not always be located. Such creation errors do not reflect on the standard of care.

## 2019-06-20 NOTE — Evaluation (Signed)
Occupational Therapy Evaluation Patient Details Name: Logan Strong MRN: [id number] DOB: [birth year] Today's Date: 06/20/2019    History of Present Illness Patient is an 86 year old male admitted from home with weakness x 1 week. PMH includes: HTN, CAD, pacemaker, DM.   Clinical Impression   Mr Keetch was seen for OT evaluation this date. Prior to hospital admission, pt was Independent for I/ADLs including caregiving for wife c dementia. Pt's daughter lives nearby and checks in every day and was present in room during session. Currently pt requires MOD I for in room functional mobility, toileting, and LBD. Pt and caregiver instructed in energy conservation (handout provided) and IS (provided) frequency and use. Pt demonstrates baseline independence to perform ADL and mobility tasks and no strength, sensory, coordination, cognitive, or visual deficits appreciated with assessment. No skilled OT needs identified. Will sign off. Please re-consult if additional OT needs arise.    Follow Up Recommendations  No OT follow up    Equipment Recommendations  None recommended by OT    Recommendations for Other Services       Precautions / Restrictions Precautions Precautions: None Precaution Comments: low fall Restrictions Weight Bearing Restrictions: No      Mobility Bed Mobility Overal bed mobility: Modified Independent             General bed mobility comments: HOB elevated ~30* sup>sit MOD I  Transfers Overall transfer level: Modified independent Equipment used: None Transfers: Sit to/from Stand Sit to Stand: Modified independent (Device/Increase time)         General transfer comment: sit<>stand at standard commode and at EOB c MOD I for increased time     Balance Overall balance assessment: Independent                                         ADL either performed or assessed with clinical judgement   ADL Overall ADL's : Modified independent                                        General ADL Comments: Upon arrival pt has completed tooth brushing and full body dressing and is toileting at commode. Independent toileting (including hand washing), don/doff B socks long sitting in bed, and hair brushing seated EOB.      Vision Baseline Vision/History: Wears glasses Wears Glasses: At all times       Perception     Praxis      Pertinent Vitals/Pain Pain Assessment: No/denies pain     Hand Dominance     Extremity/Trunk Assessment Upper Extremity Assessment Upper Extremity Assessment: Overall WFL for tasks assessed   Lower Extremity Assessment Lower Extremity Assessment: Overall WFL for tasks assessed   Cervical / Trunk Assessment Cervical / Trunk Assessment: Normal   Communication Communication Communication: No difficulties   Cognition Arousal/Alertness: Awake/alert Behavior During Therapy: WFL for tasks assessed/performed Overall Cognitive Status: Within Functional Limits for tasks assessed                                     General Comments       Exercises Exercises: Other exercises Other Exercises Other Exercises: Pt and family educated re: falls prevention, energy conservation (handout provided),  IS use and frequency, home/routine modifications Other Exercises: Toileting, sup<>sit, sit<>stand, hand washing, LBD,    Shoulder Instructions      Home Living Family/patient expects to be discharged to:: Private residence Living Arrangements: Spouse/significant other(Pt cares for wife c dementia) Available Help at Discharge: Family;Available PRN/intermittently(Daughter lives ~1 mile away and checks in daily) Type of Home: House Home Access: Stairs to enter Technical brewer of Steps: 5 Entrance Stairs-Rails: Can reach both Home Layout: One level     Bathroom Shower/Tub: Occupational psychologist: Standard     Home Equipment: Shower seat          Prior  Functioning/Environment Level of Independence: Independent        Comments: Patient reports he is independent, drives, daughter lives nearby and is able to assist as needed. He is caregiver for his wife.        OT Problem List: Decreased knowledge of use of DME or AE;Decreased activity tolerance      OT Treatment/Interventions:      OT Goals(Current goals can be found in the care plan section) Acute Rehab OT Goals Patient Stated Goal: to return home today OT Goal Formulation: With patient/family Time For Goal Achievement: 07/04/19 Potential to Achieve Goals: Good  OT Frequency:     Barriers to D/C:            Co-evaluation              AM-PAC OT "6 Clicks" Daily Activity     Outcome Measure Help from another person eating meals?: None Help from another person taking care of personal grooming?: None Help from another person toileting, which includes using toliet, bedpan, or urinal?: None Help from another person bathing (including washing, rinsing, drying)?: None Help from another person to put on and taking off regular upper body clothing?: None Help from another person to put on and taking off regular lower body clothing?: None 6 Click Score: 24   End of Session    Activity Tolerance: Patient tolerated treatment well Patient left: in bed;Other (comment)(seated EOB c MD in room )  OT Visit Diagnosis: Other abnormalities of gait and mobility (R26.89);Unsteadiness on feet (R26.81)                Time: 5400-8676 OT Time Calculation (min): 16 min Charges:  OT General Charges $OT Visit: 1 Visit OT Evaluation $OT Eval Low Complexity: 1 Low OT Treatments $Self Care/Home Management : 8-22 mins  Dessie Coma, M.S. OTR/L  06/20/19, 11:14 AM

## 2019-06-22 LAB — HEMOGLOBIN A1C
Hgb A1c MFr Bld: 6.9 % — ABNORMAL HIGH (ref 4.8–5.6)
Mean Plasma Glucose: 151 mg/dL

## 2019-06-23 DIAGNOSIS — N183 Chronic kidney disease, stage 3 unspecified: Secondary | ICD-10-CM | POA: Diagnosis not present

## 2019-06-23 DIAGNOSIS — I442 Atrioventricular block, complete: Secondary | ICD-10-CM | POA: Diagnosis not present

## 2019-06-23 DIAGNOSIS — I25119 Atherosclerotic heart disease of native coronary artery with unspecified angina pectoris: Secondary | ICD-10-CM | POA: Diagnosis not present

## 2019-06-23 DIAGNOSIS — R001 Bradycardia, unspecified: Secondary | ICD-10-CM | POA: Diagnosis not present

## 2019-06-23 DIAGNOSIS — G4733 Obstructive sleep apnea (adult) (pediatric): Secondary | ICD-10-CM | POA: Diagnosis not present

## 2019-06-23 DIAGNOSIS — I2510 Atherosclerotic heart disease of native coronary artery without angina pectoris: Secondary | ICD-10-CM | POA: Diagnosis not present

## 2019-06-23 DIAGNOSIS — I10 Essential (primary) hypertension: Secondary | ICD-10-CM | POA: Diagnosis not present

## 2019-06-23 DIAGNOSIS — R42 Dizziness and giddiness: Secondary | ICD-10-CM | POA: Diagnosis not present

## 2019-06-23 DIAGNOSIS — R0602 Shortness of breath: Secondary | ICD-10-CM | POA: Diagnosis not present

## 2019-06-27 DIAGNOSIS — E109 Type 1 diabetes mellitus without complications: Secondary | ICD-10-CM | POA: Diagnosis not present

## 2019-06-30 DIAGNOSIS — Z9641 Presence of insulin pump (external) (internal): Secondary | ICD-10-CM | POA: Diagnosis not present

## 2019-06-30 DIAGNOSIS — E10649 Type 1 diabetes mellitus with hypoglycemia without coma: Secondary | ICD-10-CM | POA: Diagnosis not present

## 2019-07-13 ENCOUNTER — Ambulatory Visit: Payer: Medicare HMO | Admitting: Dermatology

## 2019-07-13 ENCOUNTER — Other Ambulatory Visit: Payer: Self-pay

## 2019-07-13 ENCOUNTER — Encounter: Payer: Self-pay | Admitting: Dermatology

## 2019-07-13 DIAGNOSIS — L570 Actinic keratosis: Secondary | ICD-10-CM | POA: Diagnosis not present

## 2019-07-13 DIAGNOSIS — L578 Other skin changes due to chronic exposure to nonionizing radiation: Secondary | ICD-10-CM

## 2019-07-13 MED ORDER — FLUOROURACIL 5 % EX SOLN: 1.0000 "application " | Freq: Every day | CUTANEOUS | 1 refills | Status: DC

## 2019-07-13 NOTE — Progress Notes (Signed)
   Follow-Up Visit   Subjective  Logan Strong is a 86 y.o. male who presents for the following: Follow-up (AK follow up scalp and forehead - PDT 05/2019).    The following portions of the chart were reviewed this encounter and updated as appropriate:  Tobacco  Allergies  Meds  Problems  Med Hx  Surg Hx  Fam Hx      Review of Systems:  No other skin or systemic complaints except as noted in HPI or Assessment and Plan.  Objective  Well appearing patient in no apparent distress; mood and affect are within normal limits.  A focused examination was performed including scalp, face. Relevant physical exam findings are noted in the Assessment and Plan.  Objective  Scalp (10): Erythematous thin papules/macules with gritty scale.    Assessment & Plan    Actinic Damage - diffuse scaly erythematous macules with underlying dyspigmentation - Recommend daily broad spectrum sunscreen SPF 30+ to sun-exposed areas, reapply every 2 hours as needed.  - Call for new or changing lesions. - 5-FU solution to scalp for AK field tx  AK (actinic keratosis) (10) Scalp  Start Fluorouracil solution in one month  Destruction of lesion - Scalp Complexity: simple   Destruction method: cryotherapy   Informed consent: discussed and consent obtained   Timeout:  patient name, date of birth, surgical site, and procedure verified Lesion destroyed using liquid nitrogen: Yes   Region frozen until ice ball extended beyond lesion: Yes   Outcome: patient tolerated procedure well with no complications   Post-procedure details: wound care instructions given    Fluorouracil 5 % SOLN - Scalp  Return in about 3 months (around 10/13/2019) for AK follow up.  I, Joanie Coddington, CMA, am acting as scribe for Armida Sans, MD .    Documentation: I have reviewed the above documentation for accuracy and completeness, and I agree with the above.  Armida Sans, MD

## 2019-07-13 NOTE — Patient Instructions (Signed)

## 2019-07-20 DIAGNOSIS — I25119 Atherosclerotic heart disease of native coronary artery with unspecified angina pectoris: Secondary | ICD-10-CM | POA: Diagnosis not present

## 2019-07-20 DIAGNOSIS — R0602 Shortness of breath: Secondary | ICD-10-CM | POA: Diagnosis not present

## 2019-07-27 DIAGNOSIS — R0602 Shortness of breath: Secondary | ICD-10-CM | POA: Diagnosis not present

## 2019-07-27 DIAGNOSIS — R001 Bradycardia, unspecified: Secondary | ICD-10-CM | POA: Diagnosis not present

## 2019-07-27 DIAGNOSIS — I10 Essential (primary) hypertension: Secondary | ICD-10-CM | POA: Diagnosis not present

## 2019-07-27 DIAGNOSIS — I25119 Atherosclerotic heart disease of native coronary artery with unspecified angina pectoris: Secondary | ICD-10-CM | POA: Diagnosis not present

## 2019-07-27 DIAGNOSIS — I2510 Atherosclerotic heart disease of native coronary artery without angina pectoris: Secondary | ICD-10-CM | POA: Diagnosis not present

## 2019-07-27 DIAGNOSIS — I442 Atrioventricular block, complete: Secondary | ICD-10-CM | POA: Diagnosis not present

## 2019-07-27 DIAGNOSIS — I495 Sick sinus syndrome: Secondary | ICD-10-CM | POA: Diagnosis not present

## 2019-07-30 DIAGNOSIS — N1832 Chronic kidney disease, stage 3b: Secondary | ICD-10-CM | POA: Diagnosis not present

## 2019-07-30 DIAGNOSIS — R5381 Other malaise: Secondary | ICD-10-CM | POA: Diagnosis not present

## 2019-07-30 DIAGNOSIS — E1065 Type 1 diabetes mellitus with hyperglycemia: Secondary | ICD-10-CM | POA: Diagnosis not present

## 2019-07-30 DIAGNOSIS — Z9641 Presence of insulin pump (external) (internal): Secondary | ICD-10-CM | POA: Diagnosis not present

## 2019-07-30 DIAGNOSIS — G4733 Obstructive sleep apnea (adult) (pediatric): Secondary | ICD-10-CM | POA: Diagnosis not present

## 2019-07-30 DIAGNOSIS — D649 Anemia, unspecified: Secondary | ICD-10-CM | POA: Diagnosis not present

## 2019-07-30 DIAGNOSIS — I2510 Atherosclerotic heart disease of native coronary artery without angina pectoris: Secondary | ICD-10-CM | POA: Diagnosis not present

## 2019-07-30 DIAGNOSIS — E782 Mixed hyperlipidemia: Secondary | ICD-10-CM | POA: Diagnosis not present

## 2019-07-30 DIAGNOSIS — I10 Essential (primary) hypertension: Secondary | ICD-10-CM | POA: Diagnosis not present

## 2019-08-06 DIAGNOSIS — I129 Hypertensive chronic kidney disease with stage 1 through stage 4 chronic kidney disease, or unspecified chronic kidney disease: Secondary | ICD-10-CM | POA: Diagnosis not present

## 2019-08-06 DIAGNOSIS — E1122 Type 2 diabetes mellitus with diabetic chronic kidney disease: Secondary | ICD-10-CM | POA: Diagnosis not present

## 2019-08-06 DIAGNOSIS — N183 Chronic kidney disease, stage 3 unspecified: Secondary | ICD-10-CM | POA: Diagnosis not present

## 2019-08-06 DIAGNOSIS — M8580 Other specified disorders of bone density and structure, unspecified site: Secondary | ICD-10-CM | POA: Diagnosis not present

## 2019-08-06 DIAGNOSIS — E039 Hypothyroidism, unspecified: Secondary | ICD-10-CM | POA: Diagnosis not present

## 2019-08-06 DIAGNOSIS — Z9641 Presence of insulin pump (external) (internal): Secondary | ICD-10-CM | POA: Diagnosis not present

## 2019-08-06 DIAGNOSIS — H409 Unspecified glaucoma: Secondary | ICD-10-CM | POA: Diagnosis not present

## 2019-08-06 DIAGNOSIS — G4700 Insomnia, unspecified: Secondary | ICD-10-CM | POA: Diagnosis not present

## 2019-08-06 DIAGNOSIS — Z0000 Encounter for general adult medical examination without abnormal findings: Secondary | ICD-10-CM | POA: Diagnosis not present

## 2019-08-21 DIAGNOSIS — E109 Type 1 diabetes mellitus without complications: Secondary | ICD-10-CM | POA: Diagnosis not present

## 2019-09-10 DIAGNOSIS — H401131 Primary open-angle glaucoma, bilateral, mild stage: Secondary | ICD-10-CM | POA: Diagnosis not present

## 2019-09-15 DIAGNOSIS — E109 Type 1 diabetes mellitus without complications: Secondary | ICD-10-CM | POA: Diagnosis not present

## 2019-09-29 DIAGNOSIS — Z9641 Presence of insulin pump (external) (internal): Secondary | ICD-10-CM | POA: Diagnosis not present

## 2019-09-29 DIAGNOSIS — E10649 Type 1 diabetes mellitus with hypoglycemia without coma: Secondary | ICD-10-CM | POA: Diagnosis not present

## 2019-10-05 DIAGNOSIS — N183 Chronic kidney disease, stage 3 unspecified: Secondary | ICD-10-CM | POA: Diagnosis not present

## 2019-10-05 DIAGNOSIS — I2510 Atherosclerotic heart disease of native coronary artery without angina pectoris: Secondary | ICD-10-CM | POA: Diagnosis not present

## 2019-10-05 DIAGNOSIS — E1122 Type 2 diabetes mellitus with diabetic chronic kidney disease: Secondary | ICD-10-CM | POA: Diagnosis not present

## 2019-10-05 DIAGNOSIS — I129 Hypertensive chronic kidney disease with stage 1 through stage 4 chronic kidney disease, or unspecified chronic kidney disease: Secondary | ICD-10-CM | POA: Diagnosis not present

## 2019-10-05 DIAGNOSIS — Z008 Encounter for other general examination: Secondary | ICD-10-CM | POA: Diagnosis not present

## 2019-10-05 DIAGNOSIS — Z636 Dependent relative needing care at home: Secondary | ICD-10-CM | POA: Diagnosis not present

## 2019-10-20 DIAGNOSIS — I495 Sick sinus syndrome: Secondary | ICD-10-CM | POA: Diagnosis not present

## 2019-10-29 ENCOUNTER — Other Ambulatory Visit: Payer: Self-pay

## 2019-10-29 ENCOUNTER — Ambulatory Visit: Payer: Medicare HMO | Admitting: Dermatology

## 2019-10-29 DIAGNOSIS — L570 Actinic keratosis: Secondary | ICD-10-CM

## 2019-10-29 DIAGNOSIS — L578 Other skin changes due to chronic exposure to nonionizing radiation: Secondary | ICD-10-CM | POA: Diagnosis not present

## 2019-10-29 NOTE — Progress Notes (Signed)
   Follow-Up Visit   Subjective  Logan Strong is a 86 y.o. male who presents for the following: Actinic Keratosis (S/P 5FU sol. to the scalp - patient started June 3rd and used for a few weeks).  The following portions of the chart were reviewed this encounter and updated as appropriate:  Tobacco  Allergies  Meds  Problems  Med Hx  Surg Hx  Fam Hx     Review of Systems:  No other skin or systemic complaints except as noted in HPI or Assessment and Plan.  Objective  Well appearing patient in no apparent distress; mood and affect are within normal limits.  A focused examination was performed including the face and scalp. Relevant physical exam findings are noted in the Assessment and Plan.  Objective  Scalp (16): Erythematous thin papules/macules with gritty scale.   Assessment & Plan  AK (actinic keratosis) (16) Scalp  Patient does still have 5FU - OK to spot treat rough patches   Destruction of lesion - Scalp Complexity: simple   Destruction method: cryotherapy   Informed consent: discussed and consent obtained   Timeout:  patient name, date of birth, surgical site, and procedure verified Lesion destroyed using liquid nitrogen: Yes   Region frozen until ice ball extended beyond lesion: Yes   Outcome: patient tolerated procedure well with no complications   Post-procedure details: wound care instructions given    Other Related Medications Fluorouracil 5 % SOLN   Actinic Damage - diffuse scaly erythematous macules with underlying dyspigmentation - Recommend daily broad spectrum sunscreen SPF 30+ to sun-exposed areas, reapply every 2 hours as needed.  - Call for new or changing lesions.  Return in about 5 months (around 03/30/2020) for AK f/u .  Maylene Roes, CMA, am acting as scribe for Armida Sans, MD .  Documentation: I have reviewed the above documentation for accuracy and completeness, and I agree with the above.  Armida Sans, MD

## 2019-11-09 ENCOUNTER — Encounter: Payer: Self-pay | Admitting: Dermatology

## 2019-11-10 DIAGNOSIS — Z79899 Other long term (current) drug therapy: Secondary | ICD-10-CM | POA: Diagnosis not present

## 2019-11-10 DIAGNOSIS — E10649 Type 1 diabetes mellitus with hypoglycemia without coma: Secondary | ICD-10-CM | POA: Diagnosis not present

## 2019-11-10 DIAGNOSIS — M810 Age-related osteoporosis without current pathological fracture: Secondary | ICD-10-CM | POA: Diagnosis not present

## 2019-11-10 DIAGNOSIS — Z9641 Presence of insulin pump (external) (internal): Secondary | ICD-10-CM | POA: Diagnosis not present

## 2019-11-10 DIAGNOSIS — E034 Atrophy of thyroid (acquired): Secondary | ICD-10-CM | POA: Diagnosis not present

## 2019-11-19 ENCOUNTER — Other Ambulatory Visit: Payer: Self-pay

## 2019-11-19 ENCOUNTER — Ambulatory Visit: Payer: Medicare HMO | Admitting: Dermatology

## 2019-11-19 DIAGNOSIS — L309 Dermatitis, unspecified: Secondary | ICD-10-CM | POA: Diagnosis not present

## 2019-11-19 DIAGNOSIS — L570 Actinic keratosis: Secondary | ICD-10-CM

## 2019-11-19 DIAGNOSIS — D692 Other nonthrombocytopenic purpura: Secondary | ICD-10-CM

## 2019-11-19 NOTE — Progress Notes (Signed)
   Follow-Up Visit   Subjective  Logan Strong is a 86 y.o. male who presents for the following: Other (Spot of right forearm that came up while he was painting some shutters 2 days ago. It itches and it is sore and seems to have spread a little bit overnight.).  The following portions of the chart were reviewed this encounter and updated as appropriate:  Tobacco  Allergies  Meds  Problems  Med Hx  Surg Hx  Fam Hx     Review of Systems:  No other skin or systemic complaints except as noted in HPI or Assessment and Plan.  Objective  Well appearing patient in no apparent distress; mood and affect are within normal limits.  A focused examination was performed including right forearm, scalp. Relevant physical exam findings are noted in the Assessment and Plan.  Objective  Right Forearm - Posterior: Purpura   Objective  Right Forearm - Posterior: Pinkness  Objective  Scalp: Healing Ln2 sites   Assessment & Plan  Other nonthrombocytopenic purpura (HCC) Right Forearm - Posterior Purpura with puritis Trauma related Benign-appearing nature discussed.  Eucrisa for itch.  Eczema, pruritus associated with his purpura Right Forearm - Posterior Eucrisa oint qd-bid - samples given  AK (actinic keratosis) Scalp Ln2 site are healing. Recheck areas on follow up  Fluorouracil 5 % SOLN - Scalp  Return for follow up as scheduled.  I, Joanie Coddington, CMA, am acting as scribe for Armida Sans, MD .  Documentation: I have reviewed the above documentation for accuracy and completeness, and I agree with the above.  Armida Sans, MD

## 2019-11-20 ENCOUNTER — Encounter: Payer: Self-pay | Admitting: Dermatology

## 2019-11-20 DIAGNOSIS — N1831 Chronic kidney disease, stage 3a: Secondary | ICD-10-CM | POA: Diagnosis not present

## 2019-11-20 DIAGNOSIS — M8589 Other specified disorders of bone density and structure, multiple sites: Secondary | ICD-10-CM | POA: Diagnosis not present

## 2019-11-30 DIAGNOSIS — Z9641 Presence of insulin pump (external) (internal): Secondary | ICD-10-CM | POA: Diagnosis not present

## 2019-11-30 DIAGNOSIS — I2510 Atherosclerotic heart disease of native coronary artery without angina pectoris: Secondary | ICD-10-CM | POA: Diagnosis not present

## 2019-11-30 DIAGNOSIS — I10 Essential (primary) hypertension: Secondary | ICD-10-CM | POA: Diagnosis not present

## 2019-11-30 DIAGNOSIS — E1065 Type 1 diabetes mellitus with hyperglycemia: Secondary | ICD-10-CM | POA: Diagnosis not present

## 2019-11-30 DIAGNOSIS — N289 Disorder of kidney and ureter, unspecified: Secondary | ICD-10-CM | POA: Diagnosis not present

## 2019-11-30 DIAGNOSIS — E7800 Pure hypercholesterolemia, unspecified: Secondary | ICD-10-CM | POA: Diagnosis not present

## 2019-11-30 DIAGNOSIS — M8580 Other specified disorders of bone density and structure, unspecified site: Secondary | ICD-10-CM | POA: Diagnosis not present

## 2019-12-01 DIAGNOSIS — E109 Type 1 diabetes mellitus without complications: Secondary | ICD-10-CM | POA: Diagnosis not present

## 2019-12-04 DIAGNOSIS — E109 Type 1 diabetes mellitus without complications: Secondary | ICD-10-CM | POA: Diagnosis not present

## 2019-12-07 DIAGNOSIS — I129 Hypertensive chronic kidney disease with stage 1 through stage 4 chronic kidney disease, or unspecified chronic kidney disease: Secondary | ICD-10-CM | POA: Diagnosis not present

## 2019-12-07 DIAGNOSIS — D649 Anemia, unspecified: Secondary | ICD-10-CM | POA: Diagnosis not present

## 2019-12-07 DIAGNOSIS — Z794 Long term (current) use of insulin: Secondary | ICD-10-CM | POA: Diagnosis not present

## 2019-12-07 DIAGNOSIS — N183 Chronic kidney disease, stage 3 unspecified: Secondary | ICD-10-CM | POA: Diagnosis not present

## 2019-12-07 DIAGNOSIS — E1122 Type 2 diabetes mellitus with diabetic chronic kidney disease: Secondary | ICD-10-CM | POA: Diagnosis not present

## 2019-12-07 DIAGNOSIS — Z23 Encounter for immunization: Secondary | ICD-10-CM | POA: Diagnosis not present

## 2019-12-15 DIAGNOSIS — Z9641 Presence of insulin pump (external) (internal): Secondary | ICD-10-CM | POA: Diagnosis not present

## 2019-12-15 DIAGNOSIS — E10649 Type 1 diabetes mellitus with hypoglycemia without coma: Secondary | ICD-10-CM | POA: Diagnosis not present

## 2019-12-15 DIAGNOSIS — E034 Atrophy of thyroid (acquired): Secondary | ICD-10-CM | POA: Diagnosis not present

## 2020-01-11 DIAGNOSIS — Z79899 Other long term (current) drug therapy: Secondary | ICD-10-CM | POA: Diagnosis not present

## 2020-01-11 DIAGNOSIS — N1831 Chronic kidney disease, stage 3a: Secondary | ICD-10-CM | POA: Diagnosis not present

## 2020-01-11 DIAGNOSIS — M8589 Other specified disorders of bone density and structure, multiple sites: Secondary | ICD-10-CM | POA: Diagnosis not present

## 2020-01-11 DIAGNOSIS — E10649 Type 1 diabetes mellitus with hypoglycemia without coma: Secondary | ICD-10-CM | POA: Diagnosis not present

## 2020-01-11 DIAGNOSIS — E034 Atrophy of thyroid (acquired): Secondary | ICD-10-CM | POA: Diagnosis not present

## 2020-01-11 DIAGNOSIS — Z9641 Presence of insulin pump (external) (internal): Secondary | ICD-10-CM | POA: Diagnosis not present

## 2020-01-25 DIAGNOSIS — I442 Atrioventricular block, complete: Secondary | ICD-10-CM | POA: Diagnosis not present

## 2020-01-25 DIAGNOSIS — I25119 Atherosclerotic heart disease of native coronary artery with unspecified angina pectoris: Secondary | ICD-10-CM | POA: Diagnosis not present

## 2020-01-25 DIAGNOSIS — R0602 Shortness of breath: Secondary | ICD-10-CM | POA: Diagnosis not present

## 2020-01-25 DIAGNOSIS — R001 Bradycardia, unspecified: Secondary | ICD-10-CM | POA: Diagnosis not present

## 2020-01-25 DIAGNOSIS — I495 Sick sinus syndrome: Secondary | ICD-10-CM | POA: Diagnosis not present

## 2020-01-25 DIAGNOSIS — R42 Dizziness and giddiness: Secondary | ICD-10-CM | POA: Diagnosis not present

## 2020-01-25 DIAGNOSIS — I10 Essential (primary) hypertension: Secondary | ICD-10-CM | POA: Diagnosis not present

## 2020-01-25 DIAGNOSIS — N183 Chronic kidney disease, stage 3 unspecified: Secondary | ICD-10-CM | POA: Diagnosis not present

## 2020-01-25 DIAGNOSIS — Z950 Presence of cardiac pacemaker: Secondary | ICD-10-CM | POA: Diagnosis not present

## 2020-01-26 DIAGNOSIS — I442 Atrioventricular block, complete: Secondary | ICD-10-CM | POA: Diagnosis not present

## 2020-03-01 DIAGNOSIS — E10649 Type 1 diabetes mellitus with hypoglycemia without coma: Secondary | ICD-10-CM | POA: Diagnosis not present

## 2020-03-01 DIAGNOSIS — E109 Type 1 diabetes mellitus without complications: Secondary | ICD-10-CM | POA: Diagnosis not present

## 2020-03-17 ENCOUNTER — Ambulatory Visit: Payer: Medicare HMO | Admitting: Dermatology

## 2020-03-24 DIAGNOSIS — H401131 Primary open-angle glaucoma, bilateral, mild stage: Secondary | ICD-10-CM | POA: Diagnosis not present

## 2020-04-07 DIAGNOSIS — M8589 Other specified disorders of bone density and structure, multiple sites: Secondary | ICD-10-CM | POA: Diagnosis not present

## 2020-04-07 DIAGNOSIS — E1122 Type 2 diabetes mellitus with diabetic chronic kidney disease: Secondary | ICD-10-CM | POA: Diagnosis not present

## 2020-04-07 DIAGNOSIS — Z79899 Other long term (current) drug therapy: Secondary | ICD-10-CM | POA: Diagnosis not present

## 2020-04-07 DIAGNOSIS — N183 Chronic kidney disease, stage 3 unspecified: Secondary | ICD-10-CM | POA: Diagnosis not present

## 2020-04-12 DIAGNOSIS — Z79899 Other long term (current) drug therapy: Secondary | ICD-10-CM | POA: Diagnosis not present

## 2020-04-12 DIAGNOSIS — M810 Age-related osteoporosis without current pathological fracture: Secondary | ICD-10-CM | POA: Diagnosis not present

## 2020-04-12 DIAGNOSIS — Z9641 Presence of insulin pump (external) (internal): Secondary | ICD-10-CM | POA: Diagnosis not present

## 2020-04-12 DIAGNOSIS — E034 Atrophy of thyroid (acquired): Secondary | ICD-10-CM | POA: Diagnosis not present

## 2020-04-12 DIAGNOSIS — E10649 Type 1 diabetes mellitus with hypoglycemia without coma: Secondary | ICD-10-CM | POA: Diagnosis not present

## 2020-04-24 IMAGING — CR DG CHEST 2V
1 series · 2 of 2 positions shown · non-contrast
Comparison: Portable chest 06/13/2017 and earlier.

CLINICAL DATA: 87-year-old male with shortness of breath since
yesterday.

EXAM:
CHEST - 2 VIEW

[Series 1: w chest pa · 0.14mm/px · 2 of 2 slices shown]
[im 1/2]
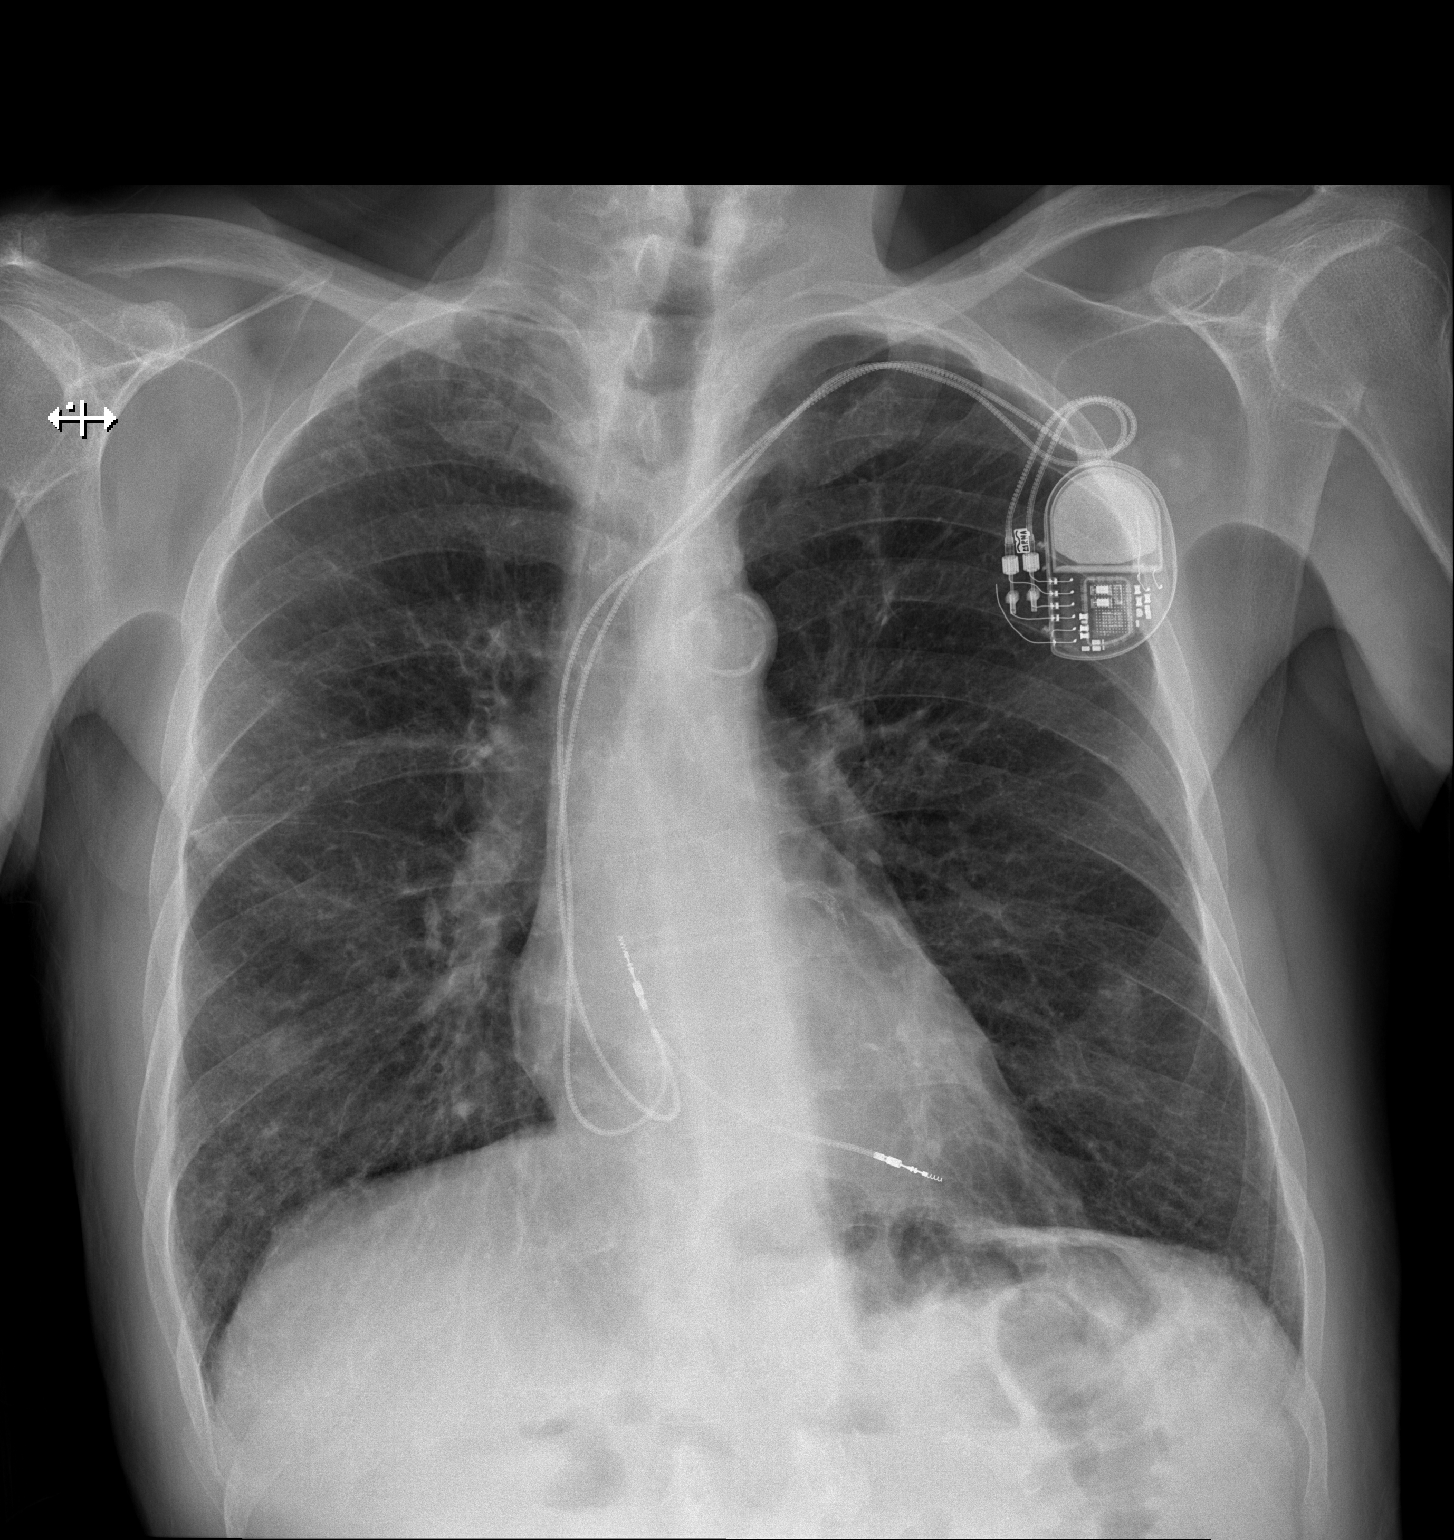
[im 2/2]
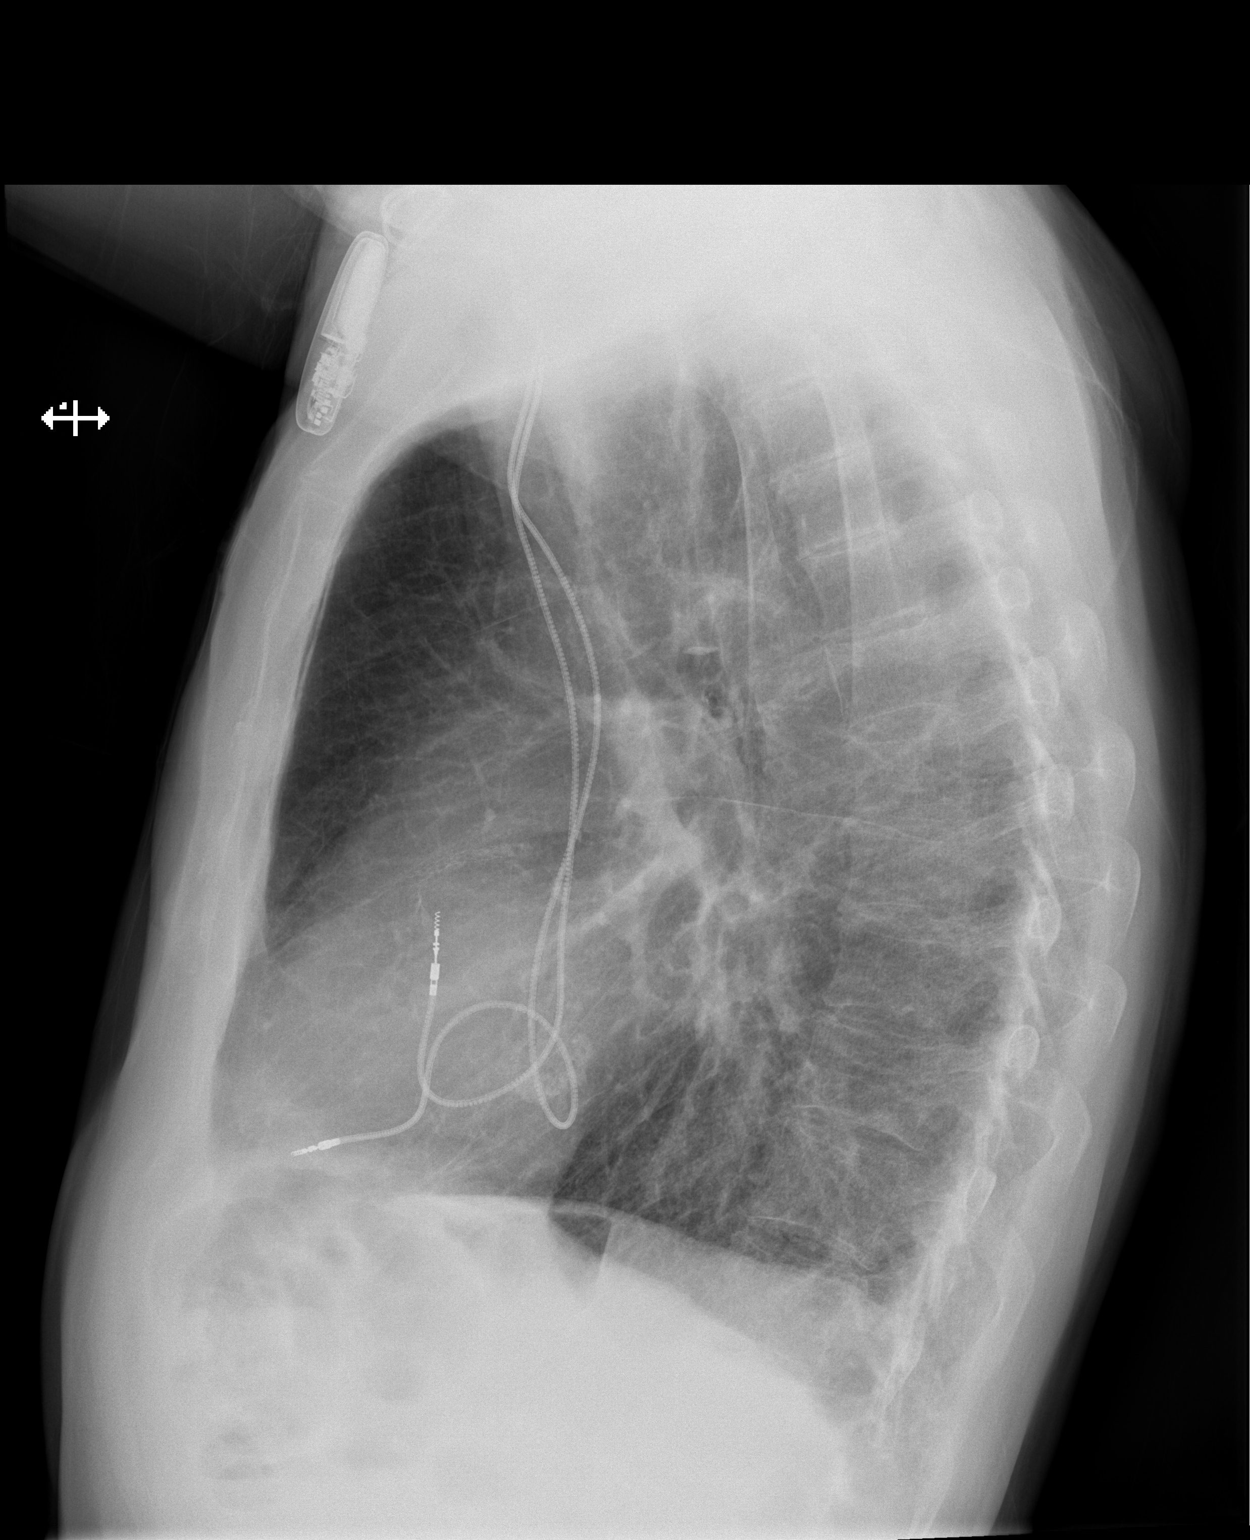

[2 of 2 positions shown; findings below may reference images not displayed]

FINDINGS: PA and lateral views. Stable left chest dual lead cardiac pacemaker.
Improved lung volumes and ventilation compared to the 6948 portable
chest. Mild asymmetric increased interstitial markings in both lungs
appear chronic, and not significantly changed from a two view study
on 06/07/2017. No pneumothorax, pulmonary edema, pleural effusion or
confluent pulmonary opacity. Calcified aortic atherosclerosis.
Normal cardiac size and mediastinal contours. No acute osseous
abnormality identified. Negative visible bowel gas pattern.
IMPRESSION: No acute cardiopulmonary abnormality.

## 2020-04-26 DIAGNOSIS — R001 Bradycardia, unspecified: Secondary | ICD-10-CM | POA: Diagnosis not present

## 2020-05-20 DIAGNOSIS — E109 Type 1 diabetes mellitus without complications: Secondary | ICD-10-CM | POA: Diagnosis not present

## 2020-06-27 ENCOUNTER — Ambulatory Visit: Payer: Medicare HMO | Admitting: Dermatology

## 2020-06-28 ENCOUNTER — Other Ambulatory Visit: Payer: Self-pay

## 2020-06-28 ENCOUNTER — Ambulatory Visit: Payer: Medicare HMO | Admitting: Dermatology

## 2020-06-28 DIAGNOSIS — E034 Atrophy of thyroid (acquired): Secondary | ICD-10-CM | POA: Diagnosis not present

## 2020-06-28 DIAGNOSIS — L570 Actinic keratosis: Secondary | ICD-10-CM | POA: Diagnosis not present

## 2020-06-28 DIAGNOSIS — E10649 Type 1 diabetes mellitus with hypoglycemia without coma: Secondary | ICD-10-CM | POA: Diagnosis not present

## 2020-06-28 DIAGNOSIS — L578 Other skin changes due to chronic exposure to nonionizing radiation: Secondary | ICD-10-CM | POA: Diagnosis not present

## 2020-06-28 DIAGNOSIS — M8589 Other specified disorders of bone density and structure, multiple sites: Secondary | ICD-10-CM | POA: Diagnosis not present

## 2020-06-28 NOTE — Progress Notes (Signed)
   Follow-Up Visit   Subjective  Logan Strong is a 87 y.o. male who presents for the following: Follow-up (Patient here today for follow up on ak on scalp. He was last seen in September. He denies other concerns today at follow up. ).   The following portions of the chart were reviewed this encounter and updated as appropriate:     Objective  Well appearing patient in no apparent distress; mood and affect are within normal limits.  A focused examination was performed including scalp. Relevant physical exam findings are noted in the Assessment and Plan.  Objective  vertex scalp x 6, right frontal scalp x 3 (9): Erythematous thin papules/macules with gritty scale.   Assessment & Plan  Actinic keratosis (9) vertex scalp x 6, right frontal scalp x 3  Prior to procedure, discussed risks of blister formation, small wound, skin dyspigmentation, or rare scar following cryotherapy.     Destruction of lesion - vertex scalp x 6, right frontal scalp x 3  Destruction method: cryotherapy   Informed consent: discussed and consent obtained   Lesion destroyed using liquid nitrogen: Yes   Region frozen until ice ball extended beyond lesion: Yes   Outcome: patient tolerated procedure well with no complications   Post-procedure details: wound care instructions given    Actinic Damage - chronic, secondary to cumulative UV radiation exposure/sun exposure over time - diffuse scaly erythematous macules with underlying dyspigmentation - Recommend daily broad spectrum sunscreen SPF 30+ to sun-exposed areas, reapply every 2 hours as needed.  - Recommend staying in the shade or wearing long sleeves, sun glasses (UVA+UVB protection) and wide brim hats (4-inch brim around the entire circumference of the hat). - Call for new or changing lesions.  Return in about 6 months (around 12/28/2020) for ak followup with Dr. Verdell Face, Asher Muir, CMA, am acting as scribe for Willeen Niece,  MD.  Documentation: I have reviewed the above documentation for accuracy and completeness, and I agree with the above.  Willeen Niece MD

## 2020-06-28 NOTE — Patient Instructions (Signed)
Cryotherapy Aftercare  . Wash gently with soap and water everyday.   . Apply Vaseline and Band-Aid daily until healed.  Recommend daily broad spectrum sunscreen SPF 30+ to sun-exposed areas, reapply every 2 hours as needed. Call for new or changing lesions.  Staying in the shade or wearing long sleeves, sun glasses (UVA+UVB protection) and wide brim hats (4-inch brim around the entire circumference of the hat) are also recommended for sun protection.   If you have any questions or concerns for your doctor, please call our main line at [phone] and press option 4 to reach your doctor's medical assistant. If no one answers, please leave a voicemail as directed and we will return your call as soon as possible. Messages left after 4 pm will be answered the following business day.   You may also send us a message via MyChart. We typically respond to MyChart messages within 1-2 business days.  For prescription refills, please ask your pharmacy to contact our office. Our fax number is [phone].  If you have an urgent issue when the clinic is closed that cannot wait until the next business day, you can page your doctor at the number below.    Please note that while we do our best to be available for urgent issues outside of office hours, we are not available 24/7.   If you have an urgent issue and are unable to reach us, you may choose to seek medical care at your doctor's office, retail clinic, urgent care center, or emergency room.  If you have a medical emergency, please immediately call 911 or go to the emergency department.  Pager Numbers  - Dr. Kowalski: [phone]  - Dr. Moye: [phone]  - Dr. Stewart: [phone]  In the event of inclement weather, please call our main line at [phone] for an update on the status of any delays or closures.  Dermatology Medication Tips: Please keep the boxes that topical medications come in in order to help keep track of the  instructions about where and how to use these. Pharmacies typically print the medication instructions only on the boxes and not directly on the medication tubes.   If your medication is too expensive, please contact our office at [phone] option 4 or send us a message through MyChart.   We are unable to tell what your co-pay for medications will be in advance as this is different depending on your insurance coverage. However, we may be able to find a substitute medication at lower cost or fill out paperwork to get insurance to cover a needed medication.   If a prior authorization is required to get your medication covered by your insurance company, please allow us 1-2 business days to complete this process.  Drug prices often vary depending on where the prescription is filled and some pharmacies may offer cheaper prices.  The website www.goodrx.com contains coupons for medications through different pharmacies. The prices here do not account for what the cost may be with help from insurance (it may be cheaper with your insurance), but the website can give you the price if you did not use any insurance.  - You can print the associated coupon and take it with your prescription to the pharmacy.  - You may also stop by our office during regular business hours and pick up a GoodRx coupon card.  - If you need your prescription sent electronically to a different pharmacy, notify our office through [facility] MyChart or by phone at [phone]   option 4.  

## 2020-07-21 DIAGNOSIS — N183 Chronic kidney disease, stage 3 unspecified: Secondary | ICD-10-CM | POA: Diagnosis not present

## 2020-07-21 DIAGNOSIS — Z950 Presence of cardiac pacemaker: Secondary | ICD-10-CM | POA: Diagnosis not present

## 2020-07-21 DIAGNOSIS — E782 Mixed hyperlipidemia: Secondary | ICD-10-CM | POA: Diagnosis not present

## 2020-07-21 DIAGNOSIS — E079 Disorder of thyroid, unspecified: Secondary | ICD-10-CM | POA: Diagnosis not present

## 2020-07-21 DIAGNOSIS — I2510 Atherosclerotic heart disease of native coronary artery without angina pectoris: Secondary | ICD-10-CM | POA: Diagnosis not present

## 2020-07-21 DIAGNOSIS — E1022 Type 1 diabetes mellitus with diabetic chronic kidney disease: Secondary | ICD-10-CM | POA: Diagnosis not present

## 2020-07-21 DIAGNOSIS — G4733 Obstructive sleep apnea (adult) (pediatric): Secondary | ICD-10-CM | POA: Diagnosis not present

## 2020-08-03 DIAGNOSIS — Z125 Encounter for screening for malignant neoplasm of prostate: Secondary | ICD-10-CM | POA: Diagnosis not present

## 2020-08-03 DIAGNOSIS — E7800 Pure hypercholesterolemia, unspecified: Secondary | ICD-10-CM | POA: Diagnosis not present

## 2020-08-03 DIAGNOSIS — M8589 Other specified disorders of bone density and structure, multiple sites: Secondary | ICD-10-CM | POA: Diagnosis not present

## 2020-08-03 DIAGNOSIS — I2510 Atherosclerotic heart disease of native coronary artery without angina pectoris: Secondary | ICD-10-CM | POA: Diagnosis not present

## 2020-08-03 DIAGNOSIS — E1069 Type 1 diabetes mellitus with other specified complication: Secondary | ICD-10-CM | POA: Diagnosis not present

## 2020-08-03 DIAGNOSIS — N1831 Chronic kidney disease, stage 3a: Secondary | ICD-10-CM | POA: Diagnosis not present

## 2020-08-10 DIAGNOSIS — Z0000 Encounter for general adult medical examination without abnormal findings: Secondary | ICD-10-CM | POA: Diagnosis not present

## 2020-08-10 DIAGNOSIS — I129 Hypertensive chronic kidney disease with stage 1 through stage 4 chronic kidney disease, or unspecified chronic kidney disease: Secondary | ICD-10-CM | POA: Diagnosis not present

## 2020-08-10 DIAGNOSIS — E1122 Type 2 diabetes mellitus with diabetic chronic kidney disease: Secondary | ICD-10-CM | POA: Diagnosis not present

## 2020-08-10 DIAGNOSIS — D631 Anemia in chronic kidney disease: Secondary | ICD-10-CM | POA: Diagnosis not present

## 2020-08-10 DIAGNOSIS — Z794 Long term (current) use of insulin: Secondary | ICD-10-CM | POA: Diagnosis not present

## 2020-08-10 DIAGNOSIS — Z950 Presence of cardiac pacemaker: Secondary | ICD-10-CM | POA: Diagnosis not present

## 2020-08-10 DIAGNOSIS — E039 Hypothyroidism, unspecified: Secondary | ICD-10-CM | POA: Diagnosis not present

## 2020-08-10 DIAGNOSIS — N183 Chronic kidney disease, stage 3 unspecified: Secondary | ICD-10-CM | POA: Diagnosis not present

## 2020-08-10 DIAGNOSIS — Z9641 Presence of insulin pump (external) (internal): Secondary | ICD-10-CM | POA: Diagnosis not present

## 2020-08-30 DIAGNOSIS — I442 Atrioventricular block, complete: Secondary | ICD-10-CM | POA: Diagnosis not present

## 2020-09-09 DIAGNOSIS — E10649 Type 1 diabetes mellitus with hypoglycemia without coma: Secondary | ICD-10-CM | POA: Diagnosis not present

## 2020-09-20 DIAGNOSIS — H401131 Primary open-angle glaucoma, bilateral, mild stage: Secondary | ICD-10-CM | POA: Diagnosis not present

## 2020-09-21 DIAGNOSIS — E10649 Type 1 diabetes mellitus with hypoglycemia without coma: Secondary | ICD-10-CM | POA: Diagnosis not present

## 2020-09-27 DIAGNOSIS — Z0100 Encounter for examination of eyes and vision without abnormal findings: Secondary | ICD-10-CM | POA: Diagnosis not present

## 2020-09-27 DIAGNOSIS — H401131 Primary open-angle glaucoma, bilateral, mild stage: Secondary | ICD-10-CM | POA: Diagnosis not present

## 2020-10-13 DIAGNOSIS — M8589 Other specified disorders of bone density and structure, multiple sites: Secondary | ICD-10-CM | POA: Diagnosis not present

## 2020-10-13 DIAGNOSIS — H04123 Dry eye syndrome of bilateral lacrimal glands: Secondary | ICD-10-CM | POA: Diagnosis not present

## 2020-10-13 DIAGNOSIS — E10649 Type 1 diabetes mellitus with hypoglycemia without coma: Secondary | ICD-10-CM | POA: Diagnosis not present

## 2020-10-13 DIAGNOSIS — E034 Atrophy of thyroid (acquired): Secondary | ICD-10-CM | POA: Diagnosis not present

## 2020-11-01 DIAGNOSIS — H1033 Unspecified acute conjunctivitis, bilateral: Secondary | ICD-10-CM | POA: Diagnosis not present

## 2020-11-07 DIAGNOSIS — H109 Unspecified conjunctivitis: Secondary | ICD-10-CM | POA: Diagnosis not present

## 2020-11-07 DIAGNOSIS — B9689 Other specified bacterial agents as the cause of diseases classified elsewhere: Secondary | ICD-10-CM | POA: Diagnosis not present

## 2020-11-07 DIAGNOSIS — J019 Acute sinusitis, unspecified: Secondary | ICD-10-CM | POA: Diagnosis not present

## 2020-11-28 DIAGNOSIS — E10649 Type 1 diabetes mellitus with hypoglycemia without coma: Secondary | ICD-10-CM | POA: Diagnosis not present

## 2020-12-07 DIAGNOSIS — Z9641 Presence of insulin pump (external) (internal): Secondary | ICD-10-CM | POA: Diagnosis not present

## 2020-12-07 DIAGNOSIS — I10 Essential (primary) hypertension: Secondary | ICD-10-CM | POA: Diagnosis not present

## 2020-12-07 DIAGNOSIS — I2510 Atherosclerotic heart disease of native coronary artery without angina pectoris: Secondary | ICD-10-CM | POA: Diagnosis not present

## 2020-12-07 DIAGNOSIS — E1065 Type 1 diabetes mellitus with hyperglycemia: Secondary | ICD-10-CM | POA: Diagnosis not present

## 2020-12-07 DIAGNOSIS — D649 Anemia, unspecified: Secondary | ICD-10-CM | POA: Diagnosis not present

## 2020-12-07 DIAGNOSIS — Z0000 Encounter for general adult medical examination without abnormal findings: Secondary | ICD-10-CM | POA: Diagnosis not present

## 2020-12-07 DIAGNOSIS — E782 Mixed hyperlipidemia: Secondary | ICD-10-CM | POA: Diagnosis not present

## 2020-12-07 DIAGNOSIS — E034 Atrophy of thyroid (acquired): Secondary | ICD-10-CM | POA: Diagnosis not present

## 2020-12-07 DIAGNOSIS — R972 Elevated prostate specific antigen [PSA]: Secondary | ICD-10-CM | POA: Diagnosis not present

## 2020-12-07 DIAGNOSIS — N289 Disorder of kidney and ureter, unspecified: Secondary | ICD-10-CM | POA: Diagnosis not present

## 2020-12-07 DIAGNOSIS — Z79899 Other long term (current) drug therapy: Secondary | ICD-10-CM | POA: Diagnosis not present

## 2020-12-14 DIAGNOSIS — Z950 Presence of cardiac pacemaker: Secondary | ICD-10-CM | POA: Diagnosis not present

## 2020-12-14 DIAGNOSIS — D649 Anemia, unspecified: Secondary | ICD-10-CM | POA: Diagnosis not present

## 2020-12-14 DIAGNOSIS — E1122 Type 2 diabetes mellitus with diabetic chronic kidney disease: Secondary | ICD-10-CM | POA: Diagnosis not present

## 2020-12-14 DIAGNOSIS — Z9641 Presence of insulin pump (external) (internal): Secondary | ICD-10-CM | POA: Diagnosis not present

## 2020-12-14 DIAGNOSIS — Z636 Dependent relative needing care at home: Secondary | ICD-10-CM | POA: Diagnosis not present

## 2020-12-14 DIAGNOSIS — N183 Chronic kidney disease, stage 3 unspecified: Secondary | ICD-10-CM | POA: Diagnosis not present

## 2020-12-14 DIAGNOSIS — M8580 Other specified disorders of bone density and structure, unspecified site: Secondary | ICD-10-CM | POA: Diagnosis not present

## 2020-12-14 DIAGNOSIS — R972 Elevated prostate specific antigen [PSA]: Secondary | ICD-10-CM | POA: Diagnosis not present

## 2020-12-14 DIAGNOSIS — I129 Hypertensive chronic kidney disease with stage 1 through stage 4 chronic kidney disease, or unspecified chronic kidney disease: Secondary | ICD-10-CM | POA: Diagnosis not present

## 2020-12-22 ENCOUNTER — Ambulatory Visit: Payer: Medicare HMO | Admitting: Dermatology

## 2020-12-22 ENCOUNTER — Encounter: Payer: Self-pay | Admitting: Dermatology

## 2020-12-22 ENCOUNTER — Other Ambulatory Visit: Payer: Self-pay

## 2020-12-22 DIAGNOSIS — L578 Other skin changes due to chronic exposure to nonionizing radiation: Secondary | ICD-10-CM | POA: Diagnosis not present

## 2020-12-22 DIAGNOSIS — L820 Inflamed seborrheic keratosis: Secondary | ICD-10-CM

## 2020-12-22 DIAGNOSIS — L570 Actinic keratosis: Secondary | ICD-10-CM | POA: Diagnosis not present

## 2020-12-22 NOTE — Patient Instructions (Addendum)
Prior to procedure, discussed risks of blister formation, small wound, skin dyspigmentation, or rare scar following cryotherapy. Recommend Vaseline ointment to treated areas while healing.   Cryotherapy Aftercare  Wash gently with soap and water everyday.   Apply Vaseline and Band-Aid daily until healed.   - Will schedule photodynamic therapy to the scalp, 1 month.   If you have any questions or concerns for your doctor, please call our main line at [phone] and press option 4 to reach your doctor's medical assistant. If no one answers, please leave a voicemail as directed and we will return your call as soon as possible. Messages left after 4 pm will be answered the following business day.   You may also send [place] a message via MyChart. We typically respond to MyChart messages within 1-2 business days.  For prescription refills, please ask your pharmacy to contact our office. Our fax number is [phone].  If you have an urgent issue when the clinic is closed that cannot wait until the next business day, you can page your doctor at the number below.    Please note that while we do our best to be available for urgent issues outside of office hours, we are not available 24/7.   If you have an urgent issue and are unable to reach [place], you may choose to seek medical care at your doctor's office, retail clinic, urgent care center, or emergency room.  If you have a medical emergency, please immediately call 911 or go to the emergency department.  Pager Numbers  - Dr. Gwen Pounds: [phone]  - Dr. Neale Burly: [phone]  - Dr. Roseanne Reno: [phone]  In the event of inclement weather, please call our main line at [phone] for an update on the status of any delays or closures.  Dermatology Medication Tips: Please keep the boxes that topical medications come in in order to help keep track of the instructions about where and how to use these. Pharmacies typically print the medication  instructions only on the boxes and not directly on the medication tubes.   If your medication is too expensive, please contact our office at [phone] option 4 or send [place] a message through MyChart.   We are unable to tell what your co-pay for medications will be in advance as this is different depending on your insurance coverage. However, we may be able to find a substitute medication at lower cost or fill out paperwork to get insurance to cover a needed medication.   If a prior authorization is required to get your medication covered by your insurance company, please allow [place] 1-2 business days to complete this process.  Drug prices often vary depending on where the prescription is filled and some pharmacies may offer cheaper prices.  The website www.goodrx.com contains coupons for medications through different pharmacies. The prices here do not account for what the cost may be with help from insurance (it may be cheaper with your insurance), but the website can give you the price if you did not use any insurance.  - You can print the associated coupon and take it with your prescription to the pharmacy.  - You may also stop by our office during regular business hours and pick up a GoodRx coupon card.  - If you need your prescription sent electronically to a different pharmacy, notify our office through [facility] or by phone at [phone] option 4.

## 2020-12-22 NOTE — Progress Notes (Signed)
Follow-Up Visit   Subjective  Logan Strong is a 87 y.o. male who presents for the following: Follow-up (6 month recheck. AK's. Scalp. Pink, scaly. Mild. ).  The following portions of the chart were reviewed this encounter and updated as appropriate:  Tobacco  Allergies  Meds  Problems  Med Hx  Surg Hx  Fam Hx     Review of Systems: No other skin or systemic complaints except as noted in HPI or Assessment and Plan.  Objective  Well appearing patient in no apparent distress; mood and affect are within normal limits.  A focused examination was performed including head, including the scalp, face, neck, nose, ears, eyelids, and lips. Relevant physical exam findings are noted in the Assessment and Plan.  scalp x15 (15) Erythematous thin papules/macules with gritty scale.   scalp x3 (3) Erythematous keratotic or waxy stuck-on papule or plaque.   Assessment & Plan  AK (actinic keratosis) (15) scalp x15  Actinic keratoses are precancerous spots that appear secondary to cumulative UV radiation exposure/sun exposure over time. They are chronic with expected duration over 1 year. A portion of actinic keratoses will progress to squamous cell carcinoma of the skin. It is not possible to reliably predict which spots will progress to skin cancer and so treatment is recommended to prevent development of skin cancer.  Recommend daily broad spectrum sunscreen SPF 30+ to sun-exposed areas, reapply every 2 hours as needed.  Recommend staying in the shade or wearing long sleeves, sun glasses (UVA+UVB protection) and wide brim hats (4-inch brim around the entire circumference of the hat). Call for new or changing lesions.  Actinic Damage - Severe, confluent actinic changes with pre-cancerous actinic keratoses  - Severe, chronic, not at goal, secondary to cumulative UV radiation exposure over time - diffuse scaly erythematous macules and papules with underlying dyspigmentation - Discussed  Prescription "Field Treatment" for Severe, Chronic Confluent Actinic Changes with Pre-Cancerous Actinic Keratoses Field treatment involves treatment of an entire area of skin that has confluent Actinic Changes (Sun/ Ultraviolet light damage) and PreCancerous Actinic Keratoses by method of PhotoDynamic Therapy (PDT) and/or prescription Topical Chemotherapy agents such as 5-fluorouracil, 5-fluorouracil/calcipotriene, and/or imiquimod.  The purpose is to decrease the number of clinically evident and subclinical PreCancerous lesions to prevent progression to development of skin cancer by chemically destroying early precancer changes that may or may not be visible.  It has been shown to reduce the risk of developing skin cancer in the treated area. As a result of treatment, redness, scaling, crusting, and open sores may occur during treatment course. One or more than one of these methods may be used and may have to be used several times to control, suppress and eliminate the PreCancerous changes. Discussed treatment course, expected reaction, and possible side effects. - Recommend daily broad spectrum sunscreen SPF 30+ to sun-exposed areas, reapply every 2 hours as needed.  - Staying in the shade or wearing long sleeves, sun glasses (UVA+UVB protection) and wide brim hats (4-inch brim around the entire circumference of the hat) are also recommended. - Call for new or changing lesions.  - Will schedule photodynamic therapy to the scalp. 1 month.  Destruction of lesion - scalp x15 Complexity: simple   Destruction method: cryotherapy   Informed consent: discussed and consent obtained   Timeout:  patient name, date of birth, surgical site, and procedure verified Lesion destroyed using liquid nitrogen: Yes   Region frozen until ice ball extended beyond lesion: Yes   Outcome: patient  tolerated procedure well with no complications   Post-procedure details: wound care instructions given    Related  Medications Fluorouracil 5 % SOLN Apply 1 application topically daily. X 2 weeks to scalp. To start August 13, 2019  Inflamed seborrheic keratosis scalp x3  Destruction of lesion - scalp x3 Complexity: simple   Destruction method: cryotherapy   Informed consent: discussed and consent obtained   Timeout:  patient name, date of birth, surgical site, and procedure verified Lesion destroyed using liquid nitrogen: Yes   Region frozen until ice ball extended beyond lesion: Yes   Outcome: patient tolerated procedure well with no complications   Post-procedure details: wound care instructions given    Actinic Damage - chronic, secondary to cumulative UV radiation exposure/sun exposure over time - diffuse scaly erythematous macules with underlying dyspigmentation - Recommend daily broad spectrum sunscreen SPF 30+ to sun-exposed areas, reapply every 2 hours as needed.  - Recommend staying in the shade or wearing long sleeves, sun glasses (UVA+UVB protection) and wide brim hats (4-inch brim around the entire circumference of the hat). - Call for new or changing lesions.  Return for PDT 1 month, AK recheck 6 months.  I, Lawson Radar, CMA, am acting as scribe for Armida Sans, MD.  Documentation: I have reviewed the above documentation for accuracy and completeness, and I agree with the above.  Armida Sans, MD

## 2021-01-18 DIAGNOSIS — N183 Chronic kidney disease, stage 3 unspecified: Secondary | ICD-10-CM | POA: Diagnosis not present

## 2021-01-18 DIAGNOSIS — R001 Bradycardia, unspecified: Secondary | ICD-10-CM | POA: Diagnosis not present

## 2021-01-18 DIAGNOSIS — I2510 Atherosclerotic heart disease of native coronary artery without angina pectoris: Secondary | ICD-10-CM | POA: Diagnosis not present

## 2021-01-18 DIAGNOSIS — E782 Mixed hyperlipidemia: Secondary | ICD-10-CM | POA: Diagnosis not present

## 2021-01-18 DIAGNOSIS — I10 Essential (primary) hypertension: Secondary | ICD-10-CM | POA: Diagnosis not present

## 2021-01-18 DIAGNOSIS — I442 Atrioventricular block, complete: Secondary | ICD-10-CM | POA: Diagnosis not present

## 2021-01-18 DIAGNOSIS — E1022 Type 1 diabetes mellitus with diabetic chronic kidney disease: Secondary | ICD-10-CM | POA: Diagnosis not present

## 2021-01-18 DIAGNOSIS — G4733 Obstructive sleep apnea (adult) (pediatric): Secondary | ICD-10-CM | POA: Diagnosis not present

## 2021-01-18 DIAGNOSIS — I495 Sick sinus syndrome: Secondary | ICD-10-CM | POA: Diagnosis not present

## 2021-01-25 ENCOUNTER — Other Ambulatory Visit: Payer: Self-pay

## 2021-01-25 ENCOUNTER — Ambulatory Visit: Payer: Medicare HMO

## 2021-01-25 DIAGNOSIS — L570 Actinic keratosis: Secondary | ICD-10-CM

## 2021-01-25 MED ORDER — AMINOLEVULINIC ACID HCL 20 % EX SOLR
1.0000 "application " | Freq: Once | CUTANEOUS | Status: AC
  Administered 2021-01-25: 354 mg via TOPICAL

## 2021-01-25 NOTE — Progress Notes (Signed)
Patient completed PDT therapy today.  1. AK (actinic keratosis) Scalp  Photodynamic therapy - Scalp Procedure discussed: discussed risks, benefits, side effects. and alternatives   Prep: site scrubbed/prepped with acetone   Location:  Scalp Number of lesions:  Multiple Type of treatment:  Blue light Aminolevulinic Acid (see MAR for details): Levulan Number of Levulan sticks used:  1 Incubation time (minutes):  120 Number of minutes under lamp:  16 Number of seconds under lamp:  40 Cooling:  Floor fan Outcome: patient tolerated procedure well with no complications   Post-procedure details: sunscreen applied    Aminolevulinic Acid HCl 20 % SOLR 354 mg - Scalp   Related Medications Fluorouracil 5 % SOLN Apply 1 application topically daily. X 2 weeks to scalp. To start August 13, 2019

## 2021-01-25 NOTE — Patient Instructions (Signed)

## 2021-02-15 DIAGNOSIS — M8589 Other specified disorders of bone density and structure, multiple sites: Secondary | ICD-10-CM | POA: Diagnosis not present

## 2021-02-15 DIAGNOSIS — E10649 Type 1 diabetes mellitus with hypoglycemia without coma: Secondary | ICD-10-CM | POA: Diagnosis not present

## 2021-02-15 DIAGNOSIS — E034 Atrophy of thyroid (acquired): Secondary | ICD-10-CM | POA: Diagnosis not present

## 2021-02-16 DIAGNOSIS — E10649 Type 1 diabetes mellitus with hypoglycemia without coma: Secondary | ICD-10-CM | POA: Diagnosis not present

## 2021-03-07 DIAGNOSIS — E10649 Type 1 diabetes mellitus with hypoglycemia without coma: Secondary | ICD-10-CM | POA: Diagnosis not present

## 2021-03-16 DIAGNOSIS — J0100 Acute maxillary sinusitis, unspecified: Secondary | ICD-10-CM | POA: Diagnosis not present

## 2021-03-16 DIAGNOSIS — Z03818 Encounter for observation for suspected exposure to other biological agents ruled out: Secondary | ICD-10-CM | POA: Diagnosis not present

## 2021-03-27 DIAGNOSIS — E113293 Type 2 diabetes mellitus with mild nonproliferative diabetic retinopathy without macular edema, bilateral: Secondary | ICD-10-CM | POA: Diagnosis not present

## 2021-05-08 DIAGNOSIS — E10649 Type 1 diabetes mellitus with hypoglycemia without coma: Secondary | ICD-10-CM | POA: Diagnosis not present

## 2021-06-22 ENCOUNTER — Ambulatory Visit: Payer: Medicare HMO | Admitting: Dermatology

## 2021-06-22 DIAGNOSIS — L578 Other skin changes due to chronic exposure to nonionizing radiation: Secondary | ICD-10-CM

## 2021-06-22 DIAGNOSIS — L821 Other seborrheic keratosis: Secondary | ICD-10-CM | POA: Diagnosis not present

## 2021-06-22 DIAGNOSIS — L570 Actinic keratosis: Secondary | ICD-10-CM

## 2021-06-22 DIAGNOSIS — L820 Inflamed seborrheic keratosis: Secondary | ICD-10-CM

## 2021-06-22 NOTE — Progress Notes (Signed)
? ?Follow-Up Visit ?  ?Subjective  ?Logan Strong is a 88 y.o. male who presents for the following: Actinic Keratosis (6 month ak follow up. Hx of pdt at scalp. //). ?The patient has spots, moles and lesions to be evaluated, some may be new or changing and the patient has concerns that these could be cancer. ? ?The following portions of the chart were reviewed this encounter and updated as appropriate:  Tobacco  Allergies  Meds  Problems  Med Hx  Surg Hx  Fam Hx   ?  ?Review of Systems: No other skin or systemic complaints except as noted in HPI or Assessment and Plan. ? ?Objective  ?Well appearing patient in no apparent distress; mood and affect are within normal limits. ? ?A focused examination was performed including face and scalp. Relevant physical exam findings are noted in the Assessment and Plan. ? ?forehead x 2, right temple x 1 (3) ?Erythematous thin papules/macules with gritty scale.  ? ?right temple x 2 (2) ?Erythematous stuck-on, waxy papule or plaque ? ? ?Assessment & Plan  ?Actinic keratosis (3) ?forehead x 2, right temple x 1 ?Actinic keratoses are precancerous spots that appear secondary to cumulative UV radiation exposure/sun exposure over time. They are chronic with expected duration over 1 year. A portion of actinic keratoses will progress to squamous cell carcinoma of the skin. It is not possible to reliably predict which spots will progress to skin cancer and so treatment is recommended to prevent development of skin cancer. ? ?Recommend daily broad spectrum sunscreen SPF 30+ to sun-exposed areas, reapply every 2 hours as needed.  ?Recommend staying in the shade or wearing long sleeves, sun glasses (UVA+UVB protection) and wide brim hats (4-inch brim around the entire circumference of the hat). ?Call for new or changing lesions. ? ?Destruction of lesion - forehead x 2, right temple x 1 ?Complexity: simple   ?Destruction method: cryotherapy   ?Informed consent: discussed and consent  obtained   ?Timeout:  patient name, date of birth, surgical site, and procedure verified ?Lesion destroyed using liquid nitrogen: Yes   ?Region frozen until ice ball extended beyond lesion: Yes   ?Outcome: patient tolerated procedure well with no complications   ?Post-procedure details: wound care instructions given   ?Additional details:  Prior to procedure, discussed risks of blister formation, small wound, skin dyspigmentation, or rare scar following cryotherapy. Recommend Vaseline ointment to treated areas while healing. ? ?Inflamed seborrheic keratosis (2) ?right temple x 2 ?Irritated and bothers patient  ? ?Destruction of lesion - right temple x 2 ?Complexity: simple   ?Destruction method: cryotherapy   ?Informed consent: discussed and consent obtained   ?Timeout:  patient name, date of birth, surgical site, and procedure verified ?Lesion destroyed using liquid nitrogen: Yes   ?Region frozen until ice ball extended beyond lesion: Yes   ?Outcome: patient tolerated procedure well with no complications   ?Post-procedure details: wound care instructions given   ?Additional details:  Prior to procedure, discussed risks of blister formation, small wound, skin dyspigmentation, or rare scar following cryotherapy. Recommend Vaseline ointment to treated areas while healing. ? ?Seborrheic Keratoses ?- Stuck-on, waxy, tan-brown papules and/or plaques  ?- Benign-appearing ?- Discussed benign etiology and prognosis. ?- Observe ?- Call for any changes ? ?Actinic Damage ?- chronic, secondary to cumulative UV radiation exposure/sun exposure over time ?- diffuse scaly erythematous macules with underlying dyspigmentation ?- Recommend daily broad spectrum sunscreen SPF 30+ to sun-exposed areas, reapply every 2 hours as needed.  ?-  Recommend staying in the shade or wearing long sleeves, sun glasses (UVA+UVB protection) and wide brim hats (4-inch brim around the entire circumference of the hat). ?- Call for new or changing  lesions. ? ?Return in about 6 months (around 12/22/2021) for ak followup. ? ?I, Asher Muir, CMA, am acting as scribe for Armida Sans, MD. ?Documentation: I have reviewed the above documentation for accuracy and completeness, and I agree with the above. ? ?Armida Sans, MD ? ?

## 2021-06-22 NOTE — Patient Instructions (Addendum)
Cryotherapy Aftercare ? ?Wash gently with soap and water everyday.   ?Apply Vaseline and Band-Aid daily until healed.  ? ? ?Actinic keratoses are precancerous spots that appear secondary to cumulative UV radiation exposure/sun exposure over time. They are chronic with expected duration over 1 year. A portion of actinic keratoses will progress to squamous cell carcinoma of the skin. It is not possible to reliably predict which spots will progress to skin cancer and so treatment is recommended to prevent development of skin cancer. ? ?Recommend daily broad spectrum sunscreen SPF 30+ to sun-exposed areas, reapply every 2 hours as needed.  ?Recommend staying in the shade or wearing long sleeves, sun glasses (UVA+UVB protection) and wide brim hats (4-inch brim around the entire circumference of the hat). ?Call for new or changing lesions.  ? ?Seborrheic Keratosis ? ?What causes seborrheic keratoses? ?Seborrheic keratoses are harmless, common skin growths that first appear during adult life.  As time goes by, more growths appear.  Some people may develop a large number of them.  Seborrheic keratoses appear on both covered and uncovered body parts.  They are not caused by sunlight.  The tendency to develop seborrheic keratoses can be inherited.  They vary in color from skin-colored to gray, brown, or even black.  They can be either smooth or have a rough, warty surface.   ?Seborrheic keratoses are superficial and look as if they were stuck on the skin.  Under the microscope this type of keratosis looks like layers upon layers of skin.  That is why at times the top layer may seem to fall off, but the rest of the growth remains and re-grows.   ? ?Treatment ?Seborrheic keratoses do not need to be treated, but can easily be removed in the office.  Seborrheic keratoses often cause symptoms when they rub on clothing or jewelry.  Lesions can be in the way of shaving.  If they become inflamed, they can cause itching, soreness, or  burning.  Removal of a seborrheic keratosis can be accomplished by freezing, burning, or surgery. ?If any spot bleeds, scabs, or grows rapidly, please return to have it checked, as these can be an indication of a skin cancer. ? ? ? ? ? ? ?If You Need Anything After Your Visit ? ?If you have any questions or concerns for your doctor, please call our main line at [phone] and press option 4 to reach your doctor's medical assistant. If no one answers, please leave a voicemail as directed and we will return your call as soon as possible. Messages left after 4 pm will be answered the following business day.  ? ?You may also send [place] a message via MyChart. We typically respond to MyChart messages within 1-2 business days. ? ?For prescription refills, please ask your pharmacy to contact our office. Our fax number is [phone]. ? ?If you have an urgent issue when the clinic is closed that cannot wait until the next business day, you can page your doctor at the number below.   ? ?Please note that while we do our best to be available for urgent issues outside of office hours, we are not available 24/7.  ? ?If you have an urgent issue and are unable to reach [place], you may choose to seek medical care at your doctor's office, retail clinic, urgent care center, or emergency room. ? ?If you have a medical emergency, please immediately call 911 or go to the emergency department. ? ?Pager Numbers ? ?- Dr. Gwen Pounds: [phone] ? ?-  Dr. Laurence Ferrari: [phone] ? ?- Dr. Nicole Kindred: [phone] ? ?In the event of inclement weather, please call our main line at [phone] for an update on the status of any delays or closures. ? ?Dermatology Medication Tips: ?Please keep the boxes that topical medications come in in order to help keep track of the instructions about where and how to use these. Pharmacies typically print the medication instructions only on the boxes and not directly on the medication tubes.  ? ?If your medication is too  expensive, please contact our office at [phone] option 4 or send [place] a message through [place].  ? ?We are unable to tell what your co-pay for medications will be in advance as this is different depending on your insurance coverage. However, we may be able to find a substitute medication at lower cost or fill out paperwork to get insurance to cover a needed medication.  ? ?If a prior authorization is required to get your medication covered by your insurance company, please allow [place] 1-2 business days to complete this process. ? ?Drug prices often vary depending on where the prescription is filled and some pharmacies may offer cheaper prices. ? ?The website www.goodrx.com contains coupons for medications through different pharmacies. The prices here do not account for what the cost may be with help from insurance (it may be cheaper with your insurance), but the website can give you the price if you did not use any insurance.  ?- You can print the associated coupon and take it with your prescription to the pharmacy.  ?- You may also stop by our office during regular business hours and pick up a GoodRx coupon card.  ?- If you need your prescription sent electronically to a different pharmacy, notify our office through [facility] or by phone at [phone] option 4. ? ? ? ? ?Si Usted Necesita Algo Despu?s de Su Visita ? ?Tambi?n puede enviarnos un mensaje a trav?s de MyChart. Por lo general respondemos a los mensajes de MyChart en el transcurso de 1 a 2 d?as h?biles. ? ?Para renovar recetas, por favor pida a su farmacia que se ponga en contacto con nuestra oficina. Nuestro n?mero de fax es el [phone]. ? ?Si tiene un asunto urgente cuando la cl?nica est? cerrada y que no puede esperar hasta el siguiente d?a h?bil, puede llamar/localizar a su doctor(a) al n?mero que aparece a continuaci?n.  ? ?Por favor, tenga en cuenta que aunque hacemos todo lo posible para estar disponibles para asuntos urgentes fuera  del horario de oficina, no estamos disponibles las 24 horas del d?a, los 7 d?as de la semana.  ? ?Si tiene un problema urgente y no puede comunicarse con nosotros, puede optar por buscar atenci?n m?dica  en el consultorio de su doctor(a), en una cl?nica privada, en un centro de atenci?n urgente o en una sala de emergencias. ? ?Si tiene Engineer, maintenance (IT) m?dica, por favor llame inmediatamente al 911 o vaya a la sala de emergencias. ? ?N?meros de b?per ? ?- Dr. Nehemiah Massed: [phone] ? ?- Dra. Moye: [phone] ? ?- Dra. Nicole Kindred: [phone] ? ?En caso de inclemencias del tiempo, por favor llame a nuestra l?nea principal al [phone] para una actualizaci?n sobre el estado de cualquier retraso o cierre. ? ?Consejos para la medicaci?n en dermatolog?a: ?Por favor, guarde las cajas en las que vienen los medicamentos de uso t?pico para ayudarle a seguir las instrucciones sobre d?nde y c?mo usarlos. Las farmacias generalmente imprimen las instrucciones del medicamento s?lo en las cajas y  no directamente en los tubos del medicamento.  ? ?Si su medicamento es muy caro, por favor, p?ngase en contacto con Zigmund Daniel llamando al [phone] y presione la opci?n 4 o env?enos un mensaje a trav?s de MyChart.  ? ?No podemos decirle cu?l ser? su copago por los medicamentos por adelantado ya que esto es diferente dependiendo de la cobertura de su seguro. Sin embargo, es posible que podamos encontrar un medicamento sustituto a Electrical engineer un formulario para que el seguro cubra el medicamento que se considera necesario.  ? ?Si se requiere Ardelia Mems autorizaci?n previa para que su compa??a de seguros [place] su medicamento, por favor perm?tanos de 1 a 2 d?as h?biles para completar este proceso. ? ?Los precios de los medicamentos var?an con frecuencia dependiendo del Environmental consultant de d?nde se surte la receta y alguna farmacias pueden ofrecer precios m?s baratos. ? ?El sitio web www.goodrx.com tiene cupones para medicamentos de Office manager. Los precios aqu? no tienen en cuenta lo que podr?a costar con la ayuda del seguro (puede ser m?s barato con su seguro), pero el sitio web puede darle el precio si no utiliz? ning?n seguro.

## 2021-06-27 ENCOUNTER — Encounter: Payer: Self-pay | Admitting: Dermatology

## 2021-07-12 DIAGNOSIS — Z950 Presence of cardiac pacemaker: Secondary | ICD-10-CM | POA: Diagnosis not present

## 2021-07-12 DIAGNOSIS — R5382 Chronic fatigue, unspecified: Secondary | ICD-10-CM | POA: Diagnosis not present

## 2021-07-12 DIAGNOSIS — E1022 Type 1 diabetes mellitus with diabetic chronic kidney disease: Secondary | ICD-10-CM | POA: Diagnosis not present

## 2021-07-12 DIAGNOSIS — E782 Mixed hyperlipidemia: Secondary | ICD-10-CM | POA: Diagnosis not present

## 2021-07-12 DIAGNOSIS — I700 Atherosclerosis of aorta: Secondary | ICD-10-CM | POA: Diagnosis not present

## 2021-07-12 DIAGNOSIS — R630 Anorexia: Secondary | ICD-10-CM | POA: Diagnosis not present

## 2021-07-12 DIAGNOSIS — I442 Atrioventricular block, complete: Secondary | ICD-10-CM | POA: Diagnosis not present

## 2021-07-12 DIAGNOSIS — I2510 Atherosclerotic heart disease of native coronary artery without angina pectoris: Secondary | ICD-10-CM | POA: Diagnosis not present

## 2021-07-12 DIAGNOSIS — G4733 Obstructive sleep apnea (adult) (pediatric): Secondary | ICD-10-CM | POA: Diagnosis not present

## 2021-07-12 DIAGNOSIS — I495 Sick sinus syndrome: Secondary | ICD-10-CM | POA: Diagnosis not present

## 2021-07-19 DIAGNOSIS — M8589 Other specified disorders of bone density and structure, multiple sites: Secondary | ICD-10-CM | POA: Diagnosis not present

## 2021-07-19 DIAGNOSIS — E10649 Type 1 diabetes mellitus with hypoglycemia without coma: Secondary | ICD-10-CM | POA: Diagnosis not present

## 2021-07-19 DIAGNOSIS — E034 Atrophy of thyroid (acquired): Secondary | ICD-10-CM | POA: Diagnosis not present

## 2021-07-24 DIAGNOSIS — G4733 Obstructive sleep apnea (adult) (pediatric): Secondary | ICD-10-CM | POA: Diagnosis not present

## 2021-07-24 DIAGNOSIS — I2510 Atherosclerotic heart disease of native coronary artery without angina pectoris: Secondary | ICD-10-CM | POA: Diagnosis not present

## 2021-07-24 DIAGNOSIS — I10 Essential (primary) hypertension: Secondary | ICD-10-CM | POA: Diagnosis not present

## 2021-07-24 DIAGNOSIS — Z636 Dependent relative needing care at home: Secondary | ICD-10-CM | POA: Diagnosis not present

## 2021-07-24 DIAGNOSIS — R972 Elevated prostate specific antigen [PSA]: Secondary | ICD-10-CM | POA: Diagnosis not present

## 2021-07-24 DIAGNOSIS — M159 Polyosteoarthritis, unspecified: Secondary | ICD-10-CM | POA: Diagnosis not present

## 2021-07-24 DIAGNOSIS — N1831 Chronic kidney disease, stage 3a: Secondary | ICD-10-CM | POA: Diagnosis not present

## 2021-07-24 DIAGNOSIS — D649 Anemia, unspecified: Secondary | ICD-10-CM | POA: Diagnosis not present

## 2021-07-24 DIAGNOSIS — Z9641 Presence of insulin pump (external) (internal): Secondary | ICD-10-CM | POA: Diagnosis not present

## 2021-07-27 DIAGNOSIS — E10649 Type 1 diabetes mellitus with hypoglycemia without coma: Secondary | ICD-10-CM | POA: Diagnosis not present

## 2021-07-31 DIAGNOSIS — Z87891 Personal history of nicotine dependence: Secondary | ICD-10-CM | POA: Diagnosis not present

## 2021-07-31 DIAGNOSIS — D631 Anemia in chronic kidney disease: Secondary | ICD-10-CM | POA: Diagnosis not present

## 2021-07-31 DIAGNOSIS — Z0000 Encounter for general adult medical examination without abnormal findings: Secondary | ICD-10-CM | POA: Diagnosis not present

## 2021-07-31 DIAGNOSIS — N183 Chronic kidney disease, stage 3 unspecified: Secondary | ICD-10-CM | POA: Diagnosis not present

## 2021-07-31 DIAGNOSIS — M25551 Pain in right hip: Secondary | ICD-10-CM | POA: Diagnosis not present

## 2021-07-31 DIAGNOSIS — J019 Acute sinusitis, unspecified: Secondary | ICD-10-CM | POA: Diagnosis not present

## 2021-07-31 DIAGNOSIS — I129 Hypertensive chronic kidney disease with stage 1 through stage 4 chronic kidney disease, or unspecified chronic kidney disease: Secondary | ICD-10-CM | POA: Diagnosis not present

## 2021-07-31 DIAGNOSIS — M25511 Pain in right shoulder: Secondary | ICD-10-CM | POA: Diagnosis not present

## 2021-07-31 DIAGNOSIS — E1122 Type 2 diabetes mellitus with diabetic chronic kidney disease: Secondary | ICD-10-CM | POA: Diagnosis not present

## 2021-08-23 DIAGNOSIS — Z950 Presence of cardiac pacemaker: Secondary | ICD-10-CM | POA: Diagnosis not present

## 2021-08-23 DIAGNOSIS — R531 Weakness: Secondary | ICD-10-CM | POA: Diagnosis not present

## 2021-08-23 DIAGNOSIS — R001 Bradycardia, unspecified: Secondary | ICD-10-CM | POA: Diagnosis not present

## 2021-08-23 DIAGNOSIS — E782 Mixed hyperlipidemia: Secondary | ICD-10-CM | POA: Diagnosis not present

## 2021-08-23 DIAGNOSIS — I495 Sick sinus syndrome: Secondary | ICD-10-CM | POA: Diagnosis not present

## 2021-08-23 DIAGNOSIS — Z79899 Other long term (current) drug therapy: Secondary | ICD-10-CM | POA: Diagnosis not present

## 2021-08-23 DIAGNOSIS — E1065 Type 1 diabetes mellitus with hyperglycemia: Secondary | ICD-10-CM | POA: Diagnosis not present

## 2021-08-23 DIAGNOSIS — E7800 Pure hypercholesterolemia, unspecified: Secondary | ICD-10-CM | POA: Diagnosis not present

## 2021-08-23 DIAGNOSIS — E538 Deficiency of other specified B group vitamins: Secondary | ICD-10-CM | POA: Diagnosis not present

## 2021-08-28 DIAGNOSIS — G8929 Other chronic pain: Secondary | ICD-10-CM | POA: Diagnosis not present

## 2021-08-28 DIAGNOSIS — M47812 Spondylosis without myelopathy or radiculopathy, cervical region: Secondary | ICD-10-CM | POA: Diagnosis not present

## 2021-08-28 DIAGNOSIS — M50322 Other cervical disc degeneration at C5-C6 level: Secondary | ICD-10-CM | POA: Diagnosis not present

## 2021-08-28 DIAGNOSIS — M50323 Other cervical disc degeneration at C6-C7 level: Secondary | ICD-10-CM | POA: Diagnosis not present

## 2021-08-28 DIAGNOSIS — M542 Cervicalgia: Secondary | ICD-10-CM | POA: Diagnosis not present

## 2021-08-28 DIAGNOSIS — M5030 Other cervical disc degeneration, unspecified cervical region: Secondary | ICD-10-CM | POA: Diagnosis not present

## 2021-09-01 DIAGNOSIS — M542 Cervicalgia: Secondary | ICD-10-CM | POA: Diagnosis not present

## 2021-09-05 DIAGNOSIS — M542 Cervicalgia: Secondary | ICD-10-CM | POA: Diagnosis not present

## 2021-09-07 DIAGNOSIS — M542 Cervicalgia: Secondary | ICD-10-CM | POA: Diagnosis not present

## 2021-09-11 DIAGNOSIS — M542 Cervicalgia: Secondary | ICD-10-CM | POA: Diagnosis not present

## 2021-09-14 DIAGNOSIS — M542 Cervicalgia: Secondary | ICD-10-CM | POA: Diagnosis not present

## 2021-09-18 DIAGNOSIS — M542 Cervicalgia: Secondary | ICD-10-CM | POA: Diagnosis not present

## 2021-09-20 DIAGNOSIS — M542 Cervicalgia: Secondary | ICD-10-CM | POA: Diagnosis not present

## 2021-09-21 DIAGNOSIS — Z950 Presence of cardiac pacemaker: Secondary | ICD-10-CM | POA: Diagnosis not present

## 2021-09-21 DIAGNOSIS — I700 Atherosclerosis of aorta: Secondary | ICD-10-CM | POA: Diagnosis not present

## 2021-09-21 DIAGNOSIS — I2510 Atherosclerotic heart disease of native coronary artery without angina pectoris: Secondary | ICD-10-CM | POA: Diagnosis not present

## 2021-09-21 DIAGNOSIS — G4733 Obstructive sleep apnea (adult) (pediatric): Secondary | ICD-10-CM | POA: Diagnosis not present

## 2021-09-21 DIAGNOSIS — I495 Sick sinus syndrome: Secondary | ICD-10-CM | POA: Diagnosis not present

## 2021-09-21 DIAGNOSIS — R001 Bradycardia, unspecified: Secondary | ICD-10-CM | POA: Diagnosis not present

## 2021-09-21 DIAGNOSIS — E782 Mixed hyperlipidemia: Secondary | ICD-10-CM | POA: Diagnosis not present

## 2021-09-25 DIAGNOSIS — M542 Cervicalgia: Secondary | ICD-10-CM | POA: Diagnosis not present

## 2021-09-26 DIAGNOSIS — H401131 Primary open-angle glaucoma, bilateral, mild stage: Secondary | ICD-10-CM | POA: Diagnosis not present

## 2021-09-26 DIAGNOSIS — Z961 Presence of intraocular lens: Secondary | ICD-10-CM | POA: Diagnosis not present

## 2021-09-26 DIAGNOSIS — E113293 Type 2 diabetes mellitus with mild nonproliferative diabetic retinopathy without macular edema, bilateral: Secondary | ICD-10-CM | POA: Diagnosis not present

## 2021-09-26 DIAGNOSIS — Z0100 Encounter for examination of eyes and vision without abnormal findings: Secondary | ICD-10-CM | POA: Diagnosis not present

## 2021-09-27 DIAGNOSIS — M542 Cervicalgia: Secondary | ICD-10-CM | POA: Diagnosis not present

## 2021-10-02 DIAGNOSIS — M542 Cervicalgia: Secondary | ICD-10-CM | POA: Diagnosis not present

## 2021-10-02 DIAGNOSIS — M5030 Other cervical disc degeneration, unspecified cervical region: Secondary | ICD-10-CM | POA: Diagnosis not present

## 2021-10-02 DIAGNOSIS — M47812 Spondylosis without myelopathy or radiculopathy, cervical region: Secondary | ICD-10-CM | POA: Diagnosis not present

## 2021-10-02 DIAGNOSIS — M50323 Other cervical disc degeneration at C6-C7 level: Secondary | ICD-10-CM | POA: Diagnosis not present

## 2021-10-02 DIAGNOSIS — G8929 Other chronic pain: Secondary | ICD-10-CM | POA: Diagnosis not present

## 2021-10-02 DIAGNOSIS — M50322 Other cervical disc degeneration at C5-C6 level: Secondary | ICD-10-CM | POA: Diagnosis not present

## 2021-10-16 DIAGNOSIS — E10649 Type 1 diabetes mellitus with hypoglycemia without coma: Secondary | ICD-10-CM | POA: Diagnosis not present

## 2021-10-18 DIAGNOSIS — S2241XA Multiple fractures of ribs, right side, initial encounter for closed fracture: Secondary | ICD-10-CM | POA: Diagnosis not present

## 2021-10-18 DIAGNOSIS — S2242XA Multiple fractures of ribs, left side, initial encounter for closed fracture: Secondary | ICD-10-CM | POA: Diagnosis not present

## 2021-10-18 DIAGNOSIS — I700 Atherosclerosis of aorta: Secondary | ICD-10-CM | POA: Diagnosis not present

## 2021-10-18 DIAGNOSIS — R0781 Pleurodynia: Secondary | ICD-10-CM | POA: Diagnosis not present

## 2021-11-22 DIAGNOSIS — M8589 Other specified disorders of bone density and structure, multiple sites: Secondary | ICD-10-CM | POA: Diagnosis not present

## 2021-11-22 DIAGNOSIS — E10649 Type 1 diabetes mellitus with hypoglycemia without coma: Secondary | ICD-10-CM | POA: Diagnosis not present

## 2021-11-22 DIAGNOSIS — E034 Atrophy of thyroid (acquired): Secondary | ICD-10-CM | POA: Diagnosis not present

## 2021-12-25 ENCOUNTER — Ambulatory Visit: Payer: Medicare HMO | Admitting: Dermatology

## 2021-12-25 DIAGNOSIS — L570 Actinic keratosis: Secondary | ICD-10-CM

## 2021-12-25 DIAGNOSIS — L821 Other seborrheic keratosis: Secondary | ICD-10-CM

## 2021-12-25 DIAGNOSIS — L578 Other skin changes due to chronic exposure to nonionizing radiation: Secondary | ICD-10-CM

## 2021-12-25 NOTE — Progress Notes (Signed)
   Follow-Up Visit   Subjective  Logan Strong is a 88 y.o. male who presents for the following: Follow-up. Patient here today for 6 month AK follow up. Forehead and right temple treated at last visit with LN2, scalp treated with 5FU. The patient has spots, moles and lesions to be evaluated, some may be new or changing and the patient has concerns that these could be cancer.  The following portions of the chart were reviewed this encounter and updated as appropriate:   Tobacco  Allergies  Meds  Problems  Med Hx  Surg Hx  Fam Hx     Review of Systems:  No other skin or systemic complaints except as noted in HPI or Assessment and Plan.  Objective  Well appearing patient in no apparent distress; mood and affect are within normal limits.  A focused examination was performed including face, ears, scalp and arms. Relevant physical exam findings are noted in the Assessment and Plan.  Scalp x 3, R temple x 1 (4) Erythematous thin papules/macules with gritty scale.    Assessment & Plan  AK (actinic keratosis) (4) Scalp x 3, R temple x 1  Actinic keratoses are precancerous spots that appear secondary to cumulative UV radiation exposure/sun exposure over time. They are chronic with expected duration over 1 year. A portion of actinic keratoses will progress to squamous cell carcinoma of the skin. It is not possible to reliably predict which spots will progress to skin cancer and so treatment is recommended to prevent development of skin cancer.  Recommend daily broad spectrum sunscreen SPF 30+ to sun-exposed areas, reapply every 2 hours as needed.  Recommend staying in the shade or wearing long sleeves, sun glasses (UVA+UVB protection) and wide brim hats (4-inch brim around the entire circumference of the hat). Call for new or changing lesions.   Destruction of lesion - Scalp x 3, R temple x 1 Complexity: simple   Destruction method: cryotherapy   Informed consent: discussed and  consent obtained   Timeout:  patient name, date of birth, surgical site, and procedure verified Lesion destroyed using liquid nitrogen: Yes   Region frozen until ice ball extended beyond lesion: Yes   Outcome: patient tolerated procedure well with no complications   Post-procedure details: wound care instructions given    Related Medications Fluorouracil 5 % SOLN Apply 1 application topically daily. X 2 weeks to scalp. To start August 13, 2019  Actinic Damage - chronic, secondary to cumulative UV radiation exposure/sun exposure over time - diffuse scaly erythematous macules with underlying dyspigmentation - Recommend daily broad spectrum sunscreen SPF 30+ to sun-exposed areas, reapply every 2 hours as needed.  - Recommend staying in the shade or wearing long sleeves, sun glasses (UVA+UVB protection) and wide brim hats (4-inch brim around the entire circumference of the hat). - Call for new or changing lesions.  Seborrheic Keratoses - Stuck-on, waxy, tan-brown papules and/or plaques  - Benign-appearing - Discussed benign etiology and prognosis. - Observe - Call for any changes  Return in about 6 months (around 06/26/2022) for AK follow up.  Graciella Belton, RMA, am acting as scribe for Sarina Ser, MD . Documentation: I have reviewed the above documentation for accuracy and completeness, and I agree with the above.  Sarina Ser, MD

## 2021-12-25 NOTE — Patient Instructions (Signed)
Cryotherapy Aftercare  Wash gently with soap and water everyday.   Apply Vaseline and Band-Aid daily until healed.     Due to recent changes in healthcare laws, you may see results of your pathology and/or laboratory studies on MyChart before the doctors have had a chance to review them. We understand that in some cases there may be results that are confusing or concerning to you. Please understand that not all results are received at the same time and often the doctors may need to interpret multiple results in order to provide you with the best plan of care or course of treatment. Therefore, we ask that you please give us 2 business days to thoroughly review all your results before contacting the office for clarification. Should we see a critical lab result, you will be contacted sooner.   If You Need Anything After Your Visit  If you have any questions or concerns for your doctor, please call our main line at [phone] and press option 4 to reach your doctor's medical assistant. If no one answers, please leave a voicemail as directed and we will return your call as soon as possible. Messages left after 4 pm will be answered the following business day.   You may also send us a message via MyChart. We typically respond to MyChart messages within 1-2 business days.  For prescription refills, please ask your pharmacy to contact our office. Our fax number is [phone].  If you have an urgent issue when the clinic is closed that cannot wait until the next business day, you can page your doctor at the number below.    Please note that while we do our best to be available for urgent issues outside of office hours, we are not available 24/7.   If you have an urgent issue and are unable to reach us, you may choose to seek medical care at your doctor's office, retail clinic, urgent care center, or emergency room.  If you have a medical emergency, please immediately call 911 or go to the  emergency department.  Pager Numbers  - Dr. Kowalski: [phone]  - Dr. Moye: [phone]  - Dr. Stewart: [phone]  In the event of inclement weather, please call our main line at [phone] for an update on the status of any delays or closures.  Dermatology Medication Tips: Please keep the boxes that topical medications come in in order to help keep track of the instructions about where and how to use these. Pharmacies typically print the medication instructions only on the boxes and not directly on the medication tubes.   If your medication is too expensive, please contact our office at [phone] option 4 or send us a message through MyChart.   We are unable to tell what your co-pay for medications will be in advance as this is different depending on your insurance coverage. However, we may be able to find a substitute medication at lower cost or fill out paperwork to get insurance to cover a needed medication.   If a prior authorization is required to get your medication covered by your insurance company, please allow us 1-2 business days to complete this process.  Drug prices often vary depending on where the prescription is filled and some pharmacies may offer cheaper prices.  The website www.goodrx.com contains coupons for medications through different pharmacies. The prices here do not account for what the cost may be with help from insurance (it may be cheaper with your insurance), but the website can   give you the price if you did not use any insurance.  - You can print the associated coupon and take it with your prescription to the pharmacy.  - You may also stop by our office during regular business hours and pick up a GoodRx coupon card.  - If you need your prescription sent electronically to a different pharmacy, notify our office through [place] MyChart or by phone at [phone] option 4.     Si Usted Necesita Algo Despus de Su Visita  Tambin puede  enviarnos un mensaje a travs de MyChart. Por lo general respondemos a los mensajes de MyChart en el transcurso de 1 a 2 das hbiles.  Para renovar recetas, por favor pida a su farmacia que se ponga en contacto con nuestra oficina. Nuestro nmero de fax es el [phone].  Si tiene un asunto urgente cuando la clnica est cerrada y que no puede esperar hasta el siguiente da hbil, puede llamar/localizar a su doctor(a) al nmero que aparece a continuacin.   Por favor, tenga en cuenta que aunque hacemos todo lo posible para estar disponibles para asuntos urgentes fuera del horario de oficina, no estamos disponibles las 24 horas del da, los 7 das de la semana.   Si tiene un problema urgente y no puede comunicarse con nosotros, puede optar por buscar atencin mdica  en el consultorio de su doctor(a), en una clnica privada, en un centro de atencin urgente o en una sala de emergencias.  Si tiene una emergencia mdica, por favor llame inmediatamente al 911 o vaya a la sala de emergencias.  Nmeros de bper  - Dr. Kowalski: [phone]  - Dra. Moye: [phone]  - Dra. Stewart: [phone]  En caso de inclemencias del tiempo, por favor llame a nuestra lnea principal al [phone] para una actualizacin sobre el estado de cualquier retraso o cierre.  Consejos para la medicacin en dermatologa: Por favor, guarde las cajas en las que vienen los medicamentos de uso tpico para ayudarle a seguir las instrucciones sobre dnde y cmo usarlos. Las farmacias generalmente imprimen las instrucciones del medicamento slo en las cajas y no directamente en los tubos del medicamento.   Si su medicamento es muy caro, por favor, pngase en contacto con nuestra oficina llamando al [phone] y presione la opcin 4 o envenos un mensaje a travs de MyChart.   No podemos decirle cul ser su copago por los medicamentos por adelantado ya que esto es diferente dependiendo de la cobertura de su seguro.  Sin embargo, es posible que podamos encontrar un medicamento sustituto a menor costo o llenar un formulario para que el seguro cubra el medicamento que se considera necesario.   Si se requiere una autorizacin previa para que su compaa de seguros cubra su medicamento, por favor permtanos de 1 a 2 das hbiles para completar este proceso.  Los precios de los medicamentos varan con frecuencia dependiendo del lugar de dnde se surte la receta y alguna farmacias pueden ofrecer precios ms baratos.  El sitio web www.goodrx.com tiene cupones para medicamentos de diferentes farmacias. Los precios aqu no tienen en cuenta lo que podra costar con la ayuda del seguro (puede ser ms barato con su seguro), pero el sitio web puede darle el precio si no utiliz ningn seguro.  - Puede imprimir el cupn correspondiente y llevarlo con su receta a la farmacia.  - Tambin puede pasar por nuestra oficina durante el horario de atencin regular y recoger una tarjeta de cupones de GoodRx.  -   Si necesita que su receta se enve electrnicamente a una farmacia diferente, informe a nuestra oficina a travs de MyChart de [place] o por telfono llamando al [phone] y presione la opcin 4.  

## 2021-12-26 DIAGNOSIS — I495 Sick sinus syndrome: Secondary | ICD-10-CM | POA: Diagnosis not present

## 2021-12-29 DIAGNOSIS — M50322 Other cervical disc degeneration at C5-C6 level: Secondary | ICD-10-CM | POA: Diagnosis not present

## 2021-12-29 DIAGNOSIS — M47812 Spondylosis without myelopathy or radiculopathy, cervical region: Secondary | ICD-10-CM | POA: Diagnosis not present

## 2021-12-29 DIAGNOSIS — M50323 Other cervical disc degeneration at C6-C7 level: Secondary | ICD-10-CM | POA: Diagnosis not present

## 2022-01-04 ENCOUNTER — Encounter: Payer: Self-pay | Admitting: Dermatology

## 2022-01-04 DIAGNOSIS — E10649 Type 1 diabetes mellitus with hypoglycemia without coma: Secondary | ICD-10-CM | POA: Diagnosis not present

## 2022-01-29 DIAGNOSIS — M810 Age-related osteoporosis without current pathological fracture: Secondary | ICD-10-CM | POA: Diagnosis not present

## 2022-02-11 ENCOUNTER — Emergency Department: Payer: Medicare HMO

## 2022-02-11 ENCOUNTER — Inpatient Hospital Stay
Admission: EM | Admit: 2022-02-11 | Discharge: 2022-02-13 | DRG: 919 | Disposition: A | Discharge status: Hospice | Payer: Medicare HMO | Attending: Internal Medicine | Admitting: Internal Medicine

## 2022-02-11 ENCOUNTER — Encounter: Payer: Self-pay | Admitting: Internal Medicine

## 2022-02-11 ENCOUNTER — Other Ambulatory Visit: Payer: Self-pay

## 2022-02-11 DIAGNOSIS — Z8249 Family history of ischemic heart disease and other diseases of the circulatory system: Secondary | ICD-10-CM

## 2022-02-11 DIAGNOSIS — N1831 Chronic kidney disease, stage 3a: Secondary | ICD-10-CM | POA: Diagnosis present

## 2022-02-11 DIAGNOSIS — D631 Anemia in chronic kidney disease: Secondary | ICD-10-CM | POA: Diagnosis not present

## 2022-02-11 DIAGNOSIS — R200 Anesthesia of skin: Secondary | ICD-10-CM | POA: Diagnosis present

## 2022-02-11 DIAGNOSIS — Z7983 Long term (current) use of bisphosphonates: Secondary | ICD-10-CM

## 2022-02-11 DIAGNOSIS — Z66 Do not resuscitate: Secondary | ICD-10-CM | POA: Diagnosis not present

## 2022-02-11 DIAGNOSIS — I495 Sick sinus syndrome: Secondary | ICD-10-CM | POA: Diagnosis not present

## 2022-02-11 DIAGNOSIS — Z7189 Other specified counseling: Secondary | ICD-10-CM

## 2022-02-11 DIAGNOSIS — I10 Essential (primary) hypertension: Secondary | ICD-10-CM | POA: Diagnosis not present

## 2022-02-11 DIAGNOSIS — I2510 Atherosclerotic heart disease of native coronary artery without angina pectoris: Secondary | ICD-10-CM | POA: Diagnosis not present

## 2022-02-11 DIAGNOSIS — D72829 Elevated white blood cell count, unspecified: Secondary | ICD-10-CM | POA: Diagnosis not present

## 2022-02-11 DIAGNOSIS — H409 Unspecified glaucoma: Secondary | ICD-10-CM | POA: Diagnosis present

## 2022-02-11 DIAGNOSIS — R197 Diarrhea, unspecified: Secondary | ICD-10-CM | POA: Diagnosis present

## 2022-02-11 DIAGNOSIS — N179 Acute kidney failure, unspecified: Secondary | ICD-10-CM | POA: Diagnosis not present

## 2022-02-11 DIAGNOSIS — E785 Hyperlipidemia, unspecified: Secondary | ICD-10-CM | POA: Diagnosis present

## 2022-02-11 DIAGNOSIS — E1010 Type 1 diabetes mellitus with ketoacidosis without coma: Secondary | ICD-10-CM | POA: Diagnosis not present

## 2022-02-11 DIAGNOSIS — R531 Weakness: Secondary | ICD-10-CM | POA: Diagnosis not present

## 2022-02-11 DIAGNOSIS — I130 Hypertensive heart and chronic kidney disease with heart failure and stage 1 through stage 4 chronic kidney disease, or unspecified chronic kidney disease: Secondary | ICD-10-CM | POA: Diagnosis not present

## 2022-02-11 DIAGNOSIS — E1022 Type 1 diabetes mellitus with diabetic chronic kidney disease: Secondary | ICD-10-CM | POA: Diagnosis present

## 2022-02-11 DIAGNOSIS — G4733 Obstructive sleep apnea (adult) (pediatric): Secondary | ICD-10-CM | POA: Diagnosis present

## 2022-02-11 DIAGNOSIS — R202 Paresthesia of skin: Secondary | ICD-10-CM | POA: Diagnosis present

## 2022-02-11 DIAGNOSIS — E1129 Type 2 diabetes mellitus with other diabetic kidney complication: Secondary | ICD-10-CM | POA: Diagnosis present

## 2022-02-11 DIAGNOSIS — I214 Non-ST elevation (NSTEMI) myocardial infarction: Secondary | ICD-10-CM | POA: Diagnosis present

## 2022-02-11 DIAGNOSIS — I5032 Chronic diastolic (congestive) heart failure: Secondary | ICD-10-CM | POA: Diagnosis present

## 2022-02-11 DIAGNOSIS — E1111 Type 2 diabetes mellitus with ketoacidosis with coma: Secondary | ICD-10-CM | POA: Diagnosis present

## 2022-02-11 DIAGNOSIS — Z7982 Long term (current) use of aspirin: Secondary | ICD-10-CM

## 2022-02-11 DIAGNOSIS — Z794 Long term (current) use of insulin: Secondary | ICD-10-CM | POA: Diagnosis not present

## 2022-02-11 DIAGNOSIS — M810 Age-related osteoporosis without current pathological fracture: Secondary | ICD-10-CM | POA: Diagnosis present

## 2022-02-11 DIAGNOSIS — F32A Depression, unspecified: Secondary | ICD-10-CM | POA: Diagnosis not present

## 2022-02-11 DIAGNOSIS — R9431 Abnormal electrocardiogram [ECG] [EKG]: Secondary | ICD-10-CM | POA: Diagnosis not present

## 2022-02-11 DIAGNOSIS — E860 Dehydration: Secondary | ICD-10-CM | POA: Diagnosis not present

## 2022-02-11 DIAGNOSIS — E1110 Type 2 diabetes mellitus with ketoacidosis without coma: Secondary | ICD-10-CM | POA: Diagnosis not present

## 2022-02-11 DIAGNOSIS — E875 Hyperkalemia: Secondary | ICD-10-CM | POA: Diagnosis not present

## 2022-02-11 DIAGNOSIS — Z634 Disappearance and death of family member: Secondary | ICD-10-CM

## 2022-02-11 DIAGNOSIS — T383X6A Underdosing of insulin and oral hypoglycemic [antidiabetic] drugs, initial encounter: Secondary | ICD-10-CM | POA: Diagnosis present

## 2022-02-11 DIAGNOSIS — E039 Hypothyroidism, unspecified: Secondary | ICD-10-CM | POA: Diagnosis not present

## 2022-02-11 DIAGNOSIS — Z955 Presence of coronary angioplasty implant and graft: Secondary | ICD-10-CM

## 2022-02-11 DIAGNOSIS — T85614A Breakdown (mechanical) of insulin pump, initial encounter: Secondary | ICD-10-CM | POA: Diagnosis not present

## 2022-02-11 DIAGNOSIS — Z950 Presence of cardiac pacemaker: Secondary | ICD-10-CM

## 2022-02-11 DIAGNOSIS — Z87891 Personal history of nicotine dependence: Secondary | ICD-10-CM

## 2022-02-11 DIAGNOSIS — R42 Dizziness and giddiness: Secondary | ICD-10-CM | POA: Diagnosis not present

## 2022-02-11 DIAGNOSIS — Z79899 Other long term (current) drug therapy: Secondary | ICD-10-CM

## 2022-02-11 DIAGNOSIS — R4182 Altered mental status, unspecified: Secondary | ICD-10-CM | POA: Diagnosis not present

## 2022-02-11 LAB — CBC
HCT: 37.8 % — ABNORMAL LOW (ref 39.0–52.0)
Hemoglobin: 12 g/dL — ABNORMAL LOW (ref 13.0–17.0)
MCH: 30 pg (ref 26.0–34.0)
MCHC: 31.7 g/dL (ref 30.0–36.0)
MCV: 94.5 fL (ref 80.0–100.0)
Platelets: 228 10*3/uL (ref 150–400)
RBC: 4 MIL/uL — ABNORMAL LOW (ref 4.22–5.81)
RDW: 13.3 % (ref 11.5–15.5)
WBC: 11.9 10*3/uL — ABNORMAL HIGH (ref 4.0–10.5)
nRBC: 0 % (ref 0.0–0.2)

## 2022-02-11 LAB — HEPATIC FUNCTION PANEL
ALT: 14 U/L (ref 0–44)
AST: 26 U/L (ref 15–41)
Albumin: 3.7 g/dL (ref 3.5–5.0)
Alkaline Phosphatase: 119 U/L (ref 38–126)
Bilirubin, Direct: 0.2 mg/dL (ref 0.0–0.2)
Indirect Bilirubin: 1.1 mg/dL — ABNORMAL HIGH (ref 0.3–0.9)
Total Bilirubin: 1.3 mg/dL — ABNORMAL HIGH (ref 0.3–1.2)
Total Protein: 7.1 g/dL (ref 6.5–8.1)

## 2022-02-11 LAB — GLUCOSE, CAPILLARY
Glucose-Capillary: 223 mg/dL — ABNORMAL HIGH (ref 70–99)
Glucose-Capillary: 290 mg/dL — ABNORMAL HIGH (ref 70–99)
Glucose-Capillary: 365 mg/dL — ABNORMAL HIGH (ref 70–99)
Glucose-Capillary: 382 mg/dL — ABNORMAL HIGH (ref 70–99)
Glucose-Capillary: 437 mg/dL — ABNORMAL HIGH (ref 70–99)
Glucose-Capillary: 466 mg/dL — ABNORMAL HIGH (ref 70–99)
Glucose-Capillary: 525 mg/dL (ref 70–99)
Glucose-Capillary: 550 mg/dL (ref 70–99)
Glucose-Capillary: 552 mg/dL (ref 70–99)
Glucose-Capillary: 565 mg/dL (ref 70–99)
Glucose-Capillary: >600 mg/dL (ref 70–99)
Glucose-Capillary: >600 mg/dL (ref 70–99)

## 2022-02-11 LAB — BASIC METABOLIC PANEL
Anion gap: 22 — ABNORMAL HIGH (ref 5–15)
Anion gap: 12 (ref 5–15)
Anion gap: 10 (ref 5–15)
BUN: 38 mg/dL — ABNORMAL HIGH (ref 8–23)
BUN: 38 mg/dL — ABNORMAL HIGH (ref 8–23)
BUN: 39 mg/dL — ABNORMAL HIGH (ref 8–23)
CO2: 14 mmol/L — ABNORMAL LOW (ref 22–32)
CO2: 20 mmol/L — ABNORMAL LOW (ref 22–32)
CO2: 23 mmol/L (ref 22–32)
Calcium: 9.5 mg/dL (ref 8.9–10.3)
Calcium: 8.8 mg/dL — ABNORMAL LOW (ref 8.9–10.3)
Calcium: 9 mg/dL (ref 8.9–10.3)
Chloride: 91 mmol/L — ABNORMAL LOW (ref 98–111)
Chloride: 100 mmol/L (ref 98–111)
Chloride: 102 mmol/L (ref 98–111)
Creatinine, Ser: 2.12 mg/dL — ABNORMAL HIGH (ref 0.61–1.24)
Creatinine, Ser: 2.07 mg/dL — ABNORMAL HIGH (ref 0.61–1.24)
Creatinine, Ser: 1.94 mg/dL — ABNORMAL HIGH (ref 0.61–1.24)
GFR, Estimated: 29 mL/min — ABNORMAL LOW (ref >60)
GFR, Estimated: 30 mL/min — ABNORMAL LOW (ref >60)
GFR, Estimated: 33 mL/min — ABNORMAL LOW (ref >60)
Glucose, Bld: 854 mg/dL (ref 70–99)
Glucose, Bld: 544 mg/dL (ref 70–99)
Glucose, Bld: 353 mg/dL — ABNORMAL HIGH (ref 70–99)
Potassium: 5.4 mmol/L — ABNORMAL HIGH (ref 3.5–5.1)
Potassium: 3.8 mmol/L (ref 3.5–5.1)
Potassium: 3.5 mmol/L (ref 3.5–5.1)
Sodium: 127 mmol/L — ABNORMAL LOW (ref 135–145)
Sodium: 132 mmol/L — ABNORMAL LOW (ref 135–145)
Sodium: 135 mmol/L (ref 135–145)

## 2022-02-11 LAB — URINALYSIS, ROUTINE W REFLEX MICROSCOPIC
Bacteria, UA: NONE SEEN
Bilirubin Urine: NEGATIVE
Glucose, UA: >=500 mg/dL — AB
Hgb urine dipstick: NEGATIVE
Ketones, ur: 20 mg/dL — AB
Leukocytes,Ua: NEGATIVE
Nitrite: NEGATIVE
Protein, ur: NEGATIVE mg/dL
Specific Gravity, Urine: 1.023 (ref 1.005–1.030)
Squamous Epithelial / HPF: NONE SEEN (ref 0–5)
pH: 5 (ref 5.0–8.0)

## 2022-02-11 LAB — MRSA NEXT GEN BY PCR, NASAL: MRSA by PCR Next Gen: NOT DETECTED

## 2022-02-11 LAB — CBG MONITORING, ED
Glucose-Capillary: >600 mg/dL (ref 70–99)
Glucose-Capillary: >600 mg/dL (ref 70–99)
Glucose-Capillary: >600 mg/dL (ref 70–99)
Glucose-Capillary: >600 mg/dL (ref 70–99)

## 2022-02-11 LAB — BLOOD GAS, VENOUS
Acid-base deficit: 12.7 mmol/L — ABNORMAL HIGH (ref 0.0–2.0)
Bicarbonate: 15.3 mmol/L — ABNORMAL LOW (ref 20.0–28.0)
O2 Saturation: 65.4 %
Patient temperature: 37
pCO2, Ven: 42 mmHg — ABNORMAL LOW (ref 44–60)
pH, Ven: 7.17 — CL (ref 7.25–7.43)
pO2, Ven: 43 mmHg (ref 32–45)

## 2022-02-11 LAB — BETA-HYDROXYBUTYRIC ACID: Beta-Hydroxybutyric Acid: 4.13 mmol/L — ABNORMAL HIGH (ref 0.05–0.27)

## 2022-02-11 LAB — APTT
aPTT: >200 seconds (ref 24–36)
aPTT: 105 seconds — ABNORMAL HIGH (ref 24–36)

## 2022-02-11 LAB — TROPONIN I (HIGH SENSITIVITY)
Troponin I (High Sensitivity): 128 ng/L (ref <18)
Troponin I (High Sensitivity): 296 ng/L (ref <18)
Troponin I (High Sensitivity): 12906 ng/L (ref <18)
Troponin I (High Sensitivity): 15479 ng/L (ref <18)
Troponin I (High Sensitivity): 13778 ng/L (ref <18)

## 2022-02-11 MED ORDER — HYDRALAZINE HCL 20 MG/ML IJ SOLN: 5.0000 mg | INTRAMUSCULAR | Status: DC | PRN

## 2022-02-11 MED ORDER — DEXTROSE 50 % IV SOLN: 0.0000 mL | INTRAVENOUS | Status: DC | PRN

## 2022-02-11 MED ORDER — LACTATED RINGERS IV SOLN: INTRAVENOUS | Status: DC

## 2022-02-11 MED ORDER — OMEGA-3-ACID ETHYL ESTERS 1 G PO CAPS
1.0000 g | ORAL_CAPSULE | Freq: Every day | ORAL | Status: DC
  Administered 2022-02-12 – 2022-02-13 (×2): 1 g via ORAL
  Filled 2022-02-11 (×3): qty 1

## 2022-02-11 MED ORDER — TIMOLOL MALEATE 0.5 % OP SOLN
1.0000 [drp] | Freq: Two times a day (BID) | OPHTHALMIC | Status: DC
  Administered 2022-02-11 – 2022-02-13 (×4): 1 [drp] via OPHTHALMIC
  Filled 2022-02-11 (×2): qty 5

## 2022-02-11 MED ORDER — ATORVASTATIN CALCIUM 20 MG PO TABS
40.0000 mg | ORAL_TABLET | Freq: Every day | ORAL | Status: DC
  Administered 2022-02-12 – 2022-02-13 (×2): 40 mg via ORAL
  Filled 2022-02-11 (×2): qty 2

## 2022-02-11 MED ORDER — DORZOLAMIDE HCL 2 % OP SOLN
1.0000 [drp] | Freq: Two times a day (BID) | OPHTHALMIC | Status: DC
  Administered 2022-02-11 – 2022-02-13 (×4): 1 [drp] via OPHTHALMIC
  Filled 2022-02-11: qty 10

## 2022-02-11 MED ORDER — ACETAMINOPHEN 325 MG PO TABS: 650.0000 mg | ORAL_TABLET | Freq: Four times a day (QID) | ORAL | Status: DC | PRN

## 2022-02-11 MED ORDER — NITROGLYCERIN 0.4 MG SL SUBL: 0.4000 mg | SUBLINGUAL_TABLET | SUBLINGUAL | Status: DC | PRN

## 2022-02-11 MED ORDER — HEPARIN (PORCINE) 25000 UT/250ML-% IV SOLN
900.0000 [IU]/h | INTRAVENOUS | Status: DC
  Administered 2022-02-11 – 2022-02-12 (×2): 700 [IU]/h via INTRAVENOUS
  Filled 2022-02-11 (×2): qty 250

## 2022-02-11 MED ORDER — SODIUM CHLORIDE 0.9 % IV BOLUS
1000.0000 mL | Freq: Once | INTRAVENOUS | Status: AC
  Administered 2022-02-11: 1000 mL via INTRAVENOUS

## 2022-02-11 MED ORDER — BRIMONIDINE TARTRATE 0.2 % OP SOLN
1.0000 [drp] | Freq: Two times a day (BID) | OPHTHALMIC | Status: DC
  Administered 2022-02-11 – 2022-02-13 (×4): 1 [drp] via OPHTHALMIC
  Filled 2022-02-11: qty 5

## 2022-02-11 MED ORDER — DEXTROSE IN LACTATED RINGERS 5 % IV SOLN: INTRAVENOUS | Status: DC

## 2022-02-11 MED ORDER — ONDANSETRON HCL 4 MG/2ML IJ SOLN: 4.0000 mg | Freq: Three times a day (TID) | INTRAMUSCULAR | Status: DC | PRN

## 2022-02-11 MED ORDER — ENOXAPARIN SODIUM 40 MG/0.4ML IJ SOSY
40.0000 mg | PREFILLED_SYRINGE | INTRAMUSCULAR | Status: DC
  Administered 2022-02-11: 40 mg via SUBCUTANEOUS
  Filled 2022-02-11: qty 0.4

## 2022-02-11 MED ORDER — ASPIRIN 81 MG PO TBEC: 81.0000 mg | DELAYED_RELEASE_TABLET | Freq: Every day | ORAL | Status: DC

## 2022-02-11 MED ORDER — ORAL CARE MOUTH RINSE: 15.0000 mL | OROMUCOSAL | Status: DC | PRN

## 2022-02-11 MED ORDER — INSULIN REGULAR(HUMAN) IN NACL 100-0.9 UT/100ML-% IV SOLN
INTRAVENOUS | Status: DC
  Administered 2022-02-11: 5.5 [IU]/h via INTRAVENOUS
  Administered 2022-02-12: 1.4 [IU]/h via INTRAVENOUS
  Filled 2022-02-11 (×2): qty 100

## 2022-02-11 MED ORDER — CITALOPRAM HYDROBROMIDE 20 MG PO TABS
20.0000 mg | ORAL_TABLET | Freq: Every day | ORAL | Status: DC
  Administered 2022-02-12 – 2022-02-13 (×2): 20 mg via ORAL
  Filled 2022-02-11 (×2): qty 1

## 2022-02-11 MED ORDER — OYSTER SHELL CALCIUM/D3 500-5 MG-MCG PO TABS
1.0000 | ORAL_TABLET | Freq: Every day | ORAL | Status: DC
  Administered 2022-02-11 – 2022-02-12 (×2): 1 via ORAL
  Filled 2022-02-11 (×2): qty 1

## 2022-02-11 MED ORDER — ASPIRIN 81 MG PO CHEW: 324.0000 mg | CHEWABLE_TABLET | Freq: Once | ORAL | Status: DC

## 2022-02-11 MED ORDER — LACTATED RINGERS IV BOLUS
20.0000 mL/kg | Freq: Once | INTRAVENOUS | Status: AC
  Administered 2022-02-11: 1180 mL via INTRAVENOUS

## 2022-02-11 MED ORDER — CHLORHEXIDINE GLUCONATE CLOTH 2 % EX PADS: 6.0000 | MEDICATED_PAD | Freq: Every day | CUTANEOUS | Status: DC

## 2022-02-11 MED ORDER — INSULIN REGULAR(HUMAN) IN NACL 100-0.9 UT/100ML-% IV SOLN: INTRAVENOUS | Status: DC

## 2022-02-11 MED ORDER — MORPHINE SULFATE (PF) 2 MG/ML IV SOLN: 1.0000 mg | INTRAVENOUS | Status: DC | PRN

## 2022-02-11 MED ORDER — TRAZODONE HCL 50 MG PO TABS
50.0000 mg | ORAL_TABLET | Freq: Every day | ORAL | Status: DC
  Administered 2022-02-11 – 2022-02-12 (×2): 50 mg via ORAL
  Filled 2022-02-11 (×2): qty 1

## 2022-02-11 MED ORDER — LATANOPROST 0.005 % OP SOLN
1.0000 [drp] | Freq: Every day | OPHTHALMIC | Status: DC
  Administered 2022-02-11 – 2022-02-12 (×3): 1 [drp] via OPHTHALMIC
  Filled 2022-02-11: qty 2.5

## 2022-02-11 MED ORDER — VITAMIN B-12 1000 MCG PO TABS
1000.0000 ug | ORAL_TABLET | Freq: Every day | ORAL | Status: DC
  Administered 2022-02-12 – 2022-02-13 (×2): 1000 ug via ORAL
  Filled 2022-02-11 (×2): qty 1

## 2022-02-11 NOTE — Inpatient Diabetes Management (Addendum)
Inpatient Diabetes Program Recommendations  AACE/ADA: New Consensus Statement on Inpatient Glycemic Control (2015)  Target Ranges:  Prepandial:   less than 140 mg/dL      Peak postprandial:   less than 180 mg/dL (1-2 hours)      Critically ill patients:  140 - 180 mg/dL    Latest Reference Range & Units 02/11/22 08:46  Sodium 135 - 145 mmol/L 127 (L)  Potassium 3.5 - 5.1 mmol/L 5.4 (H)  Chloride 98 - 111 mmol/L 91 (L)  CO2 22 - 32 mmol/L 14 (L)  Glucose 70 - 99 mg/dL 841 (HH)  BUN 8 - 23 mg/dL 38 (H)  Creatinine 3.24 - 1.24 mg/dL 4.01 (H)  Calcium 8.9 - 10.3 mg/dL 9.5  Anion gap 5 - 15  22 (H)  (HH): Data is critically high (L): Data is abnormally low (H): Data is abnormally high  Latest Reference Range & Units 02/11/22 11:55 02/11/22 13:11 02/11/22 13:53  Glucose-Capillary 70 - 99 mg/dL >027 (HH)  IV Insulin Dri pStarted at 1220 >600 (HH) >600 (HH)  (HH): Data is critically high Admit with: DKA  History: Type 1 diabetes  Home DM Meds: Insulin Pump with Novolog       Lantus 15 units  Current Orders: IV Insulin Drip    MD- Note IV Insulin Drip to start this AM  Please make sure pt removes Home Insulin Pump prior to starting the IV Insulin Drip  Will follow up w/ pt Monday AM 12/04  Addendum 10:52am--Dr. Clyde Lundborg asked Diabetes Team to leave recs for transition to SQ Insulin when pt is ready (Glucose stable, Anion Gap 12 or less, CO2 20 or higher).  When pt ready to transition, may consider 2 options:  Option A: If pt can have family bring fresh insulin pump (or if pt has pump supplies with him), we can have him transition back to his home insulin pump (if everything appears to be working correctly)--Pt would reapply pump and then IV Insulin can be stopped 2 hours after home pump restarted  Option B: If pt does not have pump supplies (hospital does not carry pump supplies), we can transition to the following SQ regimen when pt ready:  1. Start [name] 13 units Daily (make  sure to continue IV Insulin Drip for 2 hours after [name] insulin on board and then can d/c IV Insulin Drip)  2. Start Novolog 0-9 units (Sensitive scale) TID AC + HS  3. Start Novolog 4 units TID with meals for meal coverage (if pt allowed PO diet)    Endocrinologist: Dr. Gershon Crane with [facility] Last seen 11/22/2021 He is currently on Novolog via the T slim pump: Basal rates MN = 0.25 3:30 AM = 0.6 4 PM = 0.65 10:30 PM = 0.5 TDD basal: 13.35 units  Bolus settings I/C: 10 at MN, 12 at 4 PM ISF: 55 Target Glucose: 80 Active insulin time: 5 hours    --Will follow patient during hospitalization--  Ambrose Finland RN, MSN, CDCES Diabetes Coordinator Inpatient Glycemic Control Team Team Pager: [phone] (8a-5p)

## 2022-02-11 NOTE — ED Provider Notes (Signed)
[facility] Provider Note    Event Date/Time   First MD Initiated Contact with Patient 02/11/22 [phone]     (approximate)   History   Dizziness and Weakness   HPI  Logan Strong is a 88 y.o. male who presents to the ER for evaluation of weakness and dizziness started yesterday evening.  States he did have some tingling in his right hand not having any currently.  The tingling lasted quite some time.  Did have small headache yesterday.  Denies any headache denies any chest pain or pressure.  Does have a history of stent but denies any respiratory symptoms or chest discomfort.  Denies any abdominal pain no diarrhea no dysuria no fevers or chills.     Physical Exam   Triage Vital Signs: ED Triage Vitals  Enc Vitals Group     BP 02/11/22 0845 108/80     Pulse Rate 02/11/22 0845 94     Resp 02/11/22 0845 18     Temp 02/11/22 0845 98.5 F (36.9 C)     Temp Source 02/11/22 0845 Oral     SpO2 02/11/22 0845 99 %     Weight 02/11/22 0844 130 lb 1.1 oz (59 kg)     Height 02/11/22 0844 5\' 6"  (1.676 m)     Head Circumference --      Peak Flow --      Pain Score 02/11/22 0844 0     Pain Loc --      Pain Edu? --      Excl. in GC? --     Most recent vital signs: Vitals:   02/11/22 0845 02/11/22 0846  BP: 108/80   Pulse: 94   Resp: 18   Temp: 98.5 F (36.9 C) 97.8 F (36.6 C)  SpO2: 99%      Constitutional: Alert, frail appearing Eyes: Conjunctivae are normal.  Head: Atraumatic. Nose: No congestion/rhinnorhea. Mouth/Throat: Mucous membranes are moist.   Neck: Painless ROM.  Cardiovascular:   Good peripheral circulation. Respiratory: Normal respiratory effort.  No retractions.  Gastrointestinal: Soft and nontender.  Musculoskeletal:  no deformity Neurologic:  CN- intact.  No facial droop, Normal FNF.  Normal heel to shin.  Sensation intact bilaterally. Normal speech and language. No gross focal neurologic deficits are appreciated. No gait  instability.  Skin:  Skin is warm, dry and intact. No rash noted. Psychiatric: Mood and affect are normal. Speech and behavior are normal.    ED Results / Procedures / Treatments   Labs (all labs ordered are listed, but only abnormal results are displayed) Labs Reviewed  BASIC METABOLIC PANEL - Abnormal; Notable for the following components:      Result Value   Sodium 127 (*)    Potassium 5.4 (*)    Chloride 91 (*)    CO2 14 (*)    Glucose, Bld 854 (*)    BUN 38 (*)    Creatinine, Ser 2.12 (*)    GFR, Estimated 29 (*)    Anion gap 22 (*)    All other components within normal limits  CBC - Abnormal; Notable for the following components:   WBC 11.9 (*)    RBC 4.00 (*)    Hemoglobin 12.0 (*)    HCT 37.8 (*)    All other components within normal limits  URINALYSIS, ROUTINE W REFLEX MICROSCOPIC  HEPATIC FUNCTION PANEL  LACTIC ACID, PLASMA  LACTIC ACID, PLASMA  BASIC METABOLIC PANEL  BASIC METABOLIC PANEL  BASIC  METABOLIC PANEL  BASIC METABOLIC PANEL  BETA-HYDROXYBUTYRIC ACID  BETA-HYDROXYBUTYRIC ACID  CBC WITH DIFFERENTIAL/PLATELET  URINALYSIS, ROUTINE W REFLEX MICROSCOPIC  BLOOD GAS, VENOUS  CBG MONITORING, ED  TROPONIN I (HIGH SENSITIVITY)     EKG  ED ECG REPORT I, Willy Eddy, the attending physician, personally viewed and interpreted this ECG.   Date: 02/11/2022  EKG Time: 8:45  Rate: 90  Rhythm: sinus  Axis: normal  Intervals:normal  ST&T Change: nonspecific st abn    RADIOLOGY Please see ED Course for my review and interpretation.  I personally reviewed all radiographic images ordered to evaluate for the above acute complaints and reviewed radiology reports and findings.  These findings were personally discussed with the patient.  Please see medical record for radiology report.    PROCEDURES:  Critical Care performed: Yes, see critical care procedure note(s)  .Critical Care  Performed by: Willy Eddy, MD Authorized by: Willy Eddy, MD   Critical care provider statement:    Critical care time (minutes):  45   Critical care was necessary to treat or prevent imminent or life-threatening deterioration of the following conditions:  Endocrine crisis   Critical care was time spent personally by me on the following activities:  Ordering and performing treatments and interventions, ordering and review of laboratory studies, ordering and review of radiographic studies, pulse oximetry, re-evaluation of patient's condition, review of old charts, obtaining history from patient or surrogate, examination of patient, evaluation of patient's response to treatment, discussions with primary provider, discussions with consultants and development of treatment plan with patient or surrogate    MEDICATIONS ORDERED IN ED: Medications  insulin regular, human (MYXREDLIN) 100 units/ 100 mL infusion (has no administration in time range)  lactated ringers infusion (has no administration in time range)  dextrose 5 % in lactated ringers infusion (has no administration in time range)  dextrose 50 % solution 0-50 mL (has no administration in time range)  sodium chloride 0.9 % bolus 1,000 mL (has no administration in time range)  lactated ringers bolus 1,180 mL (1,180 mLs Intravenous New Bag/Given 02/11/22 0956)     IMPRESSION / MDM / ASSESSMENT AND PLAN / ED COURSE  I reviewed the triage vital signs and the nursing notes.                              Differential diagnosis includes, but is not limited to, dehydration, electrolyte abnormality, DKA, HHS, UTI, CVA, sepsis  Patient presenting to the ER for evaluation of symptoms as described above.  Based on symptoms, risk factors and considered above differential, this presenting complaint could reflect a potentially life-threatening illness therefore the patient will be placed on continuous pulse oximetry and telemetry for monitoring.  Laboratory evaluation will be sent to evaluate for the  above complaints.      Clinical Course as of 02/11/22 1015  Sun Feb 11, 2022  7494 Patient with significant hyperglycemia and metabolic acidosis with high anion gap concerning for DKA.  Patient is wearing his insulin pump.  Suspect malfunction.  We will give additional IV fluids.  Will initiate insulin drip.  He is hyperkalemic with potassium of 5.4. [PR]  0929 CT head on my review and interpretation does not show any evidence of SDH. [PR]  1013 Patient is hemodynamically stable receiving IV fluids as well as insulin drip.  I will consult hospitalist for admission. [PR]    Clinical Course User Index [PR] Willy Eddy,  MD      FINAL CLINICAL IMPRESSION(S) / ED DIAGNOSES   Final diagnoses:  Type 1 diabetes mellitus with ketoacidosis without coma (HCC)     Rx / DC Orders   ED Discharge Orders     None        Note:  This document was prepared using Dragon voice recognition software and may include unintentional dictation errors.    Willy Eddy, MD 02/11/22 [phone]

## 2022-02-11 NOTE — Consult Note (Signed)
CARDIOLOGY CONSULT NOTE               Patient ID: Logan Strong MRN: [id number] DOB/AGE: June 01, 1931 88 y.o.  Admit date: 02/11/2022 Referring Physician Dr Lorretta Harp hospitalist Primary Physician Dr. Marcello Fennel primary Primary Cardiologist [facility] Reason for Consultation elevated troponin consistent with a non-STEMI DKA  HPI: Patient is a 88 year old white male history of insulin-dependent diabetes with insulin pump hypertension hyperlipidemia known coronary disease history of PCI and stent chronic renal sufficiency sick sinus syndrome status post permanent pacemaker presented with generalized weakness fatigue lightheaded weakness dizziness had episode of right arm tingling with presumed weakness patient had intermittent diarrhea.  Patient admitted with DKA found to have elevated troponins and cardiology was consulted for further evaluation  Review of systems complete and found to be negative unless listed above     Past Medical History:  Diagnosis Date   Actinic keratosis    Anemia    Chronic kidney disease    "functioning at 50%"   Coronary artery disease    Diabetes mellitus, type 2 (HCC)    Dysrhythmia    Glaucoma    Hypothyroidism    Osteoporosis    Pneumonia    Sleep apnea    could not tolerate CPAP   Wears dentures    partial upper and lower    Past Surgical History:  Procedure Laterality Date   APPENDECTOMY     BACK SURGERY     lumbar   CARDIAC CATHETERIZATION  2006   stent x1   CATARACT EXTRACTION W/PHACO Right 12/19/2016   Procedure: CATARACT EXTRACTION PHACO AND INTRAOCULAR LENS PLACEMENT (IOC) RIGHT DIABETIC;  Surgeon: Lockie Mola, MD;  Location: [facility];  Service: Ophthalmology;  Laterality: Right;   HERNIA REPAIR     PACEMAKER INSERTION N/A 06/13/2017   Procedure: INSERTION PACEMAKER INITIAL DUAL CHAMBER INSERTION;  Surgeon: Marcina Millard, MD;  Location: [facility];  Service: Cardiovascular;  Laterality: N/A;   TRABECULECTOMY  Right 12/19/2016   Procedure: TRABECULECTOMY WITH [facility] AND EXPRESS SHUNT;  Surgeon: Lockie Mola, MD;  Location: [facility];  Service: Ophthalmology;  Laterality: Right;  Diabetic - insulin pump sleep apnea    (Not in a hospital admission)  Social History   Socioeconomic History   Marital status: Married    Spouse name: Not on file   Number of children: Not on file   Years of education: Not on file   Highest education level: Not on file  Occupational History   Occupation: retired  Tobacco Use   Smoking status: Former    Years: 30.00    Types: Cigarettes    Quit date: 1971    Years since quitting: 52.9   Smokeless tobacco: Never  Vaping Use   Vaping Use: Never used  Substance and Sexual Activity   Alcohol use: Never   Drug use: Never   Sexual activity: Not on file  Other Topics Concern   Not on file  Social History Narrative   Not on file   Social Determinants of Health   Financial Resource Strain: Not on file  Food Insecurity: Not on file  Transportation Needs: Not on file  Physical Activity: Not on file  Stress: Not on file  Social Connections: Not on file  Intimate Partner Violence: Not on file    Family History  Problem Relation Age of Onset   Heart disease Mother    CAD Brother       Review of systems complete and found to  be negative unless listed above      PHYSICAL EXAM  General: Generalized weakness and fatigue well developed, well nourished, in no acute distress HEENT:  Normocephalic and atramatic Neck:  No JVD.  Lungs: Clear bilaterally to auscultation and percussion. Heart: HRRR . Normal S1 and S2 without gallops or murmurs.  Abdomen: Bowel sounds are positive, abdomen soft and non-tender  Msk:  Back normal, normal gait. Normal strength and tone for age. Extremities: No clubbing, cyanosis or edema.   Neuro: Alert and oriented X 3.  Lethargic weak fatigue Psych:  Good affect, responds appropriately  Labs:   Lab Results   Component Value Date   WBC 11.9 (H) 02/11/2022   HGB 12.0 (L) 02/11/2022   HCT 37.8 (L) 02/11/2022   MCV 94.5 02/11/2022   PLT 228 02/11/2022    Recent Labs  Lab 02/11/22 0846  NA 127*  K 5.4*  CL 91*  CO2 14*  BUN 38*  CREATININE 2.12*  CALCIUM 9.5  PROT 7.1  BILITOT 1.3*  ALKPHOS 119  ALT 14  AST 26  GLUCOSE 854*   Lab Results  Component Value Date   TROPONINI <0.03 06/07/2017   No results found for: "CHOL" No results found for: "HDL" No results found for: "LDLCALC" No results found for: "TRIG" No results found for: "CHOLHDL" No results found for: "LDLDIRECT"    Radiology: DG Chest 2 View  Result Date: 02/11/2022 CLINICAL DATA:  Dizziness and weakness for 2 days. EXAM: CHEST - 2 VIEW COMPARISON:  June 19, 2019 FINDINGS: The heart size and mediastinal contours are stable. Cardiac pacemaker is stable. No focal infiltrate, pulmonary edema, or pleural effusion noted. The visualized skeletal structures are stable. IMPRESSION: No active cardiopulmonary disease. Electronically Signed   By: Sherian Rein M.D.   On: 02/11/2022 09:58   CT HEAD WO CONTRAST ( )  Result Date: 02/11/2022 CLINICAL DATA:  Mental status change with unknown cause EXAM: CT HEAD WITHOUT CONTRAST TECHNIQUE: Contiguous axial images were obtained from the base of the skull through the vertex without intravenous contrast. RADIATION DOSE REDUCTION: This exam was performed according to the departmental dose-optimization program which includes automated exposure control, adjustment of the mA and/or kV according to patient size and/or use of iterative reconstruction technique. COMPARISON:  05/25/2013 FINDINGS: Brain: No evidence of acute infarction, hemorrhage, hydrocephalus, extra-axial collection or mass lesion/mass effect. Generalized atrophy that is progressed from 2015. Chronic small vessel ischemia in the cerebral white matter. Vascular: No hyperdense vessel or unexpected calcification. Skull: Normal.  Negative for fracture or focal lesion. Sinuses/Orbits: Bilateral cataract resection. IMPRESSION: 1. No acute finding. 2. Generalized atrophy that is progressed from 2015. Electronically Signed   By: Tiburcio Pea M.D.   On: 02/11/2022 09:32    EKG: Normal sinus rhythm nonspecific ST-T changes rate of 90  ASSESSMENT AND PLAN:  Non-STEMI DKA Hyperglycemia Sick sinus syndrome status post permanent pacemaker Dehydration weakness Chronic renal sufficiency stage III Permanent pacemaker in place Known coronary disease previous PCI and stent 2006 . Plan Continue management with therapy for DKA profound hyperglycemia Elevated troponins suggestive of possible non-STEMI denies any chest pain angina consider further ischemia work-up echocardiogram and possible cardiac cath Maintain IV heparin therapy for ACS management follow-up troponins Hyperlipidemia continue simvastatin therapy for lipid management Recommend nephrology follow-up and evaluation for renal insufficiency Echocardiogram for assessment of left ventricular function wall motion after possible non-STEMI Recommend initiating Plavix therapy 75 mg daily for at least 6 months Consider cardiac cath prior to discharge  Signed: Alwyn Pea MD 02/11/2022, 2:11 PM

## 2022-02-11 NOTE — ED Triage Notes (Signed)
Pt reports yesterday he woke up around 7am and felt really weak and off balance. Pt reports symptoms continued throughout the day and then he woke up again this am the same. Denies pain, SOB or other sx;s. States feels weak and off balance

## 2022-02-11 NOTE — Progress Notes (Addendum)
Critical Troponin called to CN [facility] ICU at 12,906. This RN made aware by CN and contacted MD Niu to relay critical results. MD [facility] notified by this RN regarding Critical Results. MD Clyde Lundborg confirmed with this RN to continue Heparin drip which is currently infusing.

## 2022-02-11 NOTE — ED Notes (Signed)
Advised patient has ready bed

## 2022-02-11 NOTE — Consult Note (Addendum)
ANTICOAGULATION CONSULT NOTE - Initial Consult  Pharmacy Consult for heparin infusion - NO BOLUS Indication: chest pain/ACS   No Known Allergies  Patient Measurements: Height: 5\' 6"  (167.6 cm) Weight: 62.3 kg (137 lb 5.6 oz) IBW/kg (Calculated) : 63.8 Heparin Dosing Weight: 59 kg  Vital Signs: Temp: 98 F (36.7 C) (12/03 2000) Temp Source: Oral (12/03 2000) BP: 122/64 (12/03 2100) Pulse Rate: 85 (12/03 1800)  Labs: Recent Labs    02/11/22 0846 02/11/22 1050 02/11/22 1736 02/11/22 1956  HGB 12.0*  --   --   --   HCT 37.8*  --   --   --   PLT 228  --   --   --   APTT  --   --  >200*  --   CREATININE 2.12*  --  2.07*  --   TROPONINIHS 128* 296* 12,906* 15,479*    Estimated Creatinine Clearance: 21.7 mL/min (A) (by C-G formula based on SCr of 2.07 mg/dL (H)).   Medical History: Past Medical History:  Diagnosis Date   Actinic keratosis    Anemia    Chronic kidney disease    "functioning at 50%"   Coronary artery disease    Diabetes mellitus, type 2 (HCC)    Dysrhythmia    Glaucoma    Hypothyroidism    Osteoporosis    Pneumonia    Sleep apnea    could not tolerate CPAP   Wears dentures    partial upper and lower    Medications:  PTA: aspirin 81 mg PO HS Inpatient:  heparin infusion (12/3 >>) Allergies: NKDA  Assessment: 88 y.o. male who presents to the ER for evaluation of weakness and dizziness started yesterday evening.   Goal of Therapy:  Heparin level 0.3-0.7 units/ml Monitor platelets by anticoagulation protocol: Yes  Date Time aPTT/HL Rate/Comment 12/3 1736 >200 / ---   Plan:  Received call from RN that aPTT elevated >200. Lab unaware if this was drawn appropriately or not. Explained to RN and phlebotomist to draw from site opposite of heparin infusion. Patient received enoxparin prior to initation of heparin infusion, may explain elevated aPTT, but will reorder another aPTT STAT.  Check anti-Xa level in 8 hours and daily once consecutively  therapeutic. Continue to monitor H&H and platelets daily while on heparin gtt.  1737 02/11/2022,10:18 PM

## 2022-02-11 NOTE — ED Notes (Signed)
Pt had 12 beat run of V-tach, caught on monitor. Pt sleeping during event. 12 lead EKG obtained post event. Dr. Clyde Lundborg notified.

## 2022-02-11 NOTE — Consult Note (Signed)
ANTICOAGULATION CONSULT NOTE  Pharmacy Consult for heparin infusion - NO BOLUS Indication: chest pain/ACS   No Known Allergies  Patient Measurements: Height: 5\' 6"  (167.6 cm) Weight: 62.3 kg (137 lb 5.6 oz) IBW/kg (Calculated) : 63.8 Heparin Dosing Weight: 59 kg  Vital Signs: Temp: 98 F (36.7 C) (12/03 2000) Temp Source: Oral (12/03 2000) BP: 103/51 (12/03 2300) Pulse Rate: 86 (12/03 2300)  Labs: Recent Labs    02/11/22 0846 02/11/22 1050 02/11/22 1736 02/11/22 1956 02/11/22 2137 02/11/22 2247  HGB 12.0*  --   --   --   --   --   HCT 37.8*  --   --   --   --   --   PLT 228  --   --   --   --   --   APTT  --   --  >200*  --   --  105*  CREATININE 2.12*  --  2.07*  --  1.94*  --   TROPONINIHS 128*   < > 12,906* 15,479* 13,778*  --    < > = values in this interval not displayed.     Estimated Creatinine Clearance: 23.2 mL/min (A) (by C-G formula based on SCr of 1.94 mg/dL (H)).   Medical History: Past Medical History:  Diagnosis Date   Actinic keratosis    Anemia    Chronic kidney disease    "functioning at 50%"   Coronary artery disease    Diabetes mellitus, type 2 (HCC)    Dysrhythmia    Glaucoma    Hypothyroidism    Osteoporosis    Pneumonia    Sleep apnea    could not tolerate CPAP   Wears dentures    partial upper and lower    Medications:  PTA: aspirin 81 mg PO HS Inpatient:  heparin infusion (12/3 >>) Allergies: NKDA  Assessment: 88 y.o. male who presents to the ER for evaluation of weakness and dizziness started yesterday evening.   Goal of Therapy:  Heparin level 0.3-0.7 units/ml Monitor platelets by anticoagulation protocol: Yes  Date Time aPTT/HL Rate/Comment 12/3 1736 >200 / ---  12/4 2247 105 / --- aPTT trending down  Plan:  Check HL scheduled for 0300, then adjust rate if appropriate. CBC daily while on heparin.  [id number], PharmD, [facility] 02/12/2022 12:03 AM

## 2022-02-11 NOTE — ED Notes (Signed)
FBS "Hi", Endo tool to remain at 5.5u/hr

## 2022-02-11 NOTE — H&P (Signed)
History and Physical    Logan Strong FKC:[id number] DOB: [birth year] DOA: 02/11/2022  Referring MD/NP/PA:   PCP: Barbette Reichmann, MD   Patient coming from:  The patient is coming from home.  At baseline, pt is independent for most of ADL.        Chief Complaint: weakness  HPI: Logan Strong is a 88 y.o. male with medical history significant of DM on insulin pump, hypertension, hyperlipidemia, hypothyroidism, CAD with stent placement, CKD stage IIIa, anemia, s/p for pacemaker placement, depression, who presents with weakness.  Patient states that she started feeling weak yesterday evening.  Associated with mild lightheadedness, denies severe dizziness.  He also has right arm tingling since yesterday, no unilateral weakness, facial droop or slurred speech.  No fall.  Patient denies chest pain, cough, shortness breath.  Patient states that he has intermittent diarrhea, particularly when he eats pork, which has not changed.  No nausea, vomiting, or abdominal pain.  No symptoms of UTI. Patient states that he thinks his insulin pump is not functioning well.  Data reviewed independently and ED Course: pt was found to have DKA (blood sugar 854, bicarbonate 14, anion gap 22, pH 7.17, beta hydroxybutyric acid of 4.13, ketones 20 urinalysis). WBC 11.9, pseudohyponatremia, potassium 5.4, worsening renal function with creatinine 2.12, BUN 38, GFR 29 (baseline creatinine 1.3-1.7), temperature normal, blood pressure 108/80, heart rate 94, RR 18, oxygen saturation 99% on room air.  Chest x-ray negative.  CT of head is negative for acute intracranial abnormalities.  Patient is admitted to stepdown as inpatient.   EKG: I have personally reviewed.  First EKG showed sinus rhythm, QTc 447, ST elevation only V1, left atrial enlargement, poor R wave progression. The repeated EKG showed sinus rhythm, QTc 439, no ST elevation, borderline left axis deviation, poor R wave progression, anteroseptal infarction pattern.   I do not see clear pacemaker mark.  Review of Systems:   General: no fevers, chills, no body weight gain, has fatigue HEENT: no blurry vision, hearing changes or sore throat Respiratory: no dyspnea, coughing, wheezing CV: no chest pain, no palpitations GI: no nausea, vomiting, abdominal pain, diarrhea, constipation GU: no dysuria, burning on urination, increased urinary frequency, hematuria  Ext: no leg edema Neuro: no vision change or hearing loss.  Has right arm tingling Skin: no rash, no skin tear. MSK: No muscle spasm, no deformity, no limitation of range of movement in spin Heme: No easy bruising.  Travel history: No recent long distant travel.   Allergy: No Known Allergies  Past Medical History:  Diagnosis Date   Actinic keratosis    Anemia    Chronic kidney disease    "functioning at 50%"   Coronary artery disease    Diabetes mellitus, type 2 (HCC)    Dysrhythmia    Glaucoma    Hypothyroidism    Osteoporosis    Pneumonia    Sleep apnea    could not tolerate CPAP   Wears dentures    partial upper and lower    Past Surgical History:  Procedure Laterality Date   APPENDECTOMY     BACK SURGERY     lumbar   CARDIAC CATHETERIZATION  2006   stent x1   CATARACT EXTRACTION W/PHACO Right 12/19/2016   Procedure: CATARACT EXTRACTION PHACO AND INTRAOCULAR LENS PLACEMENT (IOC) RIGHT DIABETIC;  Surgeon: Lockie Mola, MD;  Location: [facility];  Service: Ophthalmology;  Laterality: Right;   HERNIA REPAIR     PACEMAKER INSERTION N/A 06/13/2017  Procedure: INSERTION PACEMAKER INITIAL DUAL CHAMBER INSERTION;  Surgeon: Marcina MillardParaschos, Alexander, MD;  Location: [facility];  Service: Cardiovascular;  Laterality: N/A;   TRABECULECTOMY Right 12/19/2016   Procedure: TRABECULECTOMY WITH [facility] AND [facility] SH[facility]T;  Surgeon: Lockie MolaBrasington, Chadwick, MD;  [facility];  Service: Ophthalmology;  Laterality: Right;  Diabetic - insulin pump sleep apnea    Social  History:  reports that he quit smoking about 52 years ago. He has never used smokeless tobacco. He reports that he does not drink alcohol and does not use drugs.  Family History:  Family History  Problem Relation Age of Onset   Heart disease Mother    CAD Brother      Prior to Admission medications   Medication Sig Start Date End Date Taking? Authorizing Provider  aspirin 81 MG tablet Take 81 mg by mouth at bedtime.     [provider]  brimonidine (ALPHAGAN) 0.2 % ophthalmic solution Place 1 drop into both eyes in the morning and at bedtime. 04/29/19   [provider]  CALCIUM CARBONATE-VITAMIN D PO Take 1 tablet by mouth at bedtime.     [provider]  dorzolamide (TRUSOPT) 2 % ophthalmic solution Place 1 drop into the right eye 2 (two) times daily.     [provider]  Fluorouracil 5 % SOLN Apply 1 application topically daily. X 2 weeks to scalp. To start August 13, 2019 07/13/19   Deirdre EvenerKowalski, David C, MD  insulin aspart (NOVOLOG) 100 UNIT/ML injection Inject 30-50 Units into the skin daily. In pump.  [place] and bolus before each meal    [provider]  insulin glargine (LANTUS) 100 UNIT/ML injection Inject 15 Units into the skin as needed.     [provider]  latanoprost (XALATAN) 0.005 % ophthalmic solution Place 1 drop into the right eye at bedtime.     [provider]  levothyroxine (SYNTHROID) 75 MCG tablet Take 75 mcg by mouth daily before breakfast.     [provider]  lisinopril (PRINIVIL,ZESTRIL) 5 MG tablet Take 5 mg by mouth at bedtime.     [provider]  Omega-3 Fatty Acids (FISH OIL PO) Take 1 capsule by mouth daily.     [provider]  simvastatin (ZOCOR) 40 MG tablet Take 40 mg by mouth at bedtime.     [provider]  timolol (BETIMOL) 0.5 % ophthalmic solution Place 1 drop into both eyes 2 (two) times daily.    [provider]  traMADol (ULTRAM) 50 MG tablet Take 1  tablet (50 mg total) by mouth every 6 (six) hours as needed. 05/13/18   Fisher, Roselyn BeringSusan W, PA-C  vitamin B-12 (CYANOCOBALAMIN) 1000 MCG tablet Take 1,000 mcg by mouth daily.     [provider]    Physical Exam: Vitals:   02/11/22 [id number] 02/11/22 0845 02/11/22 0846 02/11/22 1355  BP:  108/80  124/64  Pulse:  94  (!) 102  Resp:  18  18  Temp:  98.5 F (36.9 C) 97.8 F (36.6 C) 97.6 F (36.4 C)  TempSrc:  Oral Oral   SpO2:  99%  100%  Weight: 59 kg     Height: 5\' 6"  (1.676 m)      General: Not in acute distress HEENT:       Eyes: PERRL, EOMI, no scleral icterus.       ENT: No discharge from the ears and nose, no pharynx injection, no tonsillar enlargement.  Neck: No JVD, no bruit, no mass felt. Heme: No neck lymph node enlargement. Cardiac: S1/S2, RRR, No murmurs, No gallops or rubs. Respiratory: No rales, wheezing, rhonchi or rubs. GI: Soft, nondistended, nontender, no rebound pain, no organomegaly, BS present. GU: No hematuria Ext: No pitting leg edema bilaterally. 1+DP/PT pulse bilaterally. Musculoskeletal: No joint deformities, No joint redness or warmth, no limitation of ROM in spin. Skin: No rashes.  Neuro: Alert, oriented X3, cranial nerves II-XII grossly intact, moves all extremities normally. Muscle strength 5/5 in all extremities, sensation to light touch intact. Psych: Patient is not psychotic, no suicidal or hemocidal ideation.  Labs on Admission: I have personally reviewed following labs and imaging studies  CBC: Recent Labs  Lab 02/11/22 0846  WBC 11.9*  HGB 12.0*  HCT 37.8*  MCV 94.5  PLT 228   Basic Metabolic Panel: Recent Labs  Lab 02/11/22 0846  NA 127*  K 5.4*  CL 91*  CO2 14*  GLUCOSE 854*  BUN 38*  CREATININE 2.12*  CALCIUM 9.5   GFR: Estimated Creatinine Clearance: 20.1 mL/min (A) (by C-G formula based on SCr of 2.12 mg/dL (H)). Liver Function Tests: Recent Labs  Lab 02/11/22 0846  AST 26  ALT 14  ALKPHOS 119  BILITOT  1.3*  PROT 7.1  ALBUMIN 3.7   No results for input(s): "LIPASE", "AMYLASE" in the last 168 hours. No results for input(s): "AMMONIA" in the last 168 hours. Coagulation Profile: No results for input(s): "INR", "PROTIME" in the last 168 hours. Cardiac Enzymes: No results for input(s): "CKTOTAL", "CKMB", "CKMBINDEX", "TROPONINI" in the last 168 hours. BNP (last 3 results) No results for input(s): "PROBNP" in the last 8760 hours. HbA1C: No results for input(s): "HGBA1C" in the last 72 hours. CBG: Recent Labs  Lab 02/11/22 1155 02/11/22 1311 02/11/22 1353  GLUCAP >600* >600* >600*   Lipid Profile: No results for input(s): "CHOL", "HDL", "LDLCALC", "TRIG", "CHOLHDL", "LDLDIRECT" in the last 72 hours. Thyroid Function Tests: No results for input(s): "TSH", "T4TOTAL", "FREET4", "T3FREE", "THYROIDAB" in the last 72 hours. Anemia Panel: No results for input(s): "VITAMINB12", "FOLATE", "FERRITIN", "TIBC", "IRON", "RETICCTPCT" in the last 72 hours. Urine analysis:    Component Value Date/Time   COLORURINE STRAW (A) 02/11/2022 1050   APPEARANCEUR CLEAR (A) 02/11/2022 1050   LABSPEC 1.023 02/11/2022 1050   PHURINE 5.0 02/11/2022 1050   GLUCOSEU >=500 (A) 02/11/2022 1050   HGBUR NEGATIVE 02/11/2022 1050   BILIRUBINUR NEGATIVE 02/11/2022 1050   KETONESUR 20 (A) 02/11/2022 1050   PROTEINUR NEGATIVE 02/11/2022 1050   NITRITE NEGATIVE 02/11/2022 1050   LEUKOCYTESUR NEGATIVE 02/11/2022 1050   Sepsis Labs: @LABRCNTIP (procalcitonin:4,lacticidven:4) )No results found for this or any previous visit (from the past 240 hour(s)).   Radiological Exams on Admission: DG Chest 2 View  Result Date: 02/11/2022 CLINICAL DATA:  Dizziness and weakness for 2 days. EXAM: CHEST - 2 VIEW COMPARISON:  June 19, 2019 FINDINGS: The heart size and mediastinal contours are stable. Cardiac pacemaker is stable. No focal infiltrate, pulmonary edema, or pleural effusion noted. The visualized skeletal structures are  stable. IMPRESSION: No active cardiopulmonary disease. Electronically Signed   By: June 21, 2019 M.D.   On: 02/11/2022 09:58   CT HEAD WO CONTRAST (14/05/2021)  Result Date: 02/11/2022 CLINICAL DATA:  Mental status change with unknown cause EXAM: CT HEAD WITHOUT CONTRAST TECHNIQUE: Contiguous axial images were obtained from the base of the skull through the vertex without intravenous contrast. RADIATION DOSE REDUCTION: This exam was performed according to the  departmental dose-optimization program which includes automated exposure control, adjustment of the mA and/or kV according to patient size and/or use of iterative reconstruction technique. COMPARISON:  05/25/2013 FINDINGS: Brain: No evidence of acute infarction, hemorrhage, hydrocephalus, extra-axial collection or mass lesion/mass effect. Generalized atrophy that is progressed from 2015. Chronic small vessel ischemia in the cerebral white matter. Vascular: No hyperdense vessel or unexpected calcification. Skull: Normal. Negative for fracture or focal lesion. Sinuses/Orbits: Bilateral cataract resection. IMPRESSION: 1. No acute finding. 2. Generalized atrophy that is progressed from 2015. Electronically Signed   By: Tiburcio Pea M.D.   On: 02/11/2022 09:32      Assessment/Plan Principal Problem:   DKA (diabetic ketoacidosis) (HCC) Active Problems:   Type II diabetes mellitus with renal manifestations (HCC)   Acute renal failure superimposed on stage 3a chronic kidney disease (HCC)   Essential hypertension   Abnormal EKG   Coronary artery disease   Hypothyroidism   HLD (hyperlipidemia)   Leukocytosis   NSTEMI (non-ST elevated myocardial infarction) (HCC)   Hyperkalemia   Numbness and tingling of right arm   Depression   Assessment and Plan:   DKA (diabetic ketoacidosis) (HCC): Blood sugar 854, bicarbonate 14, anion gap 22, pH 7.17, beta hydroxybutyric acid of 4.13, ketones 20 by UA.  Mental status normal.  - Admit to stepdown as inpt -  IVF:  2L of IVF bolus - start DKA protocol with BMP q4h - IVF: LR at 125 cc/h, will switch to D5-LR at 125 cc/h when CBG<250 - replete K as needed - Zofran prn nausea  - NPO  - consult to diabetic educator  Type II diabetes mellitus with renal manifestations [facility]):: Recent A1c 6.9, well-controlled.  Patient is using insulin pump which is likely not functioning well. -on DKA protocol now.  Acute renal failure superimposed on stage 3a chronic kidney disease (HCC): Likely due to dehydration -Hold lisinopril -IV fluid as above  Essential hypertension: Blood pressure 108/80 -IV hydralazine as needed -Hold lisinopril due to worsening renal function  NSTEMI and hx of Coronary artery disease: s/p of stent. Trop 128 --> 296. No chest pain. Has ST elevation only in V1 - Trend Trop - start IV heparin without bolus - prn Nitroglycerin, Morphine - Aspirin, lipitor  - Risk factor stratification: will check FLP and A1C   Hypothyroidism -Synthroid  HLD (hyperlipidemia): Patient is not taking Zocor currently -Started Lipitor due to non-STEMI, 40 mg daily -Follow-up FLP  Leukocytosis: WBC 11.9, no source of infection identified, likely reactive -Follow-up with CBC  Hyperkalemia: Potassium 5.4 due to DKA -Expecting correction with insulin drip and IV fluid  Numbness and tingling of right arm: CT head negative.  Cannot do MRI for brain due to presence of pacemaker -On aspirin and Lipitor  Depression: -Celexa  DVT ppx: On IV heparin  Code Status: DNR (I discussed with patient and explained the meaning of CODE STATUS. Patient wants to be DNR)  Family Communication: I called her daughter by phone.  Disposition Plan:  Anticipate discharge back to previous environment  Consults called:  Dr. Juliann Pares of card  Admission status and Level of care: Stepdown:   as inpt      Dispo: The patient is from: Home              Anticipated d/c is to: Home              Anticipated d/c date is: 2  days  Patient currently is not medically stable to d/c.    Severity of Illness:  The appropriate patient status for this patient is INPATIENT. Inpatient status is judged to be reasonable and necessary in order to provide the required intensity of service to ensure the patient's safety. The patient's presenting symptoms, physical exam findings, and initial radiographic and laboratory data in the context of their chronic comorbidities is felt to place them at high risk for further clinical deterioration. Furthermore, it is not anticipated that the patient will be medically stable for discharge from the hospital within 2 midnights of admission.   * I certify that at the point of admission it is my clinical judgment that the patient will require inpatient hospital care spanning beyond 2 midnights from the point of admission due to high intensity of service, high risk for further deterioration and high frequency of surveillance required.*       Date of Service 02/11/2022    Lorretta Harp [facility]   If 7PM-7AM, please contact night-coverage [email or web address] 02/11/2022, 2:04 PM

## 2022-02-11 NOTE — ED Notes (Signed)
FBG "HIGH"  Endo tool to remain at 5.5 U/hr

## 2022-02-12 ENCOUNTER — Inpatient Hospital Stay
Admit: 2022-02-12 | Discharge: 2022-02-12 | Disposition: A | Discharge status: Home / Self Care | Payer: Medicare HMO | Attending: Cardiology | Admitting: Cardiology

## 2022-02-12 DIAGNOSIS — Z7189 Other specified counseling: Secondary | ICD-10-CM

## 2022-02-12 DIAGNOSIS — I214 Non-ST elevation (NSTEMI) myocardial infarction: Secondary | ICD-10-CM

## 2022-02-12 LAB — ECHOCARDIOGRAM COMPLETE
AR max vel: 1.89 cm2
AV Area VTI: 2.16 cm2
AV Area mean vel: 1.89 cm2
AV Mean grad: 4 mmHg
AV Peak grad: 6.4 mmHg
Ao pk vel: 1.26 m/s
Area-P 1/2: 2.64 cm2
Height: 66 in
S' Lateral: 2.5 cm
Weight: 2197.55 oz

## 2022-02-12 LAB — GLUCOSE, CAPILLARY
Glucose-Capillary: 160 mg/dL — ABNORMAL HIGH (ref 70–99)
Glucose-Capillary: 169 mg/dL — ABNORMAL HIGH (ref 70–99)
Glucose-Capillary: 156 mg/dL — ABNORMAL HIGH (ref 70–99)
Glucose-Capillary: 124 mg/dL — ABNORMAL HIGH (ref 70–99)
Glucose-Capillary: 121 mg/dL — ABNORMAL HIGH (ref 70–99)
Glucose-Capillary: 147 mg/dL — ABNORMAL HIGH (ref 70–99)
Glucose-Capillary: 149 mg/dL — ABNORMAL HIGH (ref 70–99)
Glucose-Capillary: 136 mg/dL — ABNORMAL HIGH (ref 70–99)
Glucose-Capillary: 139 mg/dL — ABNORMAL HIGH (ref 70–99)
Glucose-Capillary: 142 mg/dL — ABNORMAL HIGH (ref 70–99)
Glucose-Capillary: 127 mg/dL — ABNORMAL HIGH (ref 70–99)
Glucose-Capillary: 111 mg/dL — ABNORMAL HIGH (ref 70–99)
Glucose-Capillary: 113 mg/dL — ABNORMAL HIGH (ref 70–99)

## 2022-02-12 LAB — HEPARIN LEVEL (UNFRACTIONATED)
Heparin Unfractionated: 0.44 IU/mL (ref 0.30–0.70)
Heparin Unfractionated: 0.37 IU/mL (ref 0.30–0.70)

## 2022-02-12 LAB — BASIC METABOLIC PANEL
Anion gap: 6 (ref 5–15)
BUN: 37 mg/dL — ABNORMAL HIGH (ref 8–23)
CO2: 24 mmol/L (ref 22–32)
Calcium: 8.7 mg/dL — ABNORMAL LOW (ref 8.9–10.3)
Chloride: 105 mmol/L (ref 98–111)
Creatinine, Ser: 1.75 mg/dL — ABNORMAL HIGH (ref 0.61–1.24)
GFR, Estimated: 37 mL/min — ABNORMAL LOW (ref >60)
Glucose, Bld: 189 mg/dL — ABNORMAL HIGH (ref 70–99)
Potassium: 3.4 mmol/L — ABNORMAL LOW (ref 3.5–5.1)
Sodium: 135 mmol/L (ref 135–145)

## 2022-02-12 LAB — LIPID PANEL
Cholesterol: 121 mg/dL (ref 0–200)
HDL: 59 mg/dL
LDL Cholesterol: 56 mg/dL (ref 0–99)
Total CHOL/HDL Ratio: 2.1 ratio
Triglycerides: 32 mg/dL
VLDL: 6 mg/dL (ref 0–40)

## 2022-02-12 LAB — CBC
HCT: 30.1 % — ABNORMAL LOW (ref 39.0–52.0)
Hemoglobin: 10.3 g/dL — ABNORMAL LOW (ref 13.0–17.0)
MCH: 29.9 pg (ref 26.0–34.0)
MCHC: 34.2 g/dL (ref 30.0–36.0)
MCV: 87.5 fL (ref 80.0–100.0)
Platelets: 201 10*3/uL (ref 150–400)
RBC: 3.44 MIL/uL — ABNORMAL LOW (ref 4.22–5.81)
RDW: 13.1 % (ref 11.5–15.5)
WBC: 14.7 10*3/uL — ABNORMAL HIGH (ref 4.0–10.5)
nRBC: 0 % (ref 0.0–0.2)

## 2022-02-12 LAB — BETA-HYDROXYBUTYRIC ACID: Beta-Hydroxybutyric Acid: 0.27 mmol/L (ref 0.05–0.27)

## 2022-02-12 LAB — HEMOGLOBIN A1C
Hgb A1c MFr Bld: 7.7 % — ABNORMAL HIGH (ref 4.8–5.6)
Mean Plasma Glucose: 174 mg/dL

## 2022-02-12 MED ORDER — INSULIN GLARGINE-YFGN 100 UNIT/ML ~~LOC~~ SOLN
10.0000 [IU] | Freq: Every day | SUBCUTANEOUS | Status: DC
  Administered 2022-02-12: 10 [IU] via SUBCUTANEOUS
  Filled 2022-02-12 (×2): qty 0.1

## 2022-02-12 MED ORDER — ASPIRIN 300 MG RE SUPP: 300.0000 mg | Freq: Once | RECTAL | Status: AC

## 2022-02-12 MED ORDER — POTASSIUM CHLORIDE 10 MEQ/100ML IV SOLN
10.0000 meq | INTRAVENOUS | Status: DC
  Filled 2022-02-12 (×2): qty 100

## 2022-02-12 MED ORDER — ASPIRIN 81 MG PO CHEW
324.0000 mg | CHEWABLE_TABLET | Freq: Once | ORAL | Status: AC
  Administered 2022-02-12: 324 mg via ORAL
  Filled 2022-02-12: qty 4

## 2022-02-12 MED ORDER — CLOPIDOGREL BISULFATE 75 MG PO TABS
75.0000 mg | ORAL_TABLET | Freq: Every day | ORAL | Status: DC
  Administered 2022-02-12 – 2022-02-13 (×2): 75 mg via ORAL
  Filled 2022-02-12 (×2): qty 1

## 2022-02-12 MED ORDER — ASPIRIN 81 MG PO CHEW
81.0000 mg | CHEWABLE_TABLET | Freq: Every day | ORAL | Status: DC
  Administered 2022-02-13: 81 mg via ORAL
  Filled 2022-02-12: qty 1

## 2022-02-12 MED ORDER — INSULIN ASPART 100 UNIT/ML IJ SOLN
3.0000 [IU] | Freq: Three times a day (TID) | INTRAMUSCULAR | Status: DC
  Administered 2022-02-12: 3 [IU] via SUBCUTANEOUS
  Filled 2022-02-12 (×2): qty 1

## 2022-02-12 MED ORDER — LEVOTHYROXINE SODIUM 50 MCG PO TABS
75.0000 ug | ORAL_TABLET | Freq: Every day | ORAL | Status: DC
  Administered 2022-02-13: 75 ug via ORAL
  Filled 2022-02-12: qty 2

## 2022-02-12 MED ORDER — POTASSIUM CHLORIDE CRYS ER 20 MEQ PO TBCR
20.0000 meq | EXTENDED_RELEASE_TABLET | Freq: Once | ORAL | Status: AC
  Administered 2022-02-12: 20 meq via ORAL
  Filled 2022-02-12: qty 1

## 2022-02-12 MED ORDER — INSULIN ASPART 100 UNIT/ML IJ SOLN
0.0000 [IU] | INTRAMUSCULAR | Status: DC
  Administered 2022-02-12 – 2022-02-13 (×3): 1 [IU] via SUBCUTANEOUS
  Filled 2022-02-12 (×4): qty 1

## 2022-02-12 NOTE — TOC Initial Note (Signed)
Transition of Care [facility]) - Initial/Assessment Note    Patient Details  Name: Logan Strong MRN: [id number] Date of Birth: 03-31-1931  [facility] CM/SW Contact:    Shelbie Hutching, RN Phone Number: 02/12/2022, 2:19 PM  Clinical Narrative:                 Patient admitted to the hospital with DKA requiring insulin drip in the ICU.  Patient is now off insulin infusion.  RNCM met with patient and his daughter, Mordecai Rasmussen at the bedside.  Patient is from home where he lives alone.  Daughter, Mordecai Rasmussen comes over every day in the evening and spends several hours.  She cooks on Sundays and that lasts the patient about all week.  The only day Mordecai Rasmussen does not come over is Saturdays.  Patient walks with a cane or walker, he has been weaker over the past 2 weeks.  Patient wants to go home, his wife died this past 07/20/22.  Daughter reports that patient has been sad since then and can tell a decline.  She wants patient to go home with hospice and chooses [facility].   Lorayne Bender accepted referral and has everything arranged for patient to discharge tomorrow.  Daughter will be able to provide transportation home tomorrow.   Expected Discharge Plan: Home w Hospice Care Barriers to Discharge: Continued Medical Work up   Patient Goals and CMS Choice Patient states their goals for this hospitalization and ongoing recovery are:: Patient wants to get back home CMS Medicare.gov Compare Post Acute Care list provided to:: Patient Represenative (must comment) Choice offered to / list presented to : Adult Children  Expected Discharge Plan and Services Expected Discharge Plan: Home w Hospice Care   Discharge Planning Services: CM Consult Post Acute Care Choice: Hospice Living arrangements for the past 2 months: Single Family Home                 DME Arranged: N/A DME Agency: NA       HH Arranged: NA          Prior Living Arrangements/Services Living arrangements for the past 2 months: Single Family  Home Lives with:: Self Patient language and need for interpreter reviewed:: Yes Do you feel safe going back to the place where you live?: Yes      Need for Family Participation in Patient Care: Yes (Comment) Care giver support system in place?: Yes (comment) Current home services: DME (cane, rollator, standard walker) Criminal Activity/Legal Involvement Pertinent to Current Situation/Hospitalization: No - Comment as needed  Activities of Daily Living Home Assistive Devices/Equipment: Walker (specify type) ADL Screening (condition at time of admission) Patient's cognitive ability adequate to safely complete daily activities?: Yes Is the patient deaf or have difficulty hearing?: No Does the patient have difficulty seeing, even when wearing glasses/contacts?: No Does the patient have difficulty concentrating, remembering, or making decisions?: No Patient able to express need for assistance with ADLs?: Yes Does the patient have difficulty dressing or bathing?: No Independently performs ADLs?: Yes (appropriate for developmental age) Does the patient have difficulty walking or climbing stairs?: Yes Weakness of Legs: Both Weakness of Arms/Hands: Both  Permission Sought/Granted Permission sought to share information with : Case Manager, Family Supports, Other (comment) Permission granted to share information with : Yes, Verbal Permission Granted  Share Information with NAME: Deaundre Allston  Permission granted to share info w AGENCY: [place] granted to share info w Relationship: daughter  Permission granted to  share info w Contact Information: [phone]  Emotional Assessment Appearance:: Appears older than stated age Attitude/Demeanor/Rapport: Engaged   Orientation: : Oriented to Self, Oriented to Place, Oriented to  Time, Oriented to Situation Alcohol / Substance Use: Not Applicable Psych Involvement: No (comment)  Admission diagnosis:  DKA (diabetic ketoacidosis)  ([place]) [E11.10] Type 1 diabetes mellitus with ketoacidosis without coma ([place]) [E10.10] Patient Active Problem List   Diagnosis Date Noted   DKA (diabetic ketoacidosis) ([place]) 02/11/2022   Abnormal EKG 02/11/2022   Hypothyroidism 02/11/2022   Coronary artery disease 02/11/2022   Acute renal failure superimposed on stage 3a chronic kidney disease ([place]) 02/11/2022   Hyperkalemia 02/11/2022   HLD (hyperlipidemia) 02/11/2022   Leukocytosis 02/11/2022   Numbness and tingling of right arm 02/11/2022   Type II diabetes mellitus with renal manifestations ([place]) 02/11/2022   NSTEMI (non-ST elevated myocardial infarction) ([place]) 02/11/2022   Depression 02/11/2022   Elevated troponin    Generalized weakness 06/19/2019   Dyspnea    Essential hypertension    Hyperglycemia 06/12/2017   Pneumonia 06/07/2017   PCP:  Tracie Harrier, MD Pharmacy:   [facility] [address]), [place] [address]  [place])  [address] Phone: [phone] Fax: [facility] [place], [place] [address] Phone: [phone] Fax: [phone]     Social Determinants of Health (SDOH) Interventions    Readmission Risk Interventions     No data to display

## 2022-02-12 NOTE — Assessment & Plan Note (Signed)
Transitioned off to subcu insulin per diabetic nurse recommendation

## 2022-02-12 NOTE — Assessment & Plan Note (Signed)
Lab Results  Component Value Date   CREATININE 1.75 (H) 02/12/2022   CREATININE 1.94 (H) 02/11/2022   CREATININE 2.07 (H) 02/11/2022  Monitor.  Avoid any nephrotoxic medication.  Renal function close to baseline.

## 2022-02-12 NOTE — Assessment & Plan Note (Signed)
Continue Lipitor and Lovaza.

## 2022-02-12 NOTE — Hospital Course (Signed)
88 y.o. male with medical history significant of DM on insulin pump, hypertension, hyperlipidemia, hypothyroidism, CAD with stent placement, CKD stage IIIa, anemia, s/p for pacemaker placement, depression admitted for weakness and was found to have DKA and likely NSTEMI  12/4: Transitioned off insulin drip.  Daughter requesting hospice evaluation.

## 2022-02-12 NOTE — Assessment & Plan Note (Signed)
Continue as needed hydralazine

## 2022-02-12 NOTE — IPAL (Signed)
  Interdisciplinary Goals of Care Family Meeting   Date carried out: 02/12/2022  Location of the meeting: Bedside  Member's involved: Physician and Family Member or next of kin  Durable Power of Attorney or Environmental health practitioner: Daughter/HCPOA  Discussion: We discussed goals of care for Logan Strong. Patient's daughter reported patient has had significant clinical and functional decline since 04-07-23when his wife passed away at home they were together for about 62 years. She is concerned about him and requesting hospice evaluation.  Code status: Full DNR  Disposition: Home with Hospice  Time spent for the meeting: 35 minutes    [name], MD  02/12/2022, 4:08 PM

## 2022-02-12 NOTE — Progress Notes (Signed)
  Progress Note   Patient: Logan Strong DOB: [birth year] DOA: 02/11/2022     1 DOS: the patient was seen and examined on 02/12/2022   Brief hospital course: 88 y.o. male with medical history significant of DM on insulin pump, hypertension, hyperlipidemia, hypothyroidism, CAD with stent placement, CKD stage IIIa, anemia, s/p for pacemaker placement, depression admitted for weakness and was found to have DKA and likely NSTEMI  12/4: Transitioned off insulin drip.  Daughter requesting hospice evaluation.  Assessment and Plan: * DKA (diabetic ketoacidosis) (HCC) Transitioned off to subcu insulin per diabetic nurse recommendation  Acute renal failure superimposed on stage 3a chronic kidney disease (HCC) Lab Results  Component Value Date   CREATININE 1.75 (H) 02/12/2022   CREATININE 1.94 (H) 02/11/2022   CREATININE 2.07 (H) 02/11/2022  Monitor.  Avoid any nephrotoxic medication.  Renal function close to baseline.  Essential hypertension Continue as needed hydralazine  Coronary artery disease Cardiology was considering cath as his EF has dropped on echo but patient/family is planning hospice at home and not having any active cardiac symptoms cardiac cath has been held off.  Patient/family in agreement  Hypothyroidism Continue levothyroxine  HLD (hyperlipidemia) Continue Lipitor and Lovaza  NSTEMI (non-ST elevated myocardial infarction) [facility]) Conservative management plan.  Troponin peaked at 15,479.  Continue heparin drip Family requesting hospice evaluation   Patient's daughter reported patient has had significant clinical and functional decline since 2023-05-05when his wife passed away at home they were together for about 62 years.  She is concerned about him and requesting hospice evaluation     Subjective: Not having any new symptoms.  Feeling weak.  Requesting to get him home soon  Physical Exam: Vitals:   02/12/22 0900 02/12/22 1000 02/12/22 1100 02/12/22  1200  BP: (!) 137/92 113/76 114/61 133/66  Pulse: 67 69 64 66  Resp: 19 (!) 21 17 16   Temp:      TempSrc:      SpO2: 100% 100% 100% 99%  Weight:      Height:       88 year old cachectic looking male lying in the bed comfortably without any acute distress Lungs clear to auscultation bilaterally Cardiovascular regular rate and rhythm Abdomen soft, benign Neuro alert and awake, nonfocal.  His right PPL is larger than the left which is his baseline and has been followed by Dr. 98 at [facility] eye Skin no rash or lesion Psych normal mood and affect Data Reviewed:  Troponin peaked at 15479  Family Communication: Daughter updated at bedside  Disposition: Status is: Inpatient Remains inpatient appropriate because: Hospice evaluation.  Possible discharge home tomorrow with hospice  Planned Discharge Destination:  Home with hospice   DVT prophylaxis-heparin drip Time spent: 35 minutes  Author: [facility], MD 02/12/2022 4:04 PM  For on call review [email or web address].

## 2022-02-12 NOTE — Assessment & Plan Note (Signed)
Continue levothyroxine 

## 2022-02-12 NOTE — Progress Notes (Signed)
Logan Strong       Patient ID: Logan Strong MRN: [id number] DOB/AGE: Apr 24, 1931 88 y.o.  Admit date: 02/11/2022 Referring Physician Dr. Clyde Lundborg Primary Physician  Primary Cardiologist Dr. Juliann Pares Reason for Consultation NSTEMI  HPI: Logan Strong is an 55yoM with a PMH of T1DM, SSS s/p Medtronic DC PPM, HFpEF (55-60% 06/2019), CAD with hx PCI 2006, CKD 3, OSA who presented to [facility] ED 03/10/2022 with significant weakness, severe dizziness decreased responsiveness per his daughter.  He has had some issues with his insulin pump, and on arrival his blood glucose was 864 with an anion gap of 22, beta hydroxybutyric acid 4.13.  He was treated for DKA and his anion gap closed the morning of 12/4.  Cardiology is consulted because of his elevated troponin which peaked at 15000 and echo showed a reduced ejection fraction to 45-50%, consistent with NSTEMI.  Interval history: -Patient "feels great" today, happy he was able to eat and drink, although was only liquids.  Denies chest pain, shortness of breath, or other complaints. -He is eager to get out of the hospital.  Review of systems complete and found to be negative unless listed above     Past Medical History:  Diagnosis Date   Actinic keratosis    Anemia    Chronic kidney disease    "functioning at 50%"   Coronary artery disease    Diabetes mellitus, type 2 (HCC)    Dysrhythmia    Glaucoma    Hypothyroidism    Osteoporosis    Pneumonia    Sleep apnea    could not tolerate CPAP   Wears dentures    partial upper and lower    Past Surgical History:  Procedure Laterality Date   APPENDECTOMY     BACK SURGERY     lumbar   CARDIAC CATHETERIZATION  2006   stent x1   CATARACT EXTRACTION W/PHACO Right 12/19/2016   Procedure: CATARACT EXTRACTION PHACO AND INTRAOCULAR LENS PLACEMENT (IOC) RIGHT DIABETIC;  Surgeon: Lockie Mola, MD;  Location: [facility];  Service: Ophthalmology;   Laterality: Right;   HERNIA REPAIR     PACEMAKER INSERTION N/A 06/13/2017   Procedure: INSERTION PACEMAKER INITIAL DUAL CHAMBER INSERTION;  Surgeon: Marcina Millard, MD;  Location: [facility] ORS;  Service: Cardiovascular;  Laterality: N/A;   TRABECULECTOMY Right 12/19/2016   Procedure: TRABECULECTOMY WITH [facility] AND EXPRESS SHUNT;  Surgeon: Lockie Mola, MD;  Location: [facility];  Service: Ophthalmology;  Laterality: Right;  Diabetic - insulin pump sleep apnea    Medications Prior to Admission  Medication Sig Dispense Refill Last Dose   alendronate (FOSAMAX) 35 MG tablet Take 35 mg by mouth once a week.   02/11/2022   aspirin 81 MG tablet Take 81 mg by mouth at bedtime.    02/11/2022   brimonidine (ALPHAGAN) 0.2 % ophthalmic solution Place 1 drop into both eyes in the morning and at bedtime.   02/11/2022   CALCIUM CARBONATE-VITAMIN D PO Take 1 tablet by mouth at bedtime.    02/10/2022   citalopram (CELEXA) 20 MG tablet Take 20 mg by mouth daily.   02/11/2022   cyanocobalamin (VITAMIN B12) 1000 MCG/ML injection Inject 1,000 mcg into the muscle once.   Past Month   diclofenac Sodium (VOLTAREN) 1 % GEL Apply 2 g topically 4 (four) times daily.   Past Week   dorzolamide (TRUSOPT) 2 % ophthalmic solution Place 1 drop into the right eye 2 (two) times daily.  02/11/2022   insulin glargine (LANTUS) 100 UNIT/ML injection Inject 15 Units into the skin as needed.    02/10/2022   latanoprost (XALATAN) 0.005 % ophthalmic solution Place 1 drop into the right eye at bedtime.    02/10/2022   lisinopril (PRINIVIL,ZESTRIL) 5 MG tablet Take 5 mg by mouth at bedtime.    02/10/2022   Omega-3 Fatty Acids (FISH OIL PO) Take 1 capsule by mouth daily.    02/10/2022   timolol (BETIMOL) 0.5 % ophthalmic solution Place 1 drop into both eyes 2 (two) times daily.   02/10/2022   timolol (TIMOPTIC) 0.5 % ophthalmic solution Place 1 drop into both eyes daily.   02/10/2022   traZODone (DESYREL) 50 MG tablet Take 50 mg by  mouth at bedtime.   02/10/2022   vitamin B-12 (CYANOCOBALAMIN) 1000 MCG tablet Take 1,000 mcg by mouth daily.    02/11/2022   Fluorouracil 5 % SOLN Apply 1 application topically daily. X 2 weeks to scalp. To start August 13, 2019 (Patient not taking: Reported on 02/11/2022) 10 mL 1 Not Taking   insulin aspart (NOVOLOG) 100 UNIT/ML injection Inject 30-50 Units into the skin daily. In pump.  Basil and bolus before each meal   pump   levothyroxine (SYNTHROID) 75 MCG tablet Take 75 mcg by mouth daily before breakfast.  (Patient not taking: Reported on 02/11/2022)   Not Taking   simvastatin (ZOCOR) 40 MG tablet Take 40 mg by mouth at bedtime.  (Patient not taking: Reported on 02/11/2022)   Not Taking   traMADol (ULTRAM) 50 MG tablet Take 1 tablet (50 mg total) by mouth every 6 (six) hours as needed. (Patient not taking: Reported on 02/11/2022) 15 tablet 0 Not Taking   Social History   Socioeconomic History   Marital status: Married    Spouse name: Not on file   Number of children: Not on file   Years of education: Not on file   Highest education level: Not on file  Occupational History   Occupation: retired  Tobacco Use   Smoking status: Former    Years: 30.00    Types: Cigarettes    Quit date: 1971    Years since quitting: 52.9   Smokeless tobacco: Never  Vaping Use   Vaping Use: Never used  Substance and Sexual Activity   Alcohol use: Never   Drug use: Never   Sexual activity: Not on file  Other Topics Concern   Not on file  Social History Narrative   Not on file   Social Determinants of Health   Financial Resource Strain: Not on file  Food Insecurity: No Food Insecurity (02/11/2022)   Hunger Vital Sign    Worried About Running Out of Food in the Last Year: Never true    Ran Out of Food in the Last Year: Never true  Transportation Needs: No Transportation Needs (02/11/2022)   PRAPARE - Administrator, Civil Service (Medical): No    Lack of Transportation (Non-Medical): No   Physical Activity: Not on file  Stress: Not on file  Social Connections: Not on file  Intimate Partner Violence: Not At Risk (02/11/2022)   Humiliation, Afraid, Rape, and Kick questionnaire    Fear of Current or Ex-Partner: No    Emotionally Abused: No    Physically Abused: No    Sexually Abused: No    Family History  Problem Relation Age of Onset   Heart disease Mother    CAD Brother  Intake/Output Summary (Last 24 hours) at 02/12/2022 0926 Last data filed at 02/12/2022 0900 Gross per 24 hour  Intake 4667.52 ml  Output 200 ml  Net 4467.52 ml    Vitals:   02/12/22 0600 02/12/22 0700 02/12/22 0800 02/12/22 0900  BP: 106/60 124/65 99/61 (!) 137/92  Pulse: 73 65 71 67  Resp: 20 20 19  (!) 2  Temp:   98 F (36.7 C)   TempSrc:   Oral   SpO2: 100% 100% 100% 100%  Weight:      Height:        PHYSICAL EXAM General: Pleasant elderly Caucasian male, well nourished, in no acute distress.  Sitting upright in ICU bed, 2 daughters at bedside HEENT:  Normocephalic and atraumatic. Neck:  No JVD.  Lungs: Normal respiratory effort on room air. Clear bilaterally to auscultation. No wheezes, crackles, rhonchi.  Heart: HRRR . Normal S1 and S2 without gallops or murmurs.  Abdomen: Non-distended appearing.  Msk: Normal strength and tone for age. Extremities: Warm and well perfused. No clubbing, cyanosis.  No significant peripheral edema.  Neuro: Alert and oriented X 3. Psych:  Answers questions appropriately.   Labs: Basic Metabolic Panel: Recent Labs    02/11/22 2137 02/12/22 0105  NA 135 135  K 3.5 3.4*  CL 102 105  CO2 23 24  GLUCOSE 353* 189*  BUN 39* 37*  CREATININE 1.94* 1.75*  CALCIUM 9.0 8.7*   Liver Function Tests: Recent Labs    02/11/22 0846  AST 26  ALT 14  ALKPHOS 119  BILITOT 1.3*  PROT 7.1  ALBUMIN 3.7   No results for input(s): "LIPASE", "AMYLASE" in the last 72 hours. CBC: Recent Labs    02/11/22 0846 02/12/22 0310  WBC 11.9* 14.7*  HGB  12.0* 10.3*  HCT 37.8* 30.1*  MCV 94.5 87.5  PLT 228 201   Cardiac Enzymes: Recent Labs    02/11/22 1736 02/11/22 1956 02/11/22 2137  TROPONINIHS 12,906* 15,479* 13,778*   BNP: No results for input(s): "BNP" in the last 72 hours. D-Dimer: No results for input(s): "DDIMER" in the last 72 hours. Hemoglobin A1C: No results for input(s): "HGBA1C" in the last 72 hours. Fasting Lipid Panel: Recent Labs    02/12/22 0310  CHOL 121  HDL 59  LDLCALC 56  TRIG 32  CHOLHDL 2.1   Thyroid Function Tests: No results for input(s): "TSH", "T4TOTAL", "T3FREE", "THYROIDAB" in the last 72 hours.  Invalid input(s): "FREET3" Anemia Panel: No results for input(s): "VITAMINB12", "FOLATE", "FERRITIN", "TIBC", "IRON", "RETICCTPCT" in the last 72 hours.   Radiology: DG Chest 2 View  Result Date: 02/11/2022 CLINICAL DATA:  Dizziness and weakness for 2 days. EXAM: CHEST - 2 VIEW COMPARISON:  June 19, 2019 FINDINGS: The heart size and mediastinal contours are stable. Cardiac pacemaker is stable. No focal infiltrate, pulmonary edema, or pleural effusion noted. The visualized skeletal structures are stable. IMPRESSION: No active cardiopulmonary disease. Electronically Signed   By: Sherian ReinWei-Chen  Lin M.D.   On: 02/11/2022 09:58   CT HEAD WO CONTRAST (5MM)  Result Date: 02/11/2022 CLINICAL DATA:  Mental status change with unknown cause EXAM: CT HEAD WITHOUT CONTRAST TECHNIQUE: Contiguous axial images were obtained from the base of the skull through the vertex without intravenous contrast. RADIATION DOSE REDUCTION: This exam was performed according to the departmental dose-optimization program which includes automated exposure control, adjustment of the mA and/or kV according to patient size and/or use of iterative reconstruction technique. COMPARISON:  05/25/2013 FINDINGS: Brain: No evidence of  acute infarction, hemorrhage, hydrocephalus, extra-axial collection or mass lesion/mass effect. Generalized atrophy that  is progressed from 2015. Chronic small vessel ischemia in the cerebral white matter. Vascular: No hyperdense vessel or unexpected calcification. Skull: Normal. Negative for fracture or focal lesion. Sinuses/Orbits: Bilateral cataract resection. IMPRESSION: 1. No acute finding. 2. Generalized atrophy that is progressed from 2015. Electronically Signed   By: Tiburcio Pea M.D.   On: 02/11/2022 09:32     TELEMETRY reviewed by me (LT) 02/12/2022 : Sinus rhythm 60s to 70s with periods of atrial pacing  EKG reviewed by me: Sinus rhythm with diffuse ST depressions  Data reviewed by me (LT) 02/12/2022: ED Strong admission H&P CBC BMP vitals telemetry EKGs echo reports  Principal Problem:   DKA (diabetic ketoacidosis) (HCC) Active Problems:   Essential hypertension   Abnormal EKG   Hypothyroidism   Coronary artery disease   Acute renal failure superimposed on stage 3a chronic kidney disease (HCC)   Hyperkalemia   HLD (hyperlipidemia)   Leukocytosis   Numbness and tingling of right arm   Type II diabetes mellitus with renal manifestations (HCC)   NSTEMI (non-ST elevated myocardial infarction) (HCC)   Depression    ASSESSMENT AND PLAN:  Thurl D. Strong is an 71yoM with a PMH of T1DM, SSS s/p Medtronic DC PPM, HFpEF (55-60% 06/2019), CAD with hx PCI 2006, CKD 3, OSA who presented to [facility] ED 03/10/2022 with significant weakness, severe dizziness decreased responsiveness per his daughter.  He has had some issues with his insulin pump, and on arrival his blood glucose was 864 with an anion gap of 22, beta hydroxybutyric acid 4.13.  He was treated for DKA and his anion gap closed the morning of 12/4.  Cardiology is consulted because of his elevated troponin which peaked at 15000 and echo showed a reduced ejection fraction to 45-50%, consistent with NSTEMI.  # DKA  Anion gap closed, he is now tolerating clear liquids, and is eager to eat more. -Management per primary team  # NSTEMI # CAD with history  of PCI 2006 Troponin peaked at 15,000, EKGs with diffuse ST depression.  No chest pain.  Echo this admission showed a reduction in his ejection fraction from 55-60% to 45-50%.  Discussed at length with the patient and his 2 daughters the risks and benefit of pursuing a left heart cath versus continuing conservative medical management from a cardiac standpoint.  His daughter does Strong that the patient has had significant functional decline since April of this year following the passing of his wife, and he is elected to go home with hospice.  They are in agreement to continue conservative medical management at this time, but know that should he develop chest pain or discomfort, we may proceed with left heart cath. -S/p 325 mg aspirin -Continue aspirin 81 mg and clopidogrel 75 mg daily for at least 6 months, probably longer -Continue atorvastatin 80 mg once daily -Consider addition of metoprolol XL 12.5 mg once daily tomorrow morning, if blood pressure allows -Defer ACE, ARB, ARNI, MRA with renal insufficiency and relative hypotension. -He is not appropriate for cardiac rehab, going home with hospice.  #SSS s/p Medtronic dual-chamber PPM Appears to be functioning appropriately on telemetry, A paced on telemetry.  This patient's plan of care was discussed and created with Dr. Juliann Pares and he is in agreement.  Signed: Rebeca Allegra , PA-C 02/12/2022, 9:26 AM [facility]

## 2022-02-12 NOTE — Inpatient Diabetes Management (Addendum)
Inpatient Diabetes Program Recommendations  AACE/ADA: New Consensus Statement on Inpatient Glycemic Control  Target Ranges:  Prepandial:   less than 140 mg/dL      Peak postprandial:   less than 180 mg/dL (1-2 hours)      Critically ill patients:  140 - 180 mg/dL    Latest Reference Range & Units 02/12/22 01:14 02/12/22 02:38 02/12/22 03:35 02/12/22 05:40 02/12/22 06:42 02/12/22 07:34  Glucose-Capillary 70 - 99 mg/dL 810 (H) 175 (H) 102 (H) 124 (H) 121 (H) 147 (H)    Latest Reference Range & Units 02/11/22 08:46 02/12/22 01:05  CO2 22 - 32 mmol/L 14 (L) 24  Glucose 70 - 99 mg/dL 585 (HH) 277 (H)  Anion gap 5 - 15  22 (H) 6   Review of Glycemic Control  Diabetes history: DM1 (makes NO insulin; requires basal, correction, and carb coverage insulin) Outpatient Diabetes medications: [name] with Novolog; Lantus 15 units daily when not using pump Current orders for Inpatient glycemic control: IV insulin  Inpatient Diabetes Program Recommendations:    Insulin: When provider is ready to transition off IV insulin:  A: if patient has all new insulin pump supplies and appropriate to use insulin pump, please order insulin pump order set (with frequency of AC&HS and 2am) and have patient restart insulin pump (will continue IV Insulin for 1 hour and then stop insulin drip).  B. If patient does not have new insulin pump supplies, would recommend to transition to [name] 10 units Q24H, CBGs Q4H, Novolog 0-9 units Q4H, and Novolog 3 units TID with meals for meal coverage if pateint eats at least 50% of meals.  NOTE: Patient admitted with DKA. Per chart reivew, patient sees [name] and was last seen 11/22/21. Per office note on 11/22/21, the following should be insulin pump settings: Basal rates MN = 0.25 3:30 AM = 0.6 4 PM = 0.65 10:30 PM = 0.5 TDD basal: 13.35 units  Bolus settings I/C: 10 at MN, 12 at 4 PM ISF: 55 Target Glucose: 80 Active insulin time: 5 hours   Will  plan to follow up with patient today.  Addendum 02/12/22@13 :45-Spoke with patient at bedside about diabetes and home regimen for diabetes control. Patient reports being followed by [name] and he was last seen 11/22/21. Patient uses [name] along with [name]. Patient reports that he put on a new infusion site on Saturday (02/10/22) and glucose would not come down so he changed the site out on 02/10/22 but it continued to stay high. Patient states he uses the Logan Strong and it was removed in the ED.  Patient state that his Dexcom was bad and he is planning to call company and get replacements for the bad CGM sensor.  Patient reports his daughter charged up his T-slim pump and he now has everything he needs to resume his insulin pump. Patient normally uses the [name] (auto mode) and his glucose usually stays well controlled and A1C is in good range; last A1C was 7%. Discussed that patient has been given [name] and Novolog correction SQ and since the [facility] will work up to 24 hours, we will continue to use SQ insulin for now. Patient reports he is planning to have heart cath tomorrow. Informed patient that our team will follow along and make recommendations as needed; discussed that for now SQ insulin will be continued and patient agreeable.  Patient verbalized understanding of information discussed and reports no further questions at this time  related to diabetes.  Thanks, Logan Penner, RN, MSN, CDCES Diabetes Coordinator Inpatient Diabetes Program [phone] (Team Pager from 8am to 5pm)

## 2022-02-12 NOTE — Progress Notes (Signed)
Pts R pupil larger (4) than the L pupil (2). Pt did say that he has had this issue, (not sure how much he understood of what I was asking) Notified MD, no new orders at this time will continue to monitor.

## 2022-02-12 NOTE — Consult Note (Signed)
ANTICOAGULATION CONSULT NOTE  Pharmacy Consult for heparin infusion - NO BOLUS Indication: chest pain/ACS   No Known Allergies  Patient Measurements: Height: 5\' 6"  (167.6 cm) Weight: 62.3 kg (137 lb 5.6 oz) IBW/kg (Calculated) : 63.8 Heparin Dosing Weight: 59 kg  Vital Signs: Temp: 97.8 F (36.6 C) (12/04 0000) Temp Source: Oral (12/04 0000) BP: 95/55 (12/04 0200) Pulse Rate: 68 (12/04 0200)  Labs: Recent Labs    02/11/22 0846 02/11/22 1050 02/11/22 1736 02/11/22 1956 02/11/22 2137 02/11/22 2247 02/12/22 0105 02/12/22 0310  HGB 12.0*  --   --   --   --   --   --  10.3*  HCT 37.8*  --   --   --   --   --   --  30.1*  PLT 228  --   --   --   --   --   --  201  APTT  --   --  >200*  --   --  105*  --   --   HEPARINUNFRC  --   --   --   --   --   --   --  0.44  CREATININE 2.12*  --  2.07*  --  1.94*  --  1.75*  --   TROPONINIHS 128*   < > 12,906* 15,479* 13,778*  --   --   --    < > = values in this interval not displayed.     Estimated Creatinine Clearance: 25.7 mL/min (A) (by C-G formula based on SCr of 1.75 mg/dL (H)).   Medical History: Past Medical History:  Diagnosis Date   Actinic keratosis    Anemia    Chronic kidney disease    "functioning at 50%"   Coronary artery disease    Diabetes mellitus, type 2 (HCC)    Dysrhythmia    Glaucoma    Hypothyroidism    Osteoporosis    Pneumonia    Sleep apnea    could not tolerate CPAP   Wears dentures    partial upper and lower    Medications:  PTA: aspirin 81 mg PO HS Inpatient:  heparin infusion (12/3 >>) Allergies: NKDA  Assessment: 88 y.o. male who presents to the ER for evaluation of weakness and dizziness started yesterday evening.   Goal of Therapy:  Heparin level 0.3-0.7 units/ml Monitor platelets by anticoagulation protocol: Yes  Date Time aPTT/HL Rate/Comment 12/3 1736 >200 / ---  12/3 2247 105 / --- aPTT trending down 12/4 0310 HL 0.44 Therapeutic x 1  Plan:  Continue heparin  infusion at 700 units/hr Recheck HL in 8 hr to confirm CBC daily while on heparin  14/4, PharmD, [facility] 02/12/2022 4:04 AM

## 2022-02-12 NOTE — Consult Note (Signed)
ANTICOAGULATION CONSULT NOTE  Pharmacy Consult for heparin infusion - NO BOLUS Indication: chest pain/ACS   No Known Allergies  Patient Measurements: Height: 5\' 6"  (167.6 cm) Weight: 62.3 kg (137 lb 5.6 oz) IBW/kg (Calculated) : 63.8 Heparin Dosing Weight: 59 kg  Vital Signs: Temp: 98 F (36.7 C) (12/04 0400) Temp Source: Oral (12/04 0400) BP: 98/48 (12/04 0500) Pulse Rate: 72 (12/04 0400)  Labs: Recent Labs    02/11/22 0846 02/11/22 1050 02/11/22 1736 02/11/22 1956 02/11/22 2137 02/11/22 2247 02/12/22 0105 02/12/22 0310  HGB 12.0*  --   --   --   --   --   --  10.3*  HCT 37.8*  --   --   --   --   --   --  30.1*  PLT 228  --   --   --   --   --   --  201  APTT  --   --  >200*  --   --  105*  --   --   HEPARINUNFRC  --   --   --   --   --   --   --  0.44  CREATININE 2.12*  --  2.07*  --  1.94*  --  1.75*  --   TROPONINIHS 128*   < > 12,906* 15,479* 13,778*  --   --   --    < > = values in this interval not displayed.     Estimated Creatinine Clearance: 25.7 mL/min (A) (by C-G formula based on SCr of 1.75 mg/dL (H)).   Medical History: Past Medical History:  Diagnosis Date   Actinic keratosis    Anemia    Chronic kidney disease    "functioning at 50%"   Coronary artery disease    Diabetes mellitus, type 2 (HCC)    Dysrhythmia    Glaucoma    Hypothyroidism    Osteoporosis    Pneumonia    Sleep apnea    could not tolerate CPAP   Wears dentures    partial upper and lower    Medications:  PTA: aspirin 81 mg PO HS Inpatient:  heparin infusion (12/3 >>) Allergies: NKDA  Assessment: 88 y.o. male who presents to the ER for evaluation of weakness and dizziness. A review of medical records reveals no chronic anticoagulation prior to arrival   Goal of Therapy:  Heparin level 0.3-0.7 units/ml Monitor platelets by anticoagulation protocol: Yes  Plan: heparin level therapeutic x 2 --continue heparin infusion at 700 units/hr --Recheck heparin level once  daily, next am 12/05 --CBC daily while on heparin  14/05, PharmD, BCPS 02/12/2022 7:09 AM

## 2022-02-12 NOTE — Progress Notes (Signed)
       CROSS COVER NOTE  NAME: Logan Strong MRN: [id number] DOB : [birth year] ATTENDING PHYSICIAN: [name], MD    Date of Service   02/12/2022   HPI/Events of Note   Message received from nursing reporting two patients in same room have cardiac monitoring orders. There is a facility limitation that two patients can not be monitored by CCMD in the same room.   Per chart review Logan Strong daughter requested he have hospice evaluation and his anticipated disposition is home with hospice care tomorrow.  Ectopy seen on review of telemetry, patient has a pacemaker in situ.   Interventions   Assessment/Plan:  D/c cardiac monitoring  Monitor electrolytes    This document was prepared using Dragon voice recognition software and may include unintentional dictation errors.  Bishop Limbo DNP, MBA, FNP-BC Nurse Practitioner [facility] Pager [phone]

## 2022-02-12 NOTE — Progress Notes (Signed)
*  PRELIMINARY RESULTS* Echocardiogram 2D Echocardiogram has been performed.  Logan Strong 02/12/2022, 8:55 AM

## 2022-02-12 NOTE — Assessment & Plan Note (Addendum)
Cardiology was considering cath as his EF has dropped on echo but patient/family is planning hospice at home and not having any active cardiac symptoms cardiac cath has been held off.  Patient/family in agreement

## 2022-02-12 NOTE — Progress Notes (Signed)
Tried calling report, was told to call back in 10-15 min by Diplomatic Services operational officer

## 2022-02-12 NOTE — Assessment & Plan Note (Addendum)
Conservative management plan.  Troponin peaked at 15,479.  Continue heparin drip Family requesting hospice evaluation

## 2022-02-12 NOTE — Progress Notes (Signed)
[facility] IC[facility] Civil engineer, contracting [facility]) Hospital Liaison Note  Received request from Transitions of Care Manager, Charisse March, for hospice services at home after discharge. Chart and patient information under review by [facility] physician.   Spoke with patient & child/Dina to initiate education related to hospice philosophy, services, and team approach to care. Both verbalized understanding of information given. Per discussion, the plan is for patient to discharge home via POV once cleared to DC.    DME needs discussed. Patient has the following equipment in the home (Purchased privately): [employer] risk analyst Patient requests the following equipment for delivery: N/A  Address verified and is correct in the chart.   Please send signed and completed DNR home with patient/family. Please provide prescriptions at discharge as needed to ensure ongoing symptom management.    [facility] information and contact numbers given to family & above information shared with TOC.   Please call with any questions/concerns.    Thank you for the opportunity to participate in this patient's care.   Odette Fraction, MSW [facility] Liaison  [phone]

## 2022-02-13 DIAGNOSIS — F32A Depression, unspecified: Secondary | ICD-10-CM

## 2022-02-13 LAB — BASIC METABOLIC PANEL
Anion gap: 6 (ref 5–15)
BUN: 27 mg/dL — ABNORMAL HIGH (ref 8–23)
CO2: 24 mmol/L (ref 22–32)
Calcium: 8.8 mg/dL — ABNORMAL LOW (ref 8.9–10.3)
Chloride: 107 mmol/L (ref 98–111)
Creatinine, Ser: 1.44 mg/dL — ABNORMAL HIGH (ref 0.61–1.24)
GFR, Estimated: 47 mL/min — ABNORMAL LOW (ref >60)
Glucose, Bld: 193 mg/dL — ABNORMAL HIGH (ref 70–99)
Potassium: 4.4 mmol/L (ref 3.5–5.1)
Sodium: 137 mmol/L (ref 135–145)

## 2022-02-13 LAB — GLUCOSE, CAPILLARY
Glucose-Capillary: 140 mg/dL — ABNORMAL HIGH (ref 70–99)
Glucose-Capillary: 178 mg/dL — ABNORMAL HIGH (ref 70–99)
Glucose-Capillary: 293 mg/dL — ABNORMAL HIGH (ref 70–99)
Glucose-Capillary: 315 mg/dL — ABNORMAL HIGH (ref 70–99)

## 2022-02-13 LAB — CBC
HCT: 33.3 % — ABNORMAL LOW (ref 39.0–52.0)
Hemoglobin: 10.9 g/dL — ABNORMAL LOW (ref 13.0–17.0)
MCH: 29.2 pg (ref 26.0–34.0)
MCHC: 32.7 g/dL (ref 30.0–36.0)
MCV: 89.3 fL (ref 80.0–100.0)
Platelets: 179 10*3/uL (ref 150–400)
RBC: 3.73 MIL/uL — ABNORMAL LOW (ref 4.22–5.81)
RDW: 13.5 % (ref 11.5–15.5)
WBC: 8.3 10*3/uL (ref 4.0–10.5)
nRBC: 0 % (ref 0.0–0.2)

## 2022-02-13 LAB — HEPARIN LEVEL (UNFRACTIONATED): Heparin Unfractionated: 0.19 IU/mL — ABNORMAL LOW (ref 0.30–0.70)

## 2022-02-13 SURGERY — LEFT HEART CATH AND CORONARY ANGIOGRAPHY
Anesthesia: Moderate Sedation

## 2022-02-13 MED ORDER — INSULIN PUMP
Freq: Three times a day (TID) | SUBCUTANEOUS | Status: DC
  Filled 2022-02-13: qty 1

## 2022-02-13 MED ORDER — INSULIN ASPART 100 UNIT/ML IJ SOLN
100.0000 [IU] | Freq: Once | INTRAMUSCULAR | Status: AC
  Administered 2022-02-13: 100 [IU] via SUBCUTANEOUS
  Filled 2022-02-13: qty 1

## 2022-02-13 MED ORDER — CLOPIDOGREL BISULFATE 75 MG PO TABS: 75.0000 mg | ORAL_TABLET | Freq: Every day | ORAL | 6 refills | Status: AC

## 2022-02-13 NOTE — Inpatient Diabetes Management (Addendum)
Inpatient Diabetes Program Recommendations  AACE/ADA: New Consensus Statement on Inpatient Glycemic Control   Target Ranges:  Prepandial:   less than 140 mg/dL      Peak postprandial:   less than 180 mg/dL (1-2 hours)      Critically ill patients:  140 - 180 mg/dL    Latest Reference Range & Units 02/13/22 03:42 02/13/22 07:40  Glucose-Capillary 70 - 99 mg/dL 761 (H) 607 (H)    Latest Reference Range & Units 02/12/22 07:34 02/12/22 08:29 02/12/22 09:44 02/12/22 10:27 02/12/22 11:29 02/12/22 16:07 02/12/22 20:17 02/12/22 23:29  Glucose-Capillary 70 - 99 mg/dL 371 (H) 062 (H) 694 (H) 136 (H) 142 (H) 127 (H) 111 (H) 113 (H)   Review of Glycemic Control  Diabetes history: DM1 (makes NO insulin; requires basal, correction, and carb coverage insulin) Outpatient Diabetes medications: [name] with Novolog; Lantus 15 units daily when not using pump Current orders for Inpatient glycemic control: [name] 10 units daily, Novolog 3 units TID with meals, Novolog 0-9 units Q4H  Inpatient Diabetes Program Recommendations:    Insulin: Please discontinue [name] and Novolog orders and use Insulin Pump order set to order CBGs AC&HS and 2am and Insulin Pump AC&HS and 2am.   NOTE: Per chart, plan is to discharge patient home to day with hospice following. Communicated with Dr. Sherryll Burger and the plan is to get patient restarted on his insulin pump today. Patient should have everything he needs to get insulin pump restarted. Will ask nursing to let patient know and have  him go ahead and get insulin pump restarted.  Addendum 02/13/22@12 :15-Spoke with patient at bedside. Patient's daughter at bedside as well. Patient confirms that he started his insulin pump around 9:30 am today. Patient states that he gave himself 1 unit of insulin when he restarted the insulin pump. NT in room to check glucose during conversation and current glucose 293 mg/dl. Asked patient to do a correction for glucose of 293 mg/dl which  patient did and the pump bolused him 2.86 units.  Patient does not have on Dexcom CGM so not able to use Control IQ (auto mode) at this time. Patient does not have new Dexcom sensor with him here at the hospital but plans to get Dexcom on as soon as he gets home. Encouraged patient to continue to bolus for glucose and carbs while inpatient. Patient reports he is suppose to be discharged home today. Patient verbalized understanding of information and has no questions at this time. Spoke with Silva Bandy, RN regarding patient's glucose and that he gave himself a bolus of 2.86 units at 12:10.   Thanks, Orlando Penner, RN, MSN, CDCES Diabetes Coordinator Inpatient Diabetes Program [phone] (Team Pager from 8am to 5pm)

## 2022-02-13 NOTE — Progress Notes (Signed)
   02/13/22 0940  Insulin Pump Admission Assessment  Is patient suicidal? No  Patient desires continuing Insulin Pump Therapy Yes  Patient agrees to use hospital CBG meter Yes  Patient alert and oriented x 3 Yes  Patient can use hands/fingers to adjust pump settings Yes  Patient able to manage insulin pump Yes  Inuslin Pump Shift/PRN Assessment  Insulin pump functioning Yes  Infusion set connections secure/intact Yes  Signs and symptoms of infection at insertion site No  Date set/site changed (every 72 hours and prn) 02/13/22  Insulin pump set/site location RLQ  Pump managed by Patient  Patient has pump supplies at bedside Yes

## 2022-02-13 NOTE — Progress Notes (Signed)
AVS given and reviewed with pt and pt's daughter, Eunice Blase, at bedside. Medications discussed. All questions answered to satisfaction. Pt and daughter verbalized understanding of information given. Pt to be escorted off the unit with all belongings via wheelchair by staff member.

## 2022-02-13 NOTE — Consult Note (Signed)
ANTICOAGULATION CONSULT NOTE  Pharmacy Consult for heparin infusion - NO BOLUS Indication: chest pain/ACS   No Known Allergies  Patient Measurements: Height: 5\' 6"  (167.6 cm) Weight: 62.3 kg (137 lb 5.6 oz) IBW/kg (Calculated) : 63.8 Heparin Dosing Weight: 59 kg  Vital Signs: Temp: 97.5 F (36.4 C) (12/05 0335) Temp Source: Oral (12/05 0335) BP: 132/82 (12/05 0335) Pulse Rate: 75 (12/05 0335)  Labs: Recent Labs    02/11/22 0846 02/11/22 1050 02/11/22 1736 02/11/22 1956 02/11/22 2137 02/11/22 2247 02/12/22 0105 02/12/22 0310 02/12/22 1144 02/13/22 0432  HGB 12.0*  --   --   --   --   --   --  10.3*  --  10.9*  HCT 37.8*  --   --   --   --   --   --  30.1*  --  33.3*  PLT 228  --   --   --   --   --   --  201  --  179  APTT  --   --  >200*  --   --  105*  --   --   --   --   HEPARINUNFRC  --   --   --   --   --   --   --  0.44 0.37 0.19*  CREATININE 2.12*  --  2.07*  --  1.94*  --  1.75*  --   --  1.44*  TROPONINIHS 128*   < > 12,906* 15,479* 13,778*  --   --   --   --   --    < > = values in this interval not displayed.     Estimated Creatinine Clearance: 31.2 mL/min (A) (by C-G formula based on SCr of 1.44 mg/dL (H)).   Medical History: Past Medical History:  Diagnosis Date   Actinic keratosis    Anemia    Chronic kidney disease    "functioning at 50%"   Coronary artery disease    Diabetes mellitus, type 2 (HCC)    Dysrhythmia    Glaucoma    Hypothyroidism    Osteoporosis    Pneumonia    Sleep apnea    could not tolerate CPAP   Wears dentures    partial upper and lower    Medications:  PTA: aspirin 81 mg PO HS Inpatient:  heparin infusion (12/3 >>) Allergies: NKDA  Assessment: 88 y.o. male who presents to the ER for evaluation of weakness and dizziness. A review of medical records reveals no chronic anticoagulation prior to arrival   Goal of Therapy:  Heparin level 0.3-0.7 units/ml Monitor platelets by anticoagulation protocol: Yes  Plan:   12/5:  HL @ 0432 = 0.19, SUBtherapeutic  Will increase drip rate to 900 units/hr and recheck HL 8 hrs after rate change.   Aniqua Briere D 02/13/2022 6:24 AM

## 2022-02-13 NOTE — Progress Notes (Signed)
Pt changed insulin pump himself and placed to RLQ.

## 2022-02-13 NOTE — Plan of Care (Signed)
  Problem: Activity: Goal: Risk for activity intolerance will decrease Outcome: Progressing   Problem: Elimination: Goal: Will not experience complications related to bowel motility Outcome: Progressing Goal: Will not experience complications related to urinary retention Outcome: Progressing   Problem: Pain Managment: Goal: General experience of comfort will improve Outcome: Progressing   Problem: Safety: Goal: Ability to remain free from injury will improve Outcome: Progressing   Problem: Skin Integrity: Goal: Risk for impaired skin integrity will decrease Outcome: Progressing   

## 2022-02-14 ENCOUNTER — Other Ambulatory Visit: Payer: Self-pay

## 2022-02-14 ENCOUNTER — Inpatient Hospital Stay: Payer: Medicare HMO

## 2022-02-14 ENCOUNTER — Inpatient Hospital Stay
Admission: EM | Admit: 2022-02-14 | Discharge: 2022-02-16 | DRG: 919 | Disposition: A | Discharge status: Hospice | Payer: Medicare HMO | Attending: Obstetrics and Gynecology | Admitting: Obstetrics and Gynecology

## 2022-02-14 ENCOUNTER — Emergency Department: Payer: Medicare HMO

## 2022-02-14 DIAGNOSIS — I130 Hypertensive heart and chronic kidney disease with heart failure and stage 1 through stage 4 chronic kidney disease, or unspecified chronic kidney disease: Secondary | ICD-10-CM | POA: Diagnosis not present

## 2022-02-14 DIAGNOSIS — E1110 Type 2 diabetes mellitus with ketoacidosis without coma: Principal | ICD-10-CM

## 2022-02-14 DIAGNOSIS — I2510 Atherosclerotic heart disease of native coronary artery without angina pectoris: Secondary | ICD-10-CM | POA: Diagnosis not present

## 2022-02-14 DIAGNOSIS — E860 Dehydration: Secondary | ICD-10-CM | POA: Diagnosis present

## 2022-02-14 DIAGNOSIS — M810 Age-related osteoporosis without current pathological fracture: Secondary | ICD-10-CM | POA: Diagnosis present

## 2022-02-14 DIAGNOSIS — H409 Unspecified glaucoma: Secondary | ICD-10-CM | POA: Diagnosis present

## 2022-02-14 DIAGNOSIS — T85694A Other mechanical complication of insulin pump, initial encounter: Principal | ICD-10-CM | POA: Diagnosis present

## 2022-02-14 DIAGNOSIS — E785 Hyperlipidemia, unspecified: Secondary | ICD-10-CM | POA: Diagnosis present

## 2022-02-14 DIAGNOSIS — I255 Ischemic cardiomyopathy: Secondary | ICD-10-CM | POA: Diagnosis present

## 2022-02-14 DIAGNOSIS — I10 Essential (primary) hypertension: Secondary | ICD-10-CM | POA: Diagnosis present

## 2022-02-14 DIAGNOSIS — Z66 Do not resuscitate: Secondary | ICD-10-CM | POA: Diagnosis not present

## 2022-02-14 DIAGNOSIS — I959 Hypotension, unspecified: Secondary | ICD-10-CM | POA: Diagnosis not present

## 2022-02-14 DIAGNOSIS — Z7982 Long term (current) use of aspirin: Secondary | ICD-10-CM

## 2022-02-14 DIAGNOSIS — F32A Depression, unspecified: Secondary | ICD-10-CM | POA: Diagnosis present

## 2022-02-14 DIAGNOSIS — I420 Dilated cardiomyopathy: Secondary | ICD-10-CM | POA: Diagnosis not present

## 2022-02-14 DIAGNOSIS — Z950 Presence of cardiac pacemaker: Secondary | ICD-10-CM | POA: Insufficient documentation

## 2022-02-14 DIAGNOSIS — R339 Retention of urine, unspecified: Secondary | ICD-10-CM | POA: Diagnosis present

## 2022-02-14 DIAGNOSIS — I214 Non-ST elevation (NSTEMI) myocardial infarction: Secondary | ICD-10-CM | POA: Diagnosis not present

## 2022-02-14 DIAGNOSIS — I495 Sick sinus syndrome: Secondary | ICD-10-CM | POA: Insufficient documentation

## 2022-02-14 DIAGNOSIS — E039 Hypothyroidism, unspecified: Secondary | ICD-10-CM | POA: Diagnosis present

## 2022-02-14 DIAGNOSIS — N179 Acute kidney failure, unspecified: Secondary | ICD-10-CM | POA: Diagnosis present

## 2022-02-14 DIAGNOSIS — R531 Weakness: Secondary | ICD-10-CM | POA: Diagnosis not present

## 2022-02-14 DIAGNOSIS — Z955 Presence of coronary angioplasty implant and graft: Secondary | ICD-10-CM

## 2022-02-14 DIAGNOSIS — D631 Anemia in chronic kidney disease: Secondary | ICD-10-CM | POA: Diagnosis present

## 2022-02-14 DIAGNOSIS — Z794 Long term (current) use of insulin: Secondary | ICD-10-CM | POA: Diagnosis not present

## 2022-02-14 DIAGNOSIS — Z7902 Long term (current) use of antithrombotics/antiplatelets: Secondary | ICD-10-CM

## 2022-02-14 DIAGNOSIS — E875 Hyperkalemia: Secondary | ICD-10-CM | POA: Diagnosis present

## 2022-02-14 DIAGNOSIS — Z961 Presence of intraocular lens: Secondary | ICD-10-CM | POA: Diagnosis present

## 2022-02-14 DIAGNOSIS — Z515 Encounter for palliative care: Secondary | ICD-10-CM | POA: Diagnosis not present

## 2022-02-14 DIAGNOSIS — E1111 Type 2 diabetes mellitus with ketoacidosis with coma: Secondary | ICD-10-CM | POA: Diagnosis present

## 2022-02-14 DIAGNOSIS — G4730 Sleep apnea, unspecified: Secondary | ICD-10-CM | POA: Diagnosis present

## 2022-02-14 DIAGNOSIS — S2243XA Multiple fractures of ribs, bilateral, initial encounter for closed fracture: Secondary | ICD-10-CM | POA: Diagnosis not present

## 2022-02-14 DIAGNOSIS — R7989 Other specified abnormal findings of blood chemistry: Secondary | ICD-10-CM | POA: Diagnosis present

## 2022-02-14 DIAGNOSIS — Z79891 Long term (current) use of opiate analgesic: Secondary | ICD-10-CM

## 2022-02-14 DIAGNOSIS — E1010 Type 1 diabetes mellitus with ketoacidosis without coma: Secondary | ICD-10-CM | POA: Diagnosis not present

## 2022-02-14 DIAGNOSIS — E871 Hypo-osmolality and hyponatremia: Secondary | ICD-10-CM | POA: Diagnosis present

## 2022-02-14 DIAGNOSIS — Z8249 Family history of ischemic heart disease and other diseases of the circulatory system: Secondary | ICD-10-CM

## 2022-02-14 DIAGNOSIS — I5032 Chronic diastolic (congestive) heart failure: Secondary | ICD-10-CM | POA: Diagnosis not present

## 2022-02-14 DIAGNOSIS — Z79899 Other long term (current) drug therapy: Secondary | ICD-10-CM

## 2022-02-14 DIAGNOSIS — T383X6A Underdosing of insulin and oral hypoglycemic [antidiabetic] drugs, initial encounter: Secondary | ICD-10-CM | POA: Diagnosis present

## 2022-02-14 DIAGNOSIS — Z87891 Personal history of nicotine dependence: Secondary | ICD-10-CM

## 2022-02-14 DIAGNOSIS — Z7983 Long term (current) use of bisphosphonates: Secondary | ICD-10-CM

## 2022-02-14 DIAGNOSIS — Z9641 Presence of insulin pump (external) (internal): Secondary | ICD-10-CM | POA: Diagnosis present

## 2022-02-14 DIAGNOSIS — E1022 Type 1 diabetes mellitus with diabetic chronic kidney disease: Secondary | ICD-10-CM | POA: Diagnosis present

## 2022-02-14 DIAGNOSIS — N1831 Chronic kidney disease, stage 3a: Secondary | ICD-10-CM | POA: Diagnosis present

## 2022-02-14 DIAGNOSIS — Z79890 Hormone replacement therapy (postmenopausal): Secondary | ICD-10-CM

## 2022-02-14 LAB — CBG MONITORING, ED
Glucose-Capillary: 571 mg/dL (ref 70–99)
Glucose-Capillary: 532 mg/dL (ref 70–99)
Glucose-Capillary: 510 mg/dL (ref 70–99)
Glucose-Capillary: 568 mg/dL (ref 70–99)
Glucose-Capillary: 412 mg/dL — ABNORMAL HIGH (ref 70–99)
Glucose-Capillary: 495 mg/dL — ABNORMAL HIGH (ref 70–99)
Glucose-Capillary: 449 mg/dL — ABNORMAL HIGH (ref 70–99)
Glucose-Capillary: 367 mg/dL — ABNORMAL HIGH (ref 70–99)
Glucose-Capillary: 410 mg/dL — ABNORMAL HIGH (ref 70–99)
Glucose-Capillary: 292 mg/dL — ABNORMAL HIGH (ref 70–99)
Glucose-Capillary: 234 mg/dL — ABNORMAL HIGH (ref 70–99)

## 2022-02-14 LAB — BASIC METABOLIC PANEL
Anion gap: 25 — ABNORMAL HIGH (ref 5–15)
Anion gap: 9 (ref 5–15)
BUN: 44 mg/dL — ABNORMAL HIGH (ref 8–23)
BUN: 44 mg/dL — ABNORMAL HIGH (ref 8–23)
CO2: 10 mmol/L — ABNORMAL LOW (ref 22–32)
CO2: 21 mmol/L — ABNORMAL LOW (ref 22–32)
Calcium: 8.9 mg/dL (ref 8.9–10.3)
Calcium: 8.7 mg/dL — ABNORMAL LOW (ref 8.9–10.3)
Chloride: 96 mmol/L — ABNORMAL LOW (ref 98–111)
Chloride: 104 mmol/L (ref 98–111)
Creatinine, Ser: 2.12 mg/dL — ABNORMAL HIGH (ref 0.61–1.24)
Creatinine, Ser: 1.93 mg/dL — ABNORMAL HIGH (ref 0.61–1.24)
GFR, Estimated: 29 mL/min — ABNORMAL LOW (ref >60)
GFR, Estimated: 33 mL/min — ABNORMAL LOW (ref >60)
Glucose, Bld: 694 mg/dL (ref 70–99)
Glucose, Bld: 284 mg/dL — ABNORMAL HIGH (ref 70–99)
Potassium: 6.3 mmol/L (ref 3.5–5.1)
Potassium: 3.7 mmol/L (ref 3.5–5.1)
Sodium: 131 mmol/L — ABNORMAL LOW (ref 135–145)
Sodium: 134 mmol/L — ABNORMAL LOW (ref 135–145)

## 2022-02-14 LAB — BETA-HYDROXYBUTYRIC ACID
Beta-Hydroxybutyric Acid: >8 mmol/L — ABNORMAL HIGH (ref 0.05–0.27)
Beta-Hydroxybutyric Acid: 1.34 mmol/L — ABNORMAL HIGH (ref 0.05–0.27)

## 2022-02-14 LAB — CBC WITH DIFFERENTIAL/PLATELET
Abs Immature Granulocytes: 0.13 10*3/uL — ABNORMAL HIGH (ref 0.00–0.07)
Basophils Absolute: 0.1 10*3/uL (ref 0.0–0.1)
Basophils Relative: 1 %
Eosinophils Absolute: 0 10*3/uL (ref 0.0–0.5)
Eosinophils Relative: 0 %
HCT: 38.2 % — ABNORMAL LOW (ref 39.0–52.0)
Hemoglobin: 12 g/dL — ABNORMAL LOW (ref 13.0–17.0)
Immature Granulocytes: 1 %
Lymphocytes Relative: 4 %
Lymphs Abs: 0.4 10*3/uL — ABNORMAL LOW (ref 0.7–4.0)
MCH: 30 pg (ref 26.0–34.0)
MCHC: 31.4 g/dL (ref 30.0–36.0)
MCV: 95.5 fL (ref 80.0–100.0)
Monocytes Absolute: 0.5 10*3/uL (ref 0.1–1.0)
Monocytes Relative: 4 %
Neutro Abs: 10.7 10*3/uL — ABNORMAL HIGH (ref 1.7–7.7)
Neutrophils Relative %: 90 %
Platelets: 226 10*3/uL (ref 150–400)
RBC: 4 MIL/uL — ABNORMAL LOW (ref 4.22–5.81)
RDW: 13.7 % (ref 11.5–15.5)
WBC: 11.8 10*3/uL — ABNORMAL HIGH (ref 4.0–10.5)
nRBC: 0 % (ref 0.0–0.2)

## 2022-02-14 LAB — BLOOD GAS, VENOUS
Acid-Base Excess: 0.3 mmol/L (ref 0.0–2.0)
Acid-base deficit: 16 mmol/L — ABNORMAL HIGH (ref 0.0–2.0)
Bicarbonate: 11.3 mmol/L — ABNORMAL LOW (ref 20.0–28.0)
Bicarbonate: 25.4 mmol/L (ref 20.0–28.0)
O2 Saturation: 49.3 %
O2 Saturation: 40.5 %
Patient temperature: 37
Patient temperature: 37
pCO2, Ven: 31 mmHg — ABNORMAL LOW (ref 44–60)
pCO2, Ven: 42 mmHg — ABNORMAL LOW (ref 44–60)
pH, Ven: 7.17 — CL (ref 7.25–7.43)
pH, Ven: 7.39 (ref 7.25–7.43)
pO2, Ven: 33 mmHg (ref 32–45)
pO2, Ven: <31 mmHg — CL (ref 32–45)

## 2022-02-14 LAB — TROPONIN I (HIGH SENSITIVITY)
Troponin I (High Sensitivity): 1775 ng/L (ref <18)
Troponin I (High Sensitivity): 1662 ng/L (ref <18)

## 2022-02-14 LAB — TSH: TSH: 2.223 u[IU]/mL (ref 0.350–4.500)

## 2022-02-14 LAB — T4, FREE: Free T4: 1.2 ng/dL — ABNORMAL HIGH (ref 0.61–1.12)

## 2022-02-14 MED ORDER — DEXTROSE 50 % IV SOLN: 0.0000 mL | INTRAVENOUS | Status: DC | PRN

## 2022-02-14 MED ORDER — OMEGA-3-ACID ETHYL ESTERS 1 G PO CAPS
2.0000 g | ORAL_CAPSULE | Freq: Every day | ORAL | Status: DC
  Administered 2022-02-15 – 2022-02-16 (×2): 2 g via ORAL
  Filled 2022-02-14 (×2): qty 2

## 2022-02-14 MED ORDER — TRAZODONE HCL 50 MG PO TABS
50.0000 mg | ORAL_TABLET | Freq: Every day | ORAL | Status: DC
  Administered 2022-02-14 – 2022-02-15 (×2): 50 mg via ORAL
  Filled 2022-02-14 (×2): qty 1

## 2022-02-14 MED ORDER — CALCIUM GLUCONATE 10 % IV SOLN
1.0000 g | Freq: Once | INTRAVENOUS | Status: AC
  Administered 2022-02-14: 1 g via INTRAVENOUS
  Filled 2022-02-14: qty 10

## 2022-02-14 MED ORDER — ASPIRIN 81 MG PO TBEC
81.0000 mg | DELAYED_RELEASE_TABLET | Freq: Every day | ORAL | Status: DC
  Administered 2022-02-14 – 2022-02-15 (×2): 81 mg via ORAL
  Filled 2022-02-14 (×2): qty 1

## 2022-02-14 MED ORDER — OYSTER SHELL CALCIUM/D3 500-5 MG-MCG PO TABS
1.0000 | ORAL_TABLET | Freq: Every day | ORAL | Status: DC
  Administered 2022-02-14 – 2022-02-15 (×2): 1 via ORAL
  Filled 2022-02-14 (×2): qty 1

## 2022-02-14 MED ORDER — BRIMONIDINE TARTRATE 0.2 % OP SOLN
1.0000 [drp] | Freq: Two times a day (BID) | OPHTHALMIC | Status: DC
  Administered 2022-02-15 – 2022-02-16 (×4): 1 [drp] via OPHTHALMIC
  Filled 2022-02-14 (×2): qty 5

## 2022-02-14 MED ORDER — LEVOTHYROXINE SODIUM 50 MCG PO TABS
75.0000 ug | ORAL_TABLET | Freq: Every day | ORAL | Status: DC
  Administered 2022-02-15 – 2022-02-16 (×2): 75 ug via ORAL
  Filled 2022-02-14: qty 2
  Filled 2022-02-14: qty 1

## 2022-02-14 MED ORDER — LATANOPROST 0.005 % OP SOLN
1.0000 [drp] | Freq: Every day | OPHTHALMIC | Status: DC
  Administered 2022-02-15: 1 [drp] via OPHTHALMIC
  Filled 2022-02-14: qty 2.5

## 2022-02-14 MED ORDER — HEPARIN BOLUS VIA INFUSION
3700.0000 [IU] | Freq: Once | INTRAVENOUS | Status: AC
  Administered 2022-02-14: 3700 [IU] via INTRAVENOUS
  Filled 2022-02-14: qty 3700

## 2022-02-14 MED ORDER — LACTATED RINGERS IV BOLUS
20.0000 mL/kg | Freq: Once | INTRAVENOUS | Status: AC
  Administered 2022-02-14: 1246 mL via INTRAVENOUS

## 2022-02-14 MED ORDER — INSULIN REGULAR(HUMAN) IN NACL 100-0.9 UT/100ML-% IV SOLN
INTRAVENOUS | Status: DC
  Administered 2022-02-14: 5.5 [IU]/h via INTRAVENOUS
  Administered 2022-02-15: 4 [IU]/h via INTRAVENOUS
  Filled 2022-02-14 (×2): qty 100

## 2022-02-14 MED ORDER — ENOXAPARIN SODIUM 30 MG/0.3ML IJ SOSY
30.0000 mg | PREFILLED_SYRINGE | INTRAMUSCULAR | Status: DC
  Administered 2022-02-15: 30 mg via SUBCUTANEOUS
  Filled 2022-02-14: qty 0.3

## 2022-02-14 MED ORDER — LACTATED RINGERS IV SOLN: INTRAVENOUS | Status: DC

## 2022-02-14 MED ORDER — DEXTROSE IN LACTATED RINGERS 5 % IV SOLN: INTRAVENOUS | Status: DC

## 2022-02-14 MED ORDER — INSULIN ASPART 100 UNIT/ML IV SOLN
10.0000 [IU] | Freq: Once | INTRAVENOUS | Status: AC
  Administered 2022-02-14: 10 [IU] via INTRAVENOUS
  Filled 2022-02-14: qty 0.1

## 2022-02-14 MED ORDER — HEPARIN (PORCINE) 25000 UT/250ML-% IV SOLN
900.0000 [IU]/h | INTRAVENOUS | Status: DC
  Administered 2022-02-14: 900 [IU]/h via INTRAVENOUS
  Filled 2022-02-14: qty 250

## 2022-02-14 MED ORDER — DORZOLAMIDE HCL 2 % OP SOLN
1.0000 [drp] | Freq: Two times a day (BID) | OPHTHALMIC | Status: DC
  Administered 2022-02-14 – 2022-02-16 (×4): 1 [drp] via OPHTHALMIC
  Filled 2022-02-14: qty 10

## 2022-02-14 MED ORDER — TIMOLOL MALEATE 0.5 % OP SOLN
1.0000 [drp] | Freq: Every day | OPHTHALMIC | Status: DC
  Administered 2022-02-15 – 2022-02-16 (×2): 1 [drp] via OPHTHALMIC
  Filled 2022-02-14: qty 5

## 2022-02-14 MED ORDER — CITALOPRAM HYDROBROMIDE 20 MG PO TABS
20.0000 mg | ORAL_TABLET | Freq: Every day | ORAL | Status: DC
  Administered 2022-02-15 – 2022-02-16 (×2): 20 mg via ORAL
  Filled 2022-02-14 (×2): qty 1

## 2022-02-14 MED ORDER — CLOPIDOGREL BISULFATE 75 MG PO TABS
75.0000 mg | ORAL_TABLET | Freq: Every day | ORAL | Status: DC
  Administered 2022-02-15 – 2022-02-16 (×2): 75 mg via ORAL
  Filled 2022-02-14 (×2): qty 1

## 2022-02-14 NOTE — Assessment & Plan Note (Signed)
Status post recent non-ST elevation MI that was treated medically. Chart review shows patient's troponin peaked at 15,000 and EKG showed diffuse ST depression. 2D echocardiogram showed a reduction in his LVEF from 55 to 60% to 45 to 50% Patient daughters opted for medical management due to his overall progressive decline Continue aspirin, Plavix and atorvastatin Not on beta-blockers due to relative hypotension

## 2022-02-14 NOTE — Assessment & Plan Note (Addendum)
Last known LVEF of 45 to 50% Patient not on beta-blockers due to relative hypotension Lisinopril is currently on hold due to relative hypotension and AKI Continue aspirin, Plavix and statins

## 2022-02-14 NOTE — Assessment & Plan Note (Signed)
Continue Celexa and trazodone. 

## 2022-02-14 NOTE — ED Provider Triage Note (Signed)
  Emergency Medicine Provider Triage Evaluation Note  Logan Strong, a 88 y.o. male  with a CGM, was evaluated in triage.  Pt complains of hyperglycemia.  He presents to the ED with onset of weakness that he noted this morning.  Patient was discharged last night for a hospital admission secondary to ketoacidosis.  CBG in triage is 571.  Review of Systems  Positive: Hyperglycemia, weakness Negative: FCS  Physical Exam  There were no vitals taken for this visit. Gen:   Awake, no distress  NAD Resp:  Normal effort CTA MSK:   Moves extremities without difficulty  Other:    Medical Decision Making  Medically screening exam initiated at 11:32 AM.  Appropriate orders placed.  Orlean Bradford was informed that the remainder of the evaluation will be completed by another provider, this initial triage assessment does not replace that evaluation, and the importance of remaining in the ED until their evaluation is complete.  Geriatric patient to the ED for evaluation of hyperglycemia and weakness.   Lissa Hoard, PA-C 02/14/22 1143

## 2022-02-14 NOTE — Assessment & Plan Note (Signed)
Secondary to hyperglycemia Expect improvement in serum sodium following resolution of hyperglycemia

## 2022-02-14 NOTE — ED Triage Notes (Addendum)
Patient presents with complaints of weakness that began this morning; Was here recently for ketoacidosis, CBG 571; Was scheduled for heart cath but was started on Plavix instead; Patient is oriented x 4 but nodding off during triage

## 2022-02-14 NOTE — ED Notes (Signed)
Wrote to Dr Joylene Igo re: heparin d/c?Marland Kitchen Awaiting response.

## 2022-02-14 NOTE — Assessment & Plan Note (Signed)
Shows a downward trend and is related to patient's recent non-ST elevation MI Continue medical management

## 2022-02-14 NOTE — Discharge Summary (Signed)
Physician Discharge Summary   Patient: Logan Strong MRN: [id number] DOB: 1931/05/22  Admit date:     02/11/2022  Discharge date: 02/13/2022  Discharge Physician: Max Sane   PCP: Tracie Harrier, MD   Recommendations at discharge:   Follow-up with outpatient providers as requested. Hospice services at home  Discharge Diagnoses: Principal Problem:   DKA (diabetic ketoacidosis) ([place]) Active Problems:   Type II diabetes mellitus with renal manifestations ([place])   Acute renal failure superimposed on stage 3a chronic kidney disease (HCC)   Essential hypertension   Abnormal EKG   Coronary artery disease   Hypothyroidism   HLD (hyperlipidemia)   Leukocytosis   NSTEMI (non-ST elevated myocardial infarction) (HCC)   Hyperkalemia   Numbness and tingling of right arm   Depression   Goals of care, counseling/discussion  Resolved Problems:   * No resolved hospital problems. *  Hospital Course: 88 y.o. male with medical history significant of DM on insulin pump, hypertension, hyperlipidemia, hypothyroidism, CAD with stent placement, CKD stage IIIa, anemia, s/p for pacemaker placement, depression admitted for weakness and was found to have DKA and likely NSTEMI  12/4: Transitioned off insulin drip.  Daughter requesting hospice evaluation.  Assessment and Plan: * DKA (diabetic ketoacidosis) ([place]) Treated with insulin drip and resolved  Acute renal failure superimposed on stage 3a chronic kidney disease ([place]) Lab Results  Component Value Date   CREATININE 1.75 (H) 02/12/2022   CREATININE 1.94 (H) 02/11/2022   CREATININE 2.07 (H) 02/11/2022  At baseline upon discharge  Essential hypertension Coronary artery disease Cardiology was considering cath as his EF has dropped on echo but patient/family is planning hospice at home and not having any active cardiac symptoms cardiac cath has been held off.  Patient/family in agreement  Hypothyroidism Continue levothyroxine  HLD  (hyperlipidemia) Continue Lipitor and Lovaza  NSTEMI (non-ST elevated myocardial infarction) [facility]) Conservative management plan.  Troponin peaked at 15,479.  Treated with heparin drip Patient/family requesting hospice at home has been set up         Consultants: Cardiology Disposition: Hospice care Diet recommendation:  Discharge Diet Orders (From admission, onward)     Start     Ordered   02/13/22 0000  Diet - low sodium heart healthy        02/13/22 1236           Carb modified diet DISCHARGE MEDICATION: Allergies as of 02/13/2022   No Known Allergies      Medication List     STOP taking these medications    simvastatin 40 MG tablet Commonly known as: ZOCOR       TAKE these medications    alendronate 35 MG tablet Commonly known as: FOSAMAX Take 35 mg by mouth once a week.   aspirin 81 MG tablet Take 81 mg by mouth at bedtime.   brimonidine 0.2 % ophthalmic solution Commonly known as: ALPHAGAN Place 1 drop into both eyes in the morning and at bedtime.   CALCIUM CARBONATE-VITAMIN D PO Take 1 tablet by mouth at bedtime.   citalopram 20 MG tablet Commonly known as: CELEXA Take 20 mg by mouth daily.   clopidogrel 75 MG tablet Commonly known as: PLAVIX Take 1 tablet (75 mg total) by mouth daily.   cyanocobalamin 1000 MCG tablet Commonly known as: VITAMIN B12 Take 1,000 mcg by mouth daily.   cyanocobalamin 1000 MCG/ML injection Commonly known as: VITAMIN B12 Inject 1,000 mcg into the muscle once.   diclofenac Sodium 1 % Gel Commonly  known as: VOLTAREN Apply 2 g topically 4 (four) times daily.   dorzolamide 2 % ophthalmic solution Commonly known as: TRUSOPT Place 1 drop into the right eye 2 (two) times daily.   FISH OIL PO Take 1 capsule by mouth daily.   insulin aspart 100 UNIT/ML injection Commonly known as: novoLOG Inject 30-50 Units into the skin daily. In pump.  [place] and bolus before each meal   insulin glargine 100 UNIT/ML  injection Commonly known as: LANTUS Inject 15 Units into the skin as needed.   latanoprost 0.005 % ophthalmic solution Commonly known as: XALATAN Place 1 drop into the right eye at bedtime.   levothyroxine 75 MCG tablet Commonly known as: SYNTHROID Take 75 mcg by mouth daily before breakfast.   lisinopril 5 MG tablet Commonly known as: ZESTRIL Take 5 mg by mouth at bedtime.   timolol 0.5 % ophthalmic solution Commonly known as: BETIMOL Place 1 drop into both eyes 2 (two) times daily.   timolol 0.5 % ophthalmic solution Commonly known as: TIMOPTIC Place 1 drop into both eyes daily.   traZODone 50 MG tablet Commonly known as: DESYREL Take 50 mg by mouth at bedtime.        Follow-up Information     Tracie Harrier, MD. Schedule an appointment as soon as possible for a visit in 1 week(s).   Specialty: Internal Medicine Why: [facility] Discharge F/UP Contact information: [place] [address] [phone]                Discharge Exam: Danley Danker Weights   02/11/22 0844 02/11/22 1500  Weight: 59 kg 25.25 kg   88 year old cachectic looking male lying in the bed comfortably without any acute distress Lungs clear to auscultation bilaterally Cardiovascular regular rate and rhythm Abdomen soft, benign Neuro alert and awake, nonfocal.  His right pupil is larger than the left which is his baseline and has been followed by Dr. Wallace Going at [facility] eye Skin no rash or lesion Psych normal mood and affect  Condition at discharge: fair  The results of significant diagnostics from this hospitalization (including imaging, microbiology, ancillary and laboratory) are listed below for reference.   Imaging Studies: ECHOCARDIOGRAM COMPLETE  Result Date: 02/12/2022    ECHOCARDIOGRAM REPORT   Patient Name:   Logan Strong Date of Exam: 02/12/2022 Medical Rec #:  [id number]       Height:       66.0 in Accession #:    [id number]       Weight:       137.3 lb Date of Birth:  1931-12-05       BSA:          1.705 m Patient Age:    17 years        BP:           99/61 mmHg Patient Gender: M               HR:           69 bpm. Exam Location:  [facility] Procedure: 2D Echo, Color Doppler and Cardiac Doppler Indications:     I21.4 NSTEMI  History:         Patient has prior history of Echocardiogram examinations, most                  recent 06/20/2019. CAD, Pacemaker, CKD; Risk Factors:Diabetes  and Sleep Apnea.  Sonographer:     Humphrey Rolls Referring Phys:  [id number] [facility] MICHELLE TANG Diagnosing Phys: Arnoldo Hooker MD  Sonographer Comments: Suboptimal subcostal window. IMPRESSIONS  1. Left ventricular ejection fraction, by estimation, is 45 to 50%. The left ventricle has mildly decreased function. The left ventricle demonstrates regional wall motion abnormalities (see scoring diagram/findings for description). Left ventricular diastolic parameters were normal.  2. Right ventricular systolic function is normal. The right ventricular size is normal.  3. Left atrial size was mild to moderately dilated.  4. Right atrial size was mildly dilated.  5. The mitral valve is degenerative. Moderate mitral valve regurgitation.  6. The aortic valve is normal in structure. Aortic valve regurgitation is trivial. FINDINGS  Left Ventricle: Left ventricular ejection fraction, by estimation, is 45 to 50%. The left ventricle has mildly decreased function. The left ventricle demonstrates regional wall motion abnormalities. The left ventricular internal cavity size was normal in size. There is no left ventricular hypertrophy. Left ventricular diastolic parameters were normal. Right Ventricle: The right ventricular size is normal. No increase in right ventricular wall thickness. Right ventricular systolic function is normal. Left Atrium: Left atrial size was mild to moderately dilated. Right Atrium: Right atrial size was mildly dilated. Pericardium: There is no  evidence of pericardial effusion. Mitral Valve: The mitral valve is degenerative in appearance. Moderate mitral valve regurgitation. Tricuspid Valve: The tricuspid valve is normal in structure. Tricuspid valve regurgitation is mild. Aortic Valve: The aortic valve is normal in structure. Aortic valve regurgitation is trivial. Aortic valve mean gradient measures 4.0 mmHg. Aortic valve peak gradient measures 6.4 mmHg. Aortic valve area, by VTI measures 2.16 cm. Pulmonic Valve: The pulmonic valve was normal in structure. Pulmonic valve regurgitation is trivial. Aorta: The aortic root and ascending aorta are structurally normal, with no evidence of dilitation. IAS/Shunts: No atrial level shunt detected by color flow Doppler.  LEFT VENTRICLE PLAX 2D LVIDd:         3.40 cm   Diastology LVIDs:         2.50 cm   LV e' medial:    3.92 cm/s LV PW:         0.90 cm   LV E/e' medial:  35.7 LV IVS:        0.70 cm   LV e' lateral:   6.31 cm/s LVOT diam:     2.00 cm   LV E/e' lateral: 22.2 LV SV:         57 LV SV Index:   33 LVOT Area:     3.14 cm  RIGHT VENTRICLE RV Basal diam:  3.00 cm RV S prime:     11.60 cm/s TAPSE (M-mode): 1.3 cm LEFT ATRIUM             Index        RIGHT ATRIUM           Index LA diam:        4.00 cm 2.35 cm/m   RA Area:     11.90 cm LA Vol (A2C):   43.9 ml 25.75 ml/m  RA Volume:   25.90 ml  15.19 ml/m LA Vol (A4C):   44.3 ml 25.99 ml/m LA Biplane Vol: 45.9 ml 26.93 ml/m  AORTIC VALVE                    PULMONIC VALVE AV Area (Vmax):    1.89 cm     PV Vmax:  0.67 m/s AV Area (Vmean):   1.89 cm     PV Vmean:      48.400 cm/s AV Area (VTI):     2.16 cm     PV VTI:        0.129 m AV Vmax:           126.00 cm/s  PV Peak grad:  1.8 mmHg AV Vmean:          91.500 cm/s  PV Mean grad:  1.0 mmHg AV VTI:            0.262 m AV Peak Grad:      6.4 mmHg AV Mean Grad:      4.0 mmHg LVOT Vmax:         75.70 cm/s LVOT Vmean:        55.000 cm/s LVOT VTI:          0.180 m LVOT/AV VTI ratio: 0.69  AORTA Ao Root  diam: 3.00 cm MITRAL VALVE                TRICUSPID VALVE MV Area (PHT): 2.64 cm     TR Peak grad:   27.2 mmHg MV Decel Time: 287 msec     TR Vmax:        261.00 cm/s MV E velocity: 140.00 cm/s MV A velocity: 183.00 cm/s  SHUNTS MV E/A ratio:  0.77         Systemic VTI:  0.18 m                             Systemic Diam: 2.00 cm Arnoldo Hooker MD Electronically signed by Arnoldo Hooker MD Signature Date/Time: 02/12/2022/11:39:24 AM    Final    DG Chest 2 View  Result Date: 02/11/2022 CLINICAL DATA:  Dizziness and weakness for 2 days. EXAM: CHEST - 2 VIEW COMPARISON:  June 19, 2019 FINDINGS: The heart size and mediastinal contours are stable. Cardiac pacemaker is stable. No focal infiltrate, pulmonary edema, or pleural effusion noted. The visualized skeletal structures are stable. IMPRESSION: No active cardiopulmonary disease. Electronically Signed   By: Sherian Rein M.D.   On: 02/11/2022 09:58   CT HEAD WO CONTRAST ( )  Result Date: 02/11/2022 CLINICAL DATA:  Mental status change with unknown cause EXAM: CT HEAD WITHOUT CONTRAST TECHNIQUE: Contiguous axial images were obtained from the base of the skull through the vertex without intravenous contrast. RADIATION DOSE REDUCTION: This exam was performed according to the departmental dose-optimization program which includes automated exposure control, adjustment of the mA and/or kV according to patient size and/or use of iterative reconstruction technique. COMPARISON:  05/25/2013 FINDINGS: Brain: No evidence of acute infarction, hemorrhage, hydrocephalus, extra-axial collection or mass lesion/mass effect. Generalized atrophy that is progressed from 2015. Chronic small vessel ischemia in the cerebral white matter. Vascular: No hyperdense vessel or unexpected calcification. Skull: Normal. Negative for fracture or focal lesion. Sinuses/Orbits: Bilateral cataract resection. IMPRESSION: 1. No acute finding. 2. Generalized atrophy that is progressed from 2015.  Electronically Signed   By: Tiburcio Pea M.D.   On: 02/11/2022 09:32    Microbiology: Results for orders placed or performed during the hospital encounter of 02/11/22  MRSA Next Gen by PCR, Nasal     Status: None   Collection Time: 02/11/22  3:34 PM   Specimen: Nasal Mucosa; Nasal Swab  Result Value Ref Range Status   MRSA by PCR Next Gen NOT DETECTED NOT DETECTED  Final    Comment: (NOTE) The GeneXpert MRSA Assay (FDA approved for NASAL specimens only), is one component of a comprehensive MRSA colonization surveillance program. It is not intended to diagnose MRSA infection nor to guide or monitor treatment for MRSA infections. Test performance is not FDA approved in patients less than 31 years old. Performed at [facility], [address] [place],  [address]     Labs: CBC: Recent Labs  Lab 02/11/22 0846 02/12/22 0310 02/13/22 0432  WBC 11.9* 14.7* 8.3  HGB 12.0* 10.3* 10.9*  HCT 37.8* 30.1* 33.3*  MCV 94.5 87.5 89.3  PLT 228 201 0000000   Basic Metabolic Panel: Recent Labs  Lab 02/11/22 0846 02/11/22 1736 02/11/22 2137 02/12/22 0105 02/13/22 0432  NA 127* 132* 135 135 137  K 5.4* 3.8 3.5 3.4* 4.4  CL 91* 100 102 105 107  CO2 14* 20* 23 24 24   GLUCOSE 854* 544* 353* 189* 193*  BUN 38* 38* 39* 37* 27*  CREATININE 2.12* 2.07* 1.94* 1.75* 1.44*  CALCIUM 9.5 8.8* 9.0 8.7* 8.8*   Liver Function Tests: Recent Labs  Lab 02/11/22 0846  AST 26  ALT 14  ALKPHOS 119  BILITOT 1.3*  PROT 7.1  ALBUMIN 3.7   CBG: Recent Labs  Lab 02/12/22 2329 02/13/22 0342 02/13/22 0740 02/13/22 1206 02/13/22 1400  GLUCAP 113* 140* 178* 293* 315*    Discharge time spent: greater than 30 minutes.  Signed: Max Sane, MD [facility] 02/14/2022

## 2022-02-14 NOTE — Assessment & Plan Note (Signed)
Most likely secondary to dehydration from poor oral intake as well as osmotic diuresis from hyperglycemia Patient has a baseline serum creatinine of 1.4 and today on admission it is 2.12 Continue IV fluid resuscitation Hold lisinopril Repeat renal parameters in a.m.

## 2022-02-14 NOTE — ED Provider Notes (Signed)
[facility] Provider Note    Event Date/Time   First MD Initiated Contact with Patient 02/14/22 1205     (approximate)   History   Weakness (Patient presents with complaints of weakness that began this morning; Was here recently for ketoacidosis, CBG 571; Was scheduled for heart cath but was started on Plavix instead; Patient is oriented x 4 but nodding off during triage)   HPI  Logan Strong is a 88 y.o. male   Past medical history of type II diabetic on insulin pump, hypertension, hyperlipidemia, hypothyroid, CAD with stent placement, CKD, anemia, pacemaker, depression who was recently discharged last night for DKA and NSTEMI who presents back to the emergency department due to generalized weakness and high blood sugar.  He states that he has been placed back on his insulin pump and denies any pain or discomfort or focal infectious symptoms like respiratory infectious symptoms, abdominal pain nausea vomiting or diarrhea, urinary symptoms.     History was obtained via the patient. His daughter is at bedside as an independent historian who corroborates information as given above. I reviewed an external medical note including discharge summary dated 02/13/2022 denoting DKA that resolved with insulin drip and an NSTEMI with a peak troponin of 15,000 treated with heparin drip but family deferred catheterization requesting hospice at home.      Physical Exam   Triage Vital Signs: ED Triage Vitals  Enc Vitals Group     BP 02/14/22 1135 (!) 90/38     Pulse Rate 02/14/22 1135 96     Resp 02/14/22 1135 15     Temp 02/14/22 1135 97.6 F (36.4 C)     Temp Source 02/14/22 1135 Oral     SpO2 02/14/22 1135 91 %     Weight 02/14/22 1205 137 lb 5.6 oz (62.3 kg)     Height 02/14/22 1205 5\' 6"  (1.676 m)     Head Circumference --      Peak Flow --      Pain Score 02/14/22 1205 0     Pain Loc --      Pain Edu? --      Excl. in GC? --     Most recent vital  signs: Vitals:   02/14/22 1311 02/14/22 1440  BP: (!) 87/40 (!) 102/49  Pulse: 94 (!) 103  Resp: 19 19  Temp:    SpO2: 100% 96%    General: Awake, alert, conversant  CV:  Is membranes are dry and skin turgor is poor he looks clinically dehydrated with a blood pressure of 90/38 Resp:  Normal effort.  Abd:  No distention.  nontender to palpation Other:  Afebrile nontoxic appearance   ED Results / Procedures / Treatments   Labs (all labs ordered are listed, but only abnormal results are displayed) Labs Reviewed  BASIC METABOLIC PANEL - Abnormal; Notable for the following components:      Result Value   Sodium 131 (*)    Potassium 6.3 (*)    Chloride 96 (*)    CO2 10 (*)    Glucose, Bld 694 (*)    BUN 44 (*)    Creatinine, Ser 2.12 (*)    GFR, Estimated 29 (*)    Anion gap 25 (*)    All other components within normal limits  CBC WITH DIFFERENTIAL/PLATELET - Abnormal; Notable for the following components:   WBC 11.8 (*)    RBC 4.00 (*)    Hemoglobin 12.0 (*)  HCT 38.2 (*)    Neutro Abs 10.7 (*)    Lymphs Abs 0.4 (*)    Abs Immature Granulocytes 0.13 (*)    All other components within normal limits  BLOOD GAS, VENOUS - Abnormal; Notable for the following components:   pH, Ven 7.17 (*)    pCO2, Ven 31 (*)    Bicarbonate 11.3 (*)    Acid-base deficit 16.0 (*)    All other components within normal limits  T4, FREE - Abnormal; Notable for the following components:   Free T4 1.20 (*)    All other components within normal limits  CBG MONITORING, ED - Abnormal; Notable for the following components:   Glucose-Capillary 571 (*)    All other components within normal limits  CBG MONITORING, ED - Abnormal; Notable for the following components:   Glucose-Capillary 532 (*)    All other components within normal limits  CBG MONITORING, ED - Abnormal; Notable for the following components:   Glucose-Capillary 510 (*)    All other components within normal limits  TROPONIN I  (HIGH SENSITIVITY) - Abnormal; Notable for the following components:   Troponin I (High Sensitivity) 1,775 (*)    All other components within normal limits  TROPONIN I (HIGH SENSITIVITY) - Abnormal; Notable for the following components:   Troponin I (High Sensitivity) 1,662 (*)    All other components within normal limits  TSH  BASIC METABOLIC PANEL  BASIC METABOLIC PANEL  BETA-HYDROXYBUTYRIC ACID  BETA-HYDROXYBUTYRIC ACID  URINALYSIS, ROUTINE W REFLEX MICROSCOPIC  HEPARIN LEVEL (UNFRACTIONATED)     I reviewed labs and they are notable for blood glucose of 571 and a white blood cell count of 11.8  EKG  ED ECG REPORT I, Lucillie Garfinkel, the attending physician, personally viewed and interpreted this ECG.   Date: 02/14/2022  EKG Time: 1145  Rate: 93  Rhythm: normal sinus rhythm  Axis: nl  Intervals:none  ST&T Change: T wave inversions in inferolateral leads II, 3, aVF, V3 through 6 --these are new from prior EKG.    RADIOLOGY I independently reviewed and interpreted chest x-ray and see no obvious focal opacities   PROCEDURES:  Critical Care performed: Yes, see critical care procedure note(s)  .Critical Care  Performed by: Lucillie Garfinkel, MD Authorized by: Lucillie Garfinkel, MD   Critical care provider statement:    Critical care time (minutes):  30   Critical care was necessary to treat or prevent imminent or life-threatening deterioration of the following conditions:  Metabolic crisis   Critical care was time spent personally by me on the following activities:  Development of treatment plan with patient or surrogate, discussions with consultants, evaluation of patient's response to treatment, examination of patient, ordering and review of laboratory studies, ordering and review of radiographic studies, ordering and performing treatments and interventions, pulse oximetry, re-evaluation of patient's condition and review of [facility] [place] ED: Medications   insulin regular, human (MYXREDLIN) 100 units/ 100 mL infusion (6.5 Units/hr Intravenous Rate/Dose Change 02/14/22 1522)  lactated ringers infusion ( Intravenous New Bag/Given 02/14/22 1508)  dextrose 5 % in lactated ringers infusion (0 mLs Intravenous Hold 02/14/22 1323)  dextrose 50 % solution 0-50 mL (has no administration in time range)  heparin ADULT infusion 100 units/mL (25000 units/239mL) (900 Units/hr Intravenous New Bag/Given 02/14/22 1518)  lactated ringers bolus 1,246 mL (0 mLs Intravenous Stopped 02/14/22 1503)  calcium gluconate inj 10% (1 g) URGENT USE ONLY! (1 g Intravenous Given 02/14/22 1309)  insulin aspart (novoLOG) injection 10 Units (10 Units Intravenous Given 02/14/22 1358)  heparin bolus via infusion 3,700 Units (3,700 Units Intravenous Bolus from Bag 02/14/22 1518)    Consultants:  I spoke with hospitalist for admission & regarding care plan for this patient.   IMPRESSION / MDM / ASSESSMENT AND PLAN / ED COURSE  I reviewed the triage vital signs and the nursing notes.                              Differential diagnosis includes, but is not limited to, DKA, hyperglycemic crisis, HHS, dehydration electrolyte disturbance, infection, ACS   The patient is on the cardiac monitor to evaluate for evidence of arrhythmia and/or significant heart rate changes.  MDM: Patient just discharged for DKA now hyperglycemic and looks dehydrated with a low blood pressure concern for DKA or other metabolic derangements, dehydration.  Given a fluid bolus while ongoing work-up for DKA.  Will need to disconnect his insulin pump to give insulin here and closely monitor.  He was assessed for NSTEMI and deferred catheterization and previous hospitalization and preferred hospice per family but now has T wave inversions in inferolateral leads that are new from prior, he denies chest pain however we will follow troponins; elevated but decreased from prior, heparin drip for NSTEMI EKG changes T wave  inversions new from prior.  Denies chest pain currently.  DKA on lab testing, hyperkalemia, treated with initial insulin bolus followed by heparin infusion and admission.    Patient's presentation is most consistent with acute presentation with potential threat to life or bodily function.       FINAL CLINICAL IMPRESSION(S) / ED DIAGNOSES   Final diagnoses:  Diabetic ketoacidosis without coma associated with type 2 diabetes mellitus ([place])     Rx / DC Orders   ED Discharge Orders     None        Note:  This document was prepared using Dragon voice recognition software and may include unintentional dictation errors.    Lucillie Garfinkel, MD 02/14/22 [phone]

## 2022-02-14 NOTE — Assessment & Plan Note (Signed)
Continue Synthroid °

## 2022-02-14 NOTE — Assessment & Plan Note (Signed)
Patient with a history of insulin-dependent diabetes mellitus who presents to the emergency room 1 day post discharge for evaluation of weakness and confusion when compared to his baseline and was found to have significant hyperglycemia with an anion gap metabolic acidosis. Continue insulin drip initiated in the ER Check serial electrolytes and supplement as needed Check serial beta hydroxybutyric acid levels IV fluid hydration and monitor respiratory status closely Diabetic education

## 2022-02-14 NOTE — H&P (Addendum)
History and Physical    Patient: Logan Strong [id number] DOB: 1931/10/17 DOA: 02/14/2022 DOS: the patient was seen and examined on 02/14/2022 PCP: Tracie Harrier, MD  Patient coming from: Home  Chief Complaint:  Chief Complaint  Patient presents with   Weakness    Patient presents with complaints of weakness that began this morning; Was here recently for ketoacidosis, CBG 571; Was scheduled for heart cath but was started on Plavix instead; Patient is oriented x 4 but nodding off during triage    History is obtained from patient's daughter at the bedside. HPI: Logan Strong is a 88 y.o. male with medical history significant for insulin-dependent diabetes mellitus with complications of stage III chronic kidney disease, coronary artery disease status post recent NSTEMI treated medically, hypothyroidism, history of sick sinus syndrome status post permanent pacemaker placement, chronic diastolic dysfunction CHF with last known LVEF of 45 to 50%, who was discharged from the hospital on 12/5 after treatment for DKA and an NSTEMI which was treated medically.  Patient was discharged home with home hospice. Patient's daughter states that since his discharge he has refused all oral intake and appears to be weaker than normal.  She also notes that he has been confused which is not his baseline.  Daughter also notes that his blood sugars have been high at home. Patient was seen by the diabetic educator in the ER who noted that his insulin pump was kinked and nonfunctional. Patient denies having any chest or abdominal pain, no nausea, no vomiting, no cough, no fever, no chills, no headache, no dizziness, no lightheadedness, no leg swelling, no blurred vision,no focal deficit.. Labs reviewed.  White count 11.8, sodium 131, potassium 6.3, serum bicarb of 10, glucose 694, anion gap of 25, troponin 1775 >>1662 VBG 7.17/31/11.3/49 Twelve-lead EKG shows normal sinus rhythm with ST and T wave  abnormality Inferior lateral leads Patient has been started on an insulin drip, he received 1.2 L fluid bolus and was started on a heparin drip.  He also received IV calcium gluconate He will be admitted to the hospital   Review of Systems: As mentioned in the history of present illness. All other systems reviewed and are negative. Past Medical History:  Diagnosis Date   Actinic keratosis    Anemia    Chronic kidney disease    "functioning at 50%"   Coronary artery disease    Diabetes mellitus, type 2 ([place])    Dysrhythmia    Glaucoma    Hypothyroidism    Osteoporosis    Pneumonia    Sleep apnea    could not tolerate CPAP   Wears dentures    partial upper and lower   Past Surgical History:  Procedure Laterality Date   APPENDECTOMY     BACK SURGERY     lumbar   CARDIAC CATHETERIZATION  2006   stent x1   CATARACT EXTRACTION W/PHACO Right 12/19/2016   Procedure: CATARACT EXTRACTION PHACO AND INTRAOCULAR LENS PLACEMENT ([place]) RIGHT DIABETIC;  Surgeon: Leandrew Koyanagi, MD;  Location: [place];  Service: Ophthalmology;  Laterality: Right;   HERNIA REPAIR     PACEMAKER INSERTION N/A 06/13/2017   Procedure: INSERTION PACEMAKER INITIAL DUAL CHAMBER INSERTION;  Surgeon: Isaias Cowman, MD;  Location: [facility];  Service: Cardiovascular;  Laterality: N/A;   TRABECULECTOMY Right 12/19/2016   Procedure: TRABECULECTOMY WITH [facility] AND EXPRESS SHUNT;  Surgeon: Leandrew Koyanagi, MD;  Location: [place];  Service: Ophthalmology;  Laterality: Right;  Diabetic - insulin pump  sleep apnea   Social History:  reports that he quit smoking about 52 years ago. He has never used smokeless tobacco. He reports that he does not drink alcohol and does not use drugs.  No Known Allergies  Family History  Problem Relation Age of Onset   Heart disease Mother    CAD Brother     Prior to Admission medications   Medication Sig Start Date End Date Taking? Authorizing  Provider  alendronate (FOSAMAX) 35 MG tablet Take 35 mg by mouth once a week.   Yes [provider]  aspirin 81 MG tablet Take 81 mg by mouth at bedtime.    Yes [provider]  brimonidine (ALPHAGAN) 0.2 % ophthalmic solution Place 1 drop into both eyes in the morning and at bedtime. 04/29/19  Yes [provider]  CALCIUM CARBONATE-VITAMIN D PO Take 1 tablet by mouth at bedtime.    Yes [provider]  citalopram (CELEXA) 20 MG tablet Take 20 mg by mouth daily. 09/21/21 09/21/22 Yes [provider]  clopidogrel (PLAVIX) 75 MG tablet Take 1 tablet (75 mg total) by mouth daily. 02/14/22  Yes Max Sane, MD  cyanocobalamin (VITAMIN B12) 1000 MCG/ML injection Inject 1,000 mcg into the muscle once.   Yes [provider]  diclofenac Sodium (VOLTAREN) 1 % GEL Apply 2 g topically 4 (four) times daily. 08/28/21  Yes [provider]  dorzolamide (TRUSOPT) 2 % ophthalmic solution Place 1 drop into the right eye 2 (two) times daily.    Yes [provider]  insulin aspart (NOVOLOG) 100 UNIT/ML injection Inject 30-50 Units into the skin daily. In pump.  [place] and bolus before each meal   Yes [provider]  insulin glargine (LANTUS) 100 UNIT/ML injection Inject 15 Units into the skin as needed.    Yes [provider]  latanoprost (XALATAN) 0.005 % ophthalmic solution Place 1 drop into the right eye at bedtime.    Yes [provider]  levothyroxine (SYNTHROID) 75 MCG tablet Take 75 mcg by mouth daily before breakfast.   Yes [provider]  lisinopril (PRINIVIL,ZESTRIL) 5 MG tablet Take 5 mg by mouth at bedtime.    Yes [provider]  Omega-3 Fatty Acids (FISH OIL PO) Take 1 capsule by mouth daily.    Yes [provider]  timolol (TIMOPTIC) 0.5 % ophthalmic solution Place 1 drop into both eyes daily. 12/19/21  Yes [provider]  traZODone (DESYREL) 50 MG tablet Take 50 mg by mouth  at bedtime.   Yes [provider]  timolol (BETIMOL) 0.5 % ophthalmic solution Place 1 drop into both eyes 2 (two) times daily. Patient not taking: Reported on 02/14/2022    [provider]    Physical Exam: Vitals:   02/14/22 1135 02/14/22 1205 02/14/22 1311 02/14/22 1440  BP: (!) 90/38  (!) 87/40 (!) 102/49  Pulse: 96  94 (!) 103  Resp: 15  19 19   Temp: 97.6 F (36.4 C)     TempSrc: Oral     SpO2: 91% 100% 100% 96%  Weight:  62.3 kg    Height:  5\' 6"  (1.676 m)     Physical Exam Vitals and nursing note reviewed.  Constitutional:      Appearance: Normal appearance.     Comments: Frail  HENT:     Head: Normocephalic and atraumatic.     Nose: Nose normal.     Mouth/Throat:     Mouth: Mucous membranes are moist.  Eyes:     Conjunctiva/sclera: Conjunctivae normal.  Cardiovascular:     Rate and Rhythm: Tachycardia present.  Pulmonary:     Effort: Pulmonary effort is normal.     Breath sounds: Normal breath sounds.  Abdominal:     General: Abdomen is flat. Bowel sounds are normal.     Palpations: Abdomen is soft.  Musculoskeletal:        General: Normal range of motion.     Cervical back: Normal range of motion and neck supple.  Skin:    General: Skin is warm and dry.  Neurological:     Mental Status: He is alert.     Motor: Weakness present.     Comments: Oriented to person and place  Psychiatric:        Mood and Affect: Mood normal.        Behavior: Behavior normal.     Data Reviewed: Relevant notes from primary care and specialist visits, past discharge summaries as available in EHR, including Care Everywhere. Prior diagnostic testing as pertinent to current admission diagnoses Updated medications and problem lists for reconciliation ED course, including vitals, labs, imaging, treatment and response to treatment Triage notes, nursing and pharmacy notes and ED provider's notes Notable results as noted in HPI Beta hydroxybutyric acid greater  than 8, troponin 1775 >> 1662, free T41.20, sodium 131, potassium 6.3, chloride 96, bicarb 10, glucose 694, BUN 44, creatinine 2.12 compared to 1.41, 1 day prior to admission, calcium 8.9, white count 11.8, hemoglobin 12.0, hematocrit 38.2, platelet count 226 There are no new results to review at this time.  Assessment and Plan: * Diabetic ketoacidosis without coma associated with type 1 diabetes mellitus (HCC) Patient with a history of insulin-dependent diabetes mellitus who presents to the emergency room 1 day post discharge for evaluation of weakness and confusion when compared to his baseline and was found to have significant hyperglycemia with an anion gap metabolic acidosis. Continue insulin drip initiated in the ER Check serial electrolytes and supplement as needed Check serial beta hydroxybutyric acid levels IV fluid hydration and monitor respiratory status closely Diabetic education  Acute renal failure superimposed on stage 3a chronic kidney disease (HCC) Most likely secondary to dehydration from poor oral intake as well as osmotic diuresis from hyperglycemia Patient has a baseline serum creatinine of 1.4 and today on admission it is 2.12 Continue IV fluid resuscitation Hold lisinopril Repeat renal parameters in a.m.  Essential hypertension Hold lisinopril due to relative hypotension  Coronary artery disease Status post recent non-ST elevation MI that was treated medically. Chart review shows patient's troponin peaked at 15,000 and EKG showed diffuse ST depression. 2D echocardiogram showed a reduction in his LVEF from 55 to 60% to 45 to 50% Patient daughters opted for medical management due to his overall progressive decline Continue aspirin, Plavix and atorvastatin Not on beta-blockers due to relative hypotension  Hypothyroidism Continue Synthroid  Ischemic dilated cardiomyopathy due to coronary artery disease Last known LVEF of 45 to 50% Patient not on beta-blockers due  to relative hypotension Lisinopril is currently on hold due to relative hypotension and AKI Continue aspirin, Plavix and statins  Hyponatremia Secondary to hyperglycemia Expect improvement in serum sodium following resolution of hyperglycemia  Depression Continue Celexa and trazodone  Elevated troponin Shows a downward trend and is related to patient's recent non-ST elevation MI Continue medical management      Advance Care Planning:   Code Status: DNR   Consults: Diabetic education, Palliative care  Family  Communication: Greater than 50% of time was spent discussing patient's condition and plan of care with his daughter at the bedside.  All questions and concerns have been addressed.  She verbalizes understanding and agrees with the plan.  CODE STATUS was discussed and patient is a DO NOT RESUSCITATE.  Severity of Illness: The appropriate patient status for this patient is INPATIENT. Inpatient status is judged to be reasonable and necessary in order to provide the required intensity of service to ensure the patient's safety. The patient's presenting symptoms, physical exam findings, and initial radiographic and laboratory data in the context of their chronic comorbidities is felt to place them at high risk for further clinical deterioration. Furthermore, it is not anticipated that the patient will be medically stable for discharge from the hospital within 2 midnights of admission.   * I certify that at the point of admission it is my clinical judgment that the patient will require inpatient hospital care spanning beyond 2 midnights from the point of admission due to high intensity of service, high risk for further deterioration and high frequency of surveillance required.*  Author: Collier Bullock, MD 02/14/2022 3:45 PM  For on call review [email or web address].

## 2022-02-14 NOTE — ED Notes (Signed)
Per Dr. Joylene Igo heparin was discontinued because troponin levels have been down trending.

## 2022-02-14 NOTE — Consult Note (Signed)
ANTICOAGULATION CONSULT NOTE  Pharmacy Consult for heparin infusion - (consult re-activated 12/6) Indication: chest pain/ACS   No Known Allergies  Patient Measurements: Height: 5\' 6"  (167.6 cm) Weight: 62.3 kg (137 lb 5.6 oz) IBW/kg (Calculated) : 63.8 Heparin Dosing Weight: 59 kg  Vital Signs: Temp: 97.6 F (36.4 C) (12/06 1135) Temp Source: Oral (12/06 1135) BP: 102/49 (12/06 1440) Pulse Rate: 103 (12/06 1440)  Labs: Recent Labs    02/11/22 1736 02/11/22 1956 02/11/22 2137 02/11/22 2247 02/12/22 0105 02/12/22 0310 02/12/22 0310 02/12/22 1144 02/13/22 0432 02/14/22 1219 02/14/22 1225  HGB  --   --   --   --   --  10.3*   < >  --  10.9* 12.0*  --   HCT  --   --   --   --   --  30.1*  --   --  33.3* 38.2*  --   PLT  --   --   --   --   --  201  --   --  179 226  --   APTT >200*  --   --  105*  --   --   --   --   --   --   --   HEPARINUNFRC  --   --   --   --   --  0.44  --  0.37 0.19*  --   --   CREATININE 2.07*  --  1.94*  --  1.75*  --   --   --  1.44*  --  2.12*  TROPONINIHS 12,906* 15,479* 14/06/23*  --   --   --   --   --   --  1,775*  --    < > = values in this interval not displayed.     Estimated Creatinine Clearance: 21.2 mL/min (A) (by C-G formula based on SCr of 2.12 mg/dL (H)).   Medical History: Past Medical History:  Diagnosis Date   Actinic keratosis    Anemia    Chronic kidney disease    "functioning at 50%"   Coronary artery disease    Diabetes mellitus, type 2 (HCC)    Dysrhythmia    Glaucoma    Hypothyroidism    Osteoporosis    Pneumonia    Sleep apnea    could not tolerate CPAP   Wears dentures    partial upper and lower    Medications:  PTA: aspirin 81 mg PO HS Inpatient:  heparin infusion (12/3 >>) Allergies: NKDA  Assessment: 88 y.o. male w/ h/o type II diabetic on insulin pump, HTN, HLD, hypothyroid, CAD with stent placement, CKD, anemia, PPM, & MDD who was recently discharged last night for DKA and NSTEMI who presents  back to the emergency department due to generalized weakness and high blood sugar who presents to the ER for evaluation of weakness and dizziness. A review of medical records reveals no chronic anticoagulation prior to arrival.  Date Time aPTT/HL Rate/Comment 12/5 0432 0.19  SUBtherapeutic; 700 > 900 un/hr --- heparin gtt stopped at discharge 12/5 >> consult reactivated on 12/6 via ED --- 12/6  Goal of Therapy:  Heparin level 0.3-0.7 units/ml Monitor platelets by anticoagulation protocol: Yes  Plan:  Last adjustment made to infusion was 700>900 un/hr. Will resume at prior rate and check 8hr HL. Bolus 3700 un x1; then resume heparin infusion at 900 units/hr  Check HL 8 hrs after resumption. CTM CBC daily while on heparin gtt  Martyn Malay 02/14/2022 2:43 PM

## 2022-02-14 NOTE — ED Notes (Signed)
Critical value labs relayed to [place], Charity fundraiser.

## 2022-02-14 NOTE — Inpatient Diabetes Management (Signed)
Inpatient Diabetes Program Recommendations  AACE/ADA: New Consensus Statement on Inpatient Glycemic Control  Target Ranges:  Prepandial:   less than 140 mg/dL      Peak postprandial:   less than 180 mg/dL (1-2 hours)      Critically ill patients:  140 - 180 mg/dL    Latest Reference Range & Units 02/14/22 12:25  CO2 22 - 32 mmol/L 10 (L)  Glucose 70 - 99 mg/dL 694 (HH)  Anion gap 5 - 15  25 (H)   Review of Glycemic Control  Diabetes history: DM1 (makes NO insulin; requires basal, correction, and carb coverage insulin) Outpatient Diabetes medications: [name] with Novolog; Lantus 15 units daily when not using pump Current orders for Inpatient glycemic control: None; in ED   Inpatient Diabetes Program Recommendations:    Insulin:Labs today at 12:25 indicate acidosis. Therefore, anticipate patient will need to be started on IV insulin drip. IV insulin will be needed until acidosis has resolved. Once acidosis has resolved and provider is ready to transition to SQ insulin, please consider ordering [name] 10 units Q24H, CBGs Q4H,  Novolog 0-9 units Q4H, and Novolog 3 units TID with meals for meal coverage if diet is ordered.   Outpatient: Due to concern with patient being able to use manage DM with an insulin pump, would recommend patient be discharged on SQ insulin regimen. Anticipate patient will need assistance at home with DM management.   NOTE: Patient has DM1 and uses an insulin pump for DM control. Patient was inpatient 02/11/22-02/13/22 with DKA.During that admission, DKA was treated with IV insulin, patient transitioned to SQ insulin, and then transitioned to insulin pump on 02/13/22 around 9:30 am. This diabetes coordinator met with patient and one of his daughters Jackelyn Poling) on 02/13/22 at 12:15. Patient reported that he has put his pump back on at 9:30 and only gave himself 1 unit of insulin for correction and carb coverage. Discussed that he likely needed more than 1 unit of  insulin for glucose correction and carbs he consumed at breakfast. At that time glucose was 293 mg/dl and patient was able to give himself a bolus of insulin (2.86 units) for correction. Patient was discharged on 02/13/22 around 15:20. Patient is now back in ED with weakness with CBG of 571 mg/dl. Went to ED and spoke with patient and daughter Mordecai Rasmussen) in hallway bed. Patient has insulin pump on attached to site along with Dexcom CGM sensor as well. Inquired about glucose since leaving the hospital yesterday. Patient initially said he had not given himself any boluses with his insulin pump since leaving but then later stated he had given himself some insulin. When asked patient to review his history in pump, he could not remember the PIN number to get into the pump. Patient appears confused today about using his insulin pump and DM management.  Discussed concerns with patient and daughter about patient being appropriate to continue to use an insulin pump. Patient's daughter at bedside said that patient does everything for his DM himself and he lives by himself so he would need to manage his DM himself as there is no family to be with him to help with pump or insulin injections. Explained that it is likely not going to be appropriate for patient to continue his insulin pump and he will need to do SQ insulin injections. Patient's daughter stated that patient would not be able to do insulin injections himself and stated that Dr. Honor Junes needs to come evaluate patient.  Informed patient's daughter that Endocrinology did not come into the hospital to see patients but hospital providers could reach out to Dr. Honor Junes if they felt it was necessary. Disconnected patient's insulin pump and gave it to his daughter. Examined infusion site and the site adhesive was not sticking on the lower part of the site and when removed the site was kinked. Discussed with patient and his daughter that he is most likely back in DKA because  his site was not in and kinked. Asked patient's daughter if her or other family could start assisting patient with DM management and she stated that they could not unless her or her sister quit their jobs. Asked if she felt like patient may need to be placed and she stated patient is not willing to be placed anywhere. Asked if they could hire someone to help him with DM management and she got upset and stated "I can not have this conversation with you right now" and then she got up and left the ED.  Janett Billow, RN was at beside during conversation. Talked with Dr. Jacelyn Grip to let him know situation and that patient's site was not on well and kinked. Discussed concerns about patient using an insulin pump as he will likely need to use SQ insulin regimen since he is unable to remember what to do with his pump. Patient likely to be started on IV insulin for DKA.    Thanks, Barnie Alderman, RN, MSN, [place] Diabetes Coordinator Inpatient Diabetes Program [phone] (Team Pager from 8am to [place])

## 2022-02-14 NOTE — Assessment & Plan Note (Signed)
Hold lisinopril due to relative hypotension

## 2022-02-14 NOTE — ED Notes (Signed)
Dr Modesto Charon informed of critical lab values, no orders at this time.

## 2022-02-15 DIAGNOSIS — N1831 Chronic kidney disease, stage 3a: Secondary | ICD-10-CM

## 2022-02-15 DIAGNOSIS — E1010 Type 1 diabetes mellitus with ketoacidosis without coma: Secondary | ICD-10-CM | POA: Diagnosis not present

## 2022-02-15 DIAGNOSIS — Z66 Do not resuscitate: Secondary | ICD-10-CM

## 2022-02-15 DIAGNOSIS — N179 Acute kidney failure, unspecified: Secondary | ICD-10-CM

## 2022-02-15 DIAGNOSIS — I495 Sick sinus syndrome: Secondary | ICD-10-CM | POA: Insufficient documentation

## 2022-02-15 DIAGNOSIS — Z950 Presence of cardiac pacemaker: Secondary | ICD-10-CM | POA: Insufficient documentation

## 2022-02-15 DIAGNOSIS — I255 Ischemic cardiomyopathy: Secondary | ICD-10-CM

## 2022-02-15 DIAGNOSIS — Z515 Encounter for palliative care: Secondary | ICD-10-CM

## 2022-02-15 DIAGNOSIS — I2510 Atherosclerotic heart disease of native coronary artery without angina pectoris: Secondary | ICD-10-CM

## 2022-02-15 LAB — CBC
HCT: 30.2 % — ABNORMAL LOW (ref 39.0–52.0)
Hemoglobin: 10.3 g/dL — ABNORMAL LOW (ref 13.0–17.0)
MCH: 30.3 pg (ref 26.0–34.0)
MCHC: 34.1 g/dL (ref 30.0–36.0)
MCV: 88.8 fL (ref 80.0–100.0)
Platelets: 173 10*3/uL (ref 150–400)
RBC: 3.4 MIL/uL — ABNORMAL LOW (ref 4.22–5.81)
RDW: 13.4 % (ref 11.5–15.5)
WBC: 12.6 10*3/uL — ABNORMAL HIGH (ref 4.0–10.5)
nRBC: 0 % (ref 0.0–0.2)

## 2022-02-15 LAB — BASIC METABOLIC PANEL
Anion gap: 8 (ref 5–15)
Anion gap: 7 (ref 5–15)
Anion gap: 9 (ref 5–15)
BUN: 42 mg/dL — ABNORMAL HIGH (ref 8–23)
BUN: 37 mg/dL — ABNORMAL HIGH (ref 8–23)
BUN: 37 mg/dL — ABNORMAL HIGH (ref 8–23)
CO2: 23 mmol/L (ref 22–32)
CO2: 20 mmol/L — ABNORMAL LOW (ref 22–32)
CO2: 21 mmol/L — ABNORMAL LOW (ref 22–32)
Calcium: 8.5 mg/dL — ABNORMAL LOW (ref 8.9–10.3)
Calcium: 8.1 mg/dL — ABNORMAL LOW (ref 8.9–10.3)
Calcium: 8.6 mg/dL — ABNORMAL LOW (ref 8.9–10.3)
Chloride: 107 mmol/L (ref 98–111)
Chloride: 109 mmol/L (ref 98–111)
Chloride: 105 mmol/L (ref 98–111)
Creatinine, Ser: 1.73 mg/dL — ABNORMAL HIGH (ref 0.61–1.24)
Creatinine, Ser: 1.59 mg/dL — ABNORMAL HIGH (ref 0.61–1.24)
Creatinine, Ser: 1.59 mg/dL — ABNORMAL HIGH (ref 0.61–1.24)
GFR, Estimated: 38 mL/min — ABNORMAL LOW (ref >60)
GFR, Estimated: 41 mL/min — ABNORMAL LOW (ref >60)
GFR, Estimated: 41 mL/min — ABNORMAL LOW (ref >60)
Glucose, Bld: 164 mg/dL — ABNORMAL HIGH (ref 70–99)
Glucose, Bld: 129 mg/dL — ABNORMAL HIGH (ref 70–99)
Glucose, Bld: 129 mg/dL — ABNORMAL HIGH (ref 70–99)
Potassium: 3.8 mmol/L (ref 3.5–5.1)
Potassium: 3.8 mmol/L (ref 3.5–5.1)
Potassium: 3.9 mmol/L (ref 3.5–5.1)
Sodium: 138 mmol/L (ref 135–145)
Sodium: 136 mmol/L (ref 135–145)
Sodium: 135 mmol/L (ref 135–145)

## 2022-02-15 LAB — BETA-HYDROXYBUTYRIC ACID: Beta-Hydroxybutyric Acid: 0.15 mmol/L (ref 0.05–0.27)

## 2022-02-15 LAB — GLUCOSE, CAPILLARY
Glucose-Capillary: 108 mg/dL — ABNORMAL HIGH (ref 70–99)
Glucose-Capillary: 116 mg/dL — ABNORMAL HIGH (ref 70–99)
Glucose-Capillary: 127 mg/dL — ABNORMAL HIGH (ref 70–99)
Glucose-Capillary: 129 mg/dL — ABNORMAL HIGH (ref 70–99)
Glucose-Capillary: 129 mg/dL — ABNORMAL HIGH (ref 70–99)
Glucose-Capillary: 137 mg/dL — ABNORMAL HIGH (ref 70–99)
Glucose-Capillary: 166 mg/dL — ABNORMAL HIGH (ref 70–99)
Glucose-Capillary: 186 mg/dL — ABNORMAL HIGH (ref 70–99)
Glucose-Capillary: 189 mg/dL — ABNORMAL HIGH (ref 70–99)
Glucose-Capillary: 223 mg/dL — ABNORMAL HIGH (ref 70–99)
Glucose-Capillary: 225 mg/dL — ABNORMAL HIGH (ref 70–99)
Glucose-Capillary: 234 mg/dL — ABNORMAL HIGH (ref 70–99)
Glucose-Capillary: 265 mg/dL — ABNORMAL HIGH (ref 70–99)
Glucose-Capillary: 97 mg/dL (ref 70–99)

## 2022-02-15 LAB — MRSA NEXT GEN BY PCR, NASAL: MRSA by PCR Next Gen: NOT DETECTED

## 2022-02-15 LAB — MAGNESIUM: Magnesium: 1.6 mg/dL — ABNORMAL LOW (ref 1.7–2.4)

## 2022-02-15 LAB — PHOSPHORUS: Phosphorus: 3 mg/dL (ref 2.5–4.6)

## 2022-02-15 MED ORDER — LACTATED RINGERS IV BOLUS
1000.0000 mL | Freq: Once | INTRAVENOUS | Status: AC
  Administered 2022-02-15: 1000 mL via INTRAVENOUS

## 2022-02-15 MED ORDER — "PEN NEEDLES 5/16"" 31G X 8 MM MISC": 1.0000 | Freq: Four times a day (QID) | 0 refills | Status: DC

## 2022-02-15 MED ORDER — MAGNESIUM SULFATE 4 GM/100ML IV SOLN
4.0000 g | Freq: Once | INTRAVENOUS | Status: AC
  Administered 2022-02-15: 4 g via INTRAVENOUS
  Filled 2022-02-15: qty 100

## 2022-02-15 MED ORDER — ORAL CARE MOUTH RINSE: 15.0000 mL | OROMUCOSAL | Status: DC | PRN

## 2022-02-15 MED ORDER — INSULIN ASPART 100 UNIT/ML FLEXPEN: 3.0000 [IU] | PEN_INJECTOR | Freq: Three times a day (TID) | SUBCUTANEOUS | 11 refills | Status: DC

## 2022-02-15 MED ORDER — INSULIN GLARGINE-YFGN 100 UNIT/ML ~~LOC~~ SOLN
10.0000 [IU] | Freq: Every day | SUBCUTANEOUS | Status: DC
  Administered 2022-02-15: 10 [IU] via SUBCUTANEOUS
  Filled 2022-02-15 (×2): qty 0.1

## 2022-02-15 MED ORDER — CHLORHEXIDINE GLUCONATE CLOTH 2 % EX PADS
6.0000 | MEDICATED_PAD | Freq: Every day | CUTANEOUS | Status: DC
  Administered 2022-02-14 – 2022-02-15 (×2): 6 via TOPICAL

## 2022-02-15 MED ORDER — INSULIN ASPART 100 UNIT/ML IJ SOLN: 0.0000 [IU] | Freq: Three times a day (TID) | INTRAMUSCULAR | Status: DC

## 2022-02-15 MED ORDER — INSULIN ASPART 100 UNIT/ML IJ SOLN
0.0000 [IU] | Freq: Every day | INTRAMUSCULAR | Status: DC
  Administered 2022-02-15: 2 [IU] via SUBCUTANEOUS
  Filled 2022-02-15: qty 1

## 2022-02-15 MED ORDER — INSULIN GLARGINE 100 UNIT/ML SOLOSTAR PEN: 13.0000 [IU] | PEN_INJECTOR | Freq: Every day | SUBCUTANEOUS | 11 refills | Status: DC

## 2022-02-15 MED ORDER — INSULIN ASPART 100 UNIT/ML IJ SOLN
0.0000 [IU] | Freq: Three times a day (TID) | INTRAMUSCULAR | Status: DC
  Administered 2022-02-15 – 2022-02-16 (×2): 5 [IU] via SUBCUTANEOUS
  Filled 2022-02-15 (×2): qty 1

## 2022-02-15 MED ORDER — INSULIN ASPART 100 UNIT/ML IJ SOLN
3.0000 [IU] | Freq: Three times a day (TID) | INTRAMUSCULAR | Status: DC
  Administered 2022-02-15 – 2022-02-16 (×3): 3 [IU] via SUBCUTANEOUS
  Filled 2022-02-15 (×3): qty 1

## 2022-02-15 MED ORDER — INSULIN GLARGINE-YFGN 100 UNIT/ML ~~LOC~~ SOLN
15.0000 [IU] | Freq: Every day | SUBCUTANEOUS | Status: DC
  Filled 2022-02-15: qty 0.15

## 2022-02-15 MED ORDER — LACTATED RINGERS IV SOLN: INTRAVENOUS | Status: DC

## 2022-02-15 NOTE — Consult Note (Signed)
Consultation Note Date: 02/15/2022   Patient Name: Logan Strong  DOB: 17-Jan-1932  MRN: [id number]  Age / Sex: 88 y.o., male  PCP: Barbette Reichmann, MD Referring Physician: Kathrynn Running, MD  Reason for Consultation: Establishing goals of care, Hospice Evaluation, and Psychosocial/spiritual support  HPI/Patient Profile: 88 y.o. male   admitted on 02/14/2022 with  medical history significant for insulin-dependent diabetes mellitus with complications of stage III chronic kidney disease, coronary artery disease status post recent NSTEMI treated medically, hypothyroidism, history of sick sinus syndrome status post permanent pacemaker placement, chronic diastolic dysfunction CHF with last known LVEF of 45 to 50%, who was discharged from the hospital on 12/5 after treatment for DKA and an NSTEMI which was treated medically.    Patient was discharged home with home hospice referral, unfortunately unable to be seen before readmission   Patient's daughter states that since his discharge he has refused all oral intake and appears to be weaker than normal.  She also notes that he has been confused which is not his baseline.  Daughter also notes that his blood sugars have been high at home.   Patient was seen by the diabetic educator in the ER who noted that his insulin pump was kinked and nonfunctional.  He was  admitted to the hospital for treatment and stabilization    Patient and family face treatment options , decisions advanced directive decisions and anticipatory care needs.  Clinical Assessment and Goals of Care:  This NP Lorinda Creed reviewed medical records, received report from team, assessed the patient and then meet at the patient's bedside long with his 2 daughters Logan Strong is an Nature conservation officer Strong/ HPOA/ to discuss diagnosis, prognosis, GOC, EOL wishes disposition and options.   Concept of  Palliative Care was introduced as specialized medical care for people and their families living with serious illness.  If focuses on providing relief from the symptoms and stress of a serious illness.  The goal is to improve quality of life for both the patient and the family. Values and goals of care important to patient and family were attempted to be elicited.  Created space and opportunity for patient and family to explore thoughts and feelings regarding current medical situation.  Daughters at bedside verbalize a clear understanding of the patient's multiple comorbidities and likely limited prognosis.  Daughters verbalized a clear understanding and support patient's previously documented wishes, hoping for comfort and dignity as end-of-life approaches.  Patient is alert and oriented x 2 but confused with events over the last few days.   A  discussion was had today regarding advanced directives.  Concepts specific to code status, artifical feeding and hydration, continued IV antibiotics and rehospitalization was had.    The difference between a aggressive medical intervention path  and a palliative comfort care path for this patient at this time was had.    Education offered on hospice benefit; philosophy and eligibility.  Patient and family are very open to hospice services in the home on discharge.  MOST form introduced and completed   Plan of care: -DNR/DNI -No artificial feeding or hydration now or in the future -No dialysis, even if indicated -D/c insulin pump- Zerita Boers diabetic coordinator in collaboration with                 Dr Delories Heinz appropriate changes and offering education to family -Disposition home with hospice services, likely in the morning-once hospice has been set up -Avoid rehospitalization  Both daughters at bedside verbalize their ability to provide 24/7 supervision in the home.   Questions and concerns addressed.  Patient  encouraged to call with  questions or concerns.     PMT will continue to support holistically.    HCPOA/Logan Strong-- documents are in hard chart     SUMMARY OF RECOMMENDATIONS    Code Status/Advance Care Planning: DNR   Palliative Prophylaxis:  Aspiration, Bowel Regimen, Delirium Protocol, Frequent Pain Assessment, and Oral Care  Additional Recommendations (Limitations, Scope, Preferences): Avoid Hospitalization, No Artificial Feeding, and No Hemodialysis  Psycho-social/Spiritual:  Desire for further Chaplaincy support:no Additional Recommendations: Education on Hospice  Prognosis:  Unable to determine  Discharge Planning: Home with Hospice      Primary Diagnoses: Present on Admission:  Diabetic ketoacidosis without coma associated with type 1 diabetes mellitus (HCC)  Essential hypertension  Elevated troponin  Hypothyroidism  Coronary artery disease  Acute renal failure superimposed on stage 3a chronic kidney disease (HCC)  Depression  Hyponatremia  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 The following aspects are pertinent.  Past Medical History:  Diagnosis Date   Actinic keratosis    Anemia    Chronic kidney disease    "functioning at 50%"   Coronary artery disease    Diabetes mellitus, type 2 (HCC)    Dysrhythmia    Glaucoma    Hypothyroidism    Osteoporosis    Pneumonia    Sleep apnea    could not tolerate CPAP   Wears dentures    partial upper and lower   Social History   Socioeconomic History   Marital status: Married    Spouse name: Not on file   Number of children: Not on file   Years of education: Not on file   Highest education level: Not on file  Occupational History   Occupation: retired  Tobacco Use   Smoking status: Former    Years: 30.00    Types: Cigarettes    Quit date: 1971    Years since quitting: 52.9   Smokeless  tobacco: Never  Vaping Use   Vaping Use: Never used  Substance and Sexual Activity   Alcohol use: Never   Drug use: Never   Sexual activity: Not on file  Other Topics Concern   Not on file  Social History Narrative   Not on file   Social Determinants of Health   Financial Resource Strain: Not on file  Food Insecurity: No Food Insecurity (02/15/2022)   Hunger Vital Sign    Worried About Running Out of Food in the Last Year: Never true    Ran Out of Food in the Last Year: Never true  Transportation Needs: No Transportation Needs (02/15/2022)   PRAPARE - Administrator, Civil Service (Medical): No    Lack of Transportation (Non-Medical): No  Physical Activity: Not on file  Stress: Not on file  Social Connections: Not on file  Family History  Problem Relation Age of Onset   Heart disease Mother    CAD Brother    Scheduled Meds:  aspirin EC  81 mg Oral QHS   brimonidine  1 drop Both Eyes BID   calcium-vitamin D  1 tablet Oral QHS   Chlorhexidine Gluconate Cloth  6 each Topical Daily   citalopram  20 mg Oral Daily   clopidogrel  75 mg Oral Daily   dorzolamide  1 drop Right Eye BID   enoxaparin (LOVENOX) injection  30 mg Subcutaneous Q24H   insulin aspart  0-5 Units Subcutaneous QHS   insulin aspart  0-9 Units Subcutaneous TID WC   insulin aspart  3 Units Subcutaneous TID WC   insulin glargine-yfgn  10 Units Subcutaneous Daily   latanoprost  1 drop Right Eye QHS   levothyroxine  75 mcg Oral Q0600   omega-3 acid ethyl esters  2 g Oral Daily   timolol  1 drop Both Eyes Daily   traZODone  50 mg Oral QHS   Continuous Infusions: PRN Meds:.dextrose, dextrose, mouth rinse Medications Prior to Admission:  Prior to Admission medications   Medication Sig Start Date End Date Taking? Authorizing Provider  alendronate (FOSAMAX) 35 MG tablet Take 35 mg by mouth once a week.   Yes [provider]  aspirin 81 MG tablet Take 81 mg by mouth at bedtime.    Yes  [provider]  brimonidine (ALPHAGAN) 0.2 % ophthalmic solution Place 1 drop into both eyes in the morning and at bedtime. 04/29/19  Yes [provider]  CALCIUM CARBONATE-VITAMIN D PO Take 1 tablet by mouth at bedtime.    Yes [provider]  citalopram (CELEXA) 20 MG tablet Take 20 mg by mouth daily. 09/21/21 09/21/22 Yes [provider]  clopidogrel (PLAVIX) 75 MG tablet Take 1 tablet (75 mg total) by mouth daily. 02/14/22  Yes Delfino LovettShah, Vipul, MD  cyanocobalamin (VITAMIN B12) 1000 MCG/ML injection Inject 1,000 mcg into the muscle once.   Yes [provider]  diclofenac Sodium (VOLTAREN) 1 % GEL Apply 2 g topically 4 (four) times daily. 08/28/21  Yes [provider]  dorzolamide (TRUSOPT) 2 % ophthalmic solution Place 1 drop into the right eye 2 (two) times daily.    Yes [provider]  insulin aspart (NOVOLOG) 100 UNIT/ML injection Inject 30-50 Units into the skin daily. In pump.  [place] and bolus before each meal   Yes [provider]  insulin glargine (LANTUS) 100 UNIT/ML injection Inject 15 Units into the skin as needed.    Yes [provider]  latanoprost (XALATAN) 0.005 % ophthalmic solution Place 1 drop into the right eye at bedtime.    Yes [provider]  levothyroxine (SYNTHROID) 75 MCG tablet Take 75 mcg by mouth daily before breakfast.   Yes [provider]  lisinopril (PRINIVIL,ZESTRIL) 5 MG tablet Take 5 mg by mouth at bedtime.    Yes [provider]  Omega-3 Fatty Acids (FISH OIL PO) Take 1 capsule by mouth daily.    Yes [provider]  timolol (TIMOPTIC) 0.5 % ophthalmic solution Place 1 drop into both eyes daily. 12/19/21  Yes [provider]  traZODone (DESYREL) 50 MG tablet Take 50 mg by mouth at bedtime.   Yes [provider]  timolol (BETIMOL) 0.5 % ophthalmic solution Place 1 drop into both eyes 2 (two) times daily. Patient not taking: Reported on  02/14/2022    [provider]  No Known Allergies Review of Systems  Neurological:  Positive for weakness.    Physical Exam Cardiovascular:     Rate and Rhythm: Normal rate.  Pulmonary:     Effort: Pulmonary effort is normal.  Musculoskeletal:     Comments: Generalized weakness  Skin:    General: Skin is warm and dry.  Neurological:     Mental Status: He is alert. He is confused.     Vital Signs: BP (!) 109/98 (BP Location: Left Arm)   Pulse 63   Temp (!) 97.5 F (36.4 C) (Oral)   Resp 19   Ht 5\' 2"  (1.575 m)   Wt 62.9 kg   SpO2 98%   BMI 25.36 kg/m  Pain Scale: 0-10   Pain Score: 0-No pain   SpO2: SpO2: 98 % O2 Device:SpO2: 98 % O2 Flow Rate: .   IO: Intake/output summary:  Intake/Output Summary (Last 24 hours) at 02/15/2022 1056 Last data filed at 02/15/2022 0830 Gross per 24 hour  Intake 2604.74 ml  Output 700 ml  Net 1904.74 ml    LBM: Last BM Date : 02/14/22 Baseline Weight: Weight: 62.3 kg Most recent weight: Weight: 62.9 kg     Palliative Assessment/Data: 50 %   Discussed with Dr 14/06/23, diabetic educator and hospice Liaison/Misty    This nurse practitioner informed  the patient/family and the attending that I will be out of the hospital until Monday morning.  If the patient is still hospitalized I will follow-up at that time.  Call palliative medicine team phone # [phone] with questions or concerns.  Time In: 0900 Time Out: 1030 Time Total: 90 minutes Greater than 50%  of this time was spent counseling and coordinating care related to the above assessment and plan.  Signed by: [phone], NP   Please contact Palliative Medicine Team phone at [phone] for questions and concerns.  For individual provider: See [phone]

## 2022-02-15 NOTE — Care Management Important Message (Signed)
Important Message  Patient Details  Name: Logan Strong MRN: [id number] Date of Birth: 28-Dec-1931   Medicare Important Message Given:  N/A - LOS <3 / Initial given by admissions     [name] 02/15/2022, 2:12 PM

## 2022-02-15 NOTE — Inpatient Diabetes Management (Addendum)
Inpatient Diabetes Program Recommendations  AACE/ADA: New Consensus Statement on Inpatient Glycemic Control (2015)  Target Ranges:  Prepandial:   less than 140 mg/dL      Peak postprandial:   less than 180 mg/dL (1-2 hours)      Critically ill patients:  140 - 180 mg/dL   Lab Results  Component Value Date   GLUCAP 137 (H) 02/15/2022   HGBA1C 7.7 (H) 02/11/2022    Review of Glycemic Control Diabetes history: DM1 (makes NO insulin; requires basal, correction, and carb coverage insulin) Outpatient Diabetes medications: [name] with Novolog; Lantus 15 units daily when not using pump Current orders for Inpatient glycemic control:  Transition orders for [name] 15 QD, Novolog 0-15 units TID and HS   Inpatient Diabetes Program Recommendations:    Please decrease above transition ordered to:  [name] 10 units QD (give 2 hrs prior to turning off drip) Novolog 0-9 units TID and 0-5 QHS Novolog 3 units TID  Addendum_0 :03:  Called Dr. Sherren Mocha office and spoke with his nurse.  She is going to call me back with recommendations for DC.  Explained he has had 2 admissions this week for DKA and is not safe to operate insulin pump at home.  He is still unable to remember his pin for his pump and is locked out.  [name] is on lower abdomen.  He does not have a receiver for this and his glucose readings go to his pump.  He has the [place] coming this month.  He is unsure if he will be getting the receiver.  He will need this with the [place] so he can view his glucose trends.    He has a functioning glucometer with strips available at home.  Met with him and his 2 daughters.   He has Novolog vials at home and out dated Lantus vials.  He has plenty of insulin syringes.  He will need a prescription for Lantus at DC.  He uses the [place] for medications but states he can also pick up medications at [place] using his [facility].    Educated daughters on basal/bolus insulin injections using  an insulin syringe.  One of his daughters is a Marine scientist and is familiar with insulin injections.  Educated on the need for fasting, preprandial and HS blood sugars.  He has used a sliding scale in the past and daughter was able to explain to me how a sliding scale works.  He will also need additional meal coverage TID with his meals.  She was able to explain that she would add the meal coverage to the sliding scale result and administer the correction and the meal coverage together.    Educated on hypo and hyperglycemia, signs, symptoms and treatments.  They have glucagon (which needs to be reconstituted), Glucose tabs and glucose gel.  Educated on G-Voke which is a new rescue injectable for hypoglycemia and is very easy to use and does not require reconstitution.  If he were to have hypoglycemia, be unresponsive or unable to swallow they could administer this in his leg.  Explained how this works and how to use.   Patient and daughters are in agreement with plan.  He should follow up with Dr. Honor Junes asap.  Patient lives alone.  Daughters state he will not be staying alone moving forward.   Awaiting call back from endo office.  If no return call please consider for DC:  Lantus vial order# [id number] GVoke hypopen-2 pack--order # [id number]  Lantus 13 units QHS Novolog 0-9 units TID  Novolog 3 units TID with meals     Will continue to follow while inpatient.  Thank you, Reche Dixon, MSN, [place] Diabetes Coordinator Inpatient Diabetes Program [phone] (team pager from 8a-5p)

## 2022-02-15 NOTE — Progress Notes (Signed)
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 Resolved - dm educator consulted, will transition from pump at home to basal/bolus, starting that here - stable for discharge today, daughter insists on one more night in hospital, I shared I will allow that but will absolutely need to discharge tomorrow provided he remains medically ready as he is currently medically stable for discharge  # End-of-life care Palliative involved, hospice referred  # NSTEMI Recent hospitalization for such. Troponin elevated but much lower than prior. Cons mgmt pursued at last hospitalization as patient and family are pursuing a palliative approach - cont asa, statin, plavix  # HTN Here normotensive, home meds on hold  # AKI on ckd 3a Resolved with hydration - monitor   DVT prophylaxis: lovenox Code Status: dnr Family Communication: daughters updated telephonically  Level of care: Telemetry Medical Status is: Inpatient     Consultants:  palliative  Procedures: none  Antimicrobials:  none    Subjective: Feeling fine  Objective: Vitals:   02/15/22 0600 02/15/22 0800 02/15/22 0815 02/15/22 1200  BP: (!) 94/36 (!) 88/47 (!) 109/98 (!) 127/57  Pulse: 68 61 63 79  Resp: 19 19 19  (!) 21  Temp:   (!) 97.5 F (36.4 C) 97.7 F (36.5 C)  TempSrc:   Oral Oral   SpO2: 94% 96% 98% 93%  Weight:      Height:        Intake/Output Summary (Last 24 hours) at 02/15/2022 1232 Last data filed at 02/15/2022 1219 Gross per 24 hour  Intake 3152.67 ml  Output 700 ml  Net 2452.67 ml   Filed Weights   02/14/22 1205 02/15/22 0117  Weight: 62.3 kg 62.9 kg    Examination:  General exam: Appears calm and comfortable  Respiratory system: Clear to auscultation. Respiratory effort normal. Cardiovascular system: S1 & S2 heard, RRR. No JVD, murmurs, rubs, gallops or clicks. No pedal edema. Gastrointestinal system: Abdomen is nondistended, soft and nontender. No organomegaly or masses felt. Normal bowel sounds heard. Central nervous system: Alert and oriented. No focal neurological deficits. Extremities: Symmetric 5 x 5 power. Skin: No rashes, lesions or ulcers Psychiatry: Judgement and insight appear normal. Mood & affect appropriate.     Data Reviewed: I have personally reviewed following labs and imaging studies  CBC: Recent Labs  Lab 02/11/22 0846 02/12/22 0310 02/13/22 0432 02/14/22 1219 02/15/22 0309  WBC 11.9* 14.7* 8.3 11.8* 12.6*  NEUTROABS  --   --   --  10.7*  --   HGB 12.0* 10.3* 10.9* 12.0* 10.3*  HCT 37.8* 30.1* 33.3* 38.2* 30.2*  MCV 94.5 87.5 89.3 95.5 88.8  PLT 228 201 179 226 173   Basic Metabolic Panel: Recent Labs  Lab 02/14/22 1225 02/14/22 2249 02/15/22 0309 02/15/22 0731 02/15/22 1052  NA 131* 134* 138 136 135  K 6.3* 3.7 3.8  3.8 3.9  CL 96* 104 107 109 105  CO2 10* 21* 23 20* 21*  GLUCOSE 694* 284* 164* 129* 129*  BUN 44* 44* 42* 37* 37*  CREATININE 2.12* 1.93* 1.73* 1.59* 1.59*  CALCIUM 8.9 8.7* 8.5* 8.1* 8.6*  MG  --   --  1.6*  --   --   PHOS  --   --  3.0  --   --    GFR: Estimated Creatinine Clearance: 24.8 mL/min (A) (by C-G formula based on SCr of 1.59 mg/dL (H)). Liver Function Tests: Recent Labs  Lab 02/11/22 0846  AST 26  ALT 14  ALKPHOS 119  BILITOT 1.3*  PROT 7.1  ALBUMIN 3.7   No  results for input(s): "LIPASE", "AMYLASE" in the last 168 hours. No results for input(s): "AMMONIA" in the last 168 hours. Coagulation Profile: No results for input(s): "INR", "PROTIME" in the last 168 hours. Cardiac Enzymes: No results for input(s): "CKTOTAL", "CKMB", "CKMBINDEX", "TROPONINI" in the last 168 hours. BNP (last 3 results) No results for input(s): "PROBNP" in the last 8760 hours. HbA1C: No results for input(s): "HGBA1C" in the last 72 hours. CBG: Recent Labs  Lab 02/15/22 0550 02/15/22 0656 02/15/22 0812 02/15/22 1031 02/15/22 1215  GLUCAP 129* 137* 108* 97 116*   Lipid Profile: No results for input(s): "CHOL", "HDL", "LDLCALC", "TRIG", "CHOLHDL", "LDLDIRECT" in the last 72 hours. Thyroid Function Tests: Recent Labs    02/14/22 1219  TSH 2.223  FREET4 1.20*   Anemia Panel: No results for input(s): "VITAMINB12", "FOLATE", "FERRITIN", "TIBC", "IRON", "RETICCTPCT" in the last 72 hours. Urine analysis:    Component Value Date/Time   COLORURINE STRAW (A) 02/11/2022 1050   APPEARANCEUR CLEAR (A) 02/11/2022 1050   LABSPEC 1.023 02/11/2022 1050   PHURINE 5.0 02/11/2022 1050   GLUCOSEU >=500 (A) 02/11/2022 1050   HGBUR NEGATIVE 02/11/2022 1050   BILIRUBINUR NEGATIVE 02/11/2022 1050   KETONESUR 20 (A) 02/11/2022 1050   PROTEINUR NEGATIVE 02/11/2022 1050   NITRITE NEGATIVE 02/11/2022 1050   LEUKOCYTESUR NEGATIVE 02/11/2022 1050   Sepsis Labs: @LABRCNTIP (procalcitonin:4,lacticidven:4)  ) Recent Results (from the past 240 hour(s))  MRSA Next Gen by PCR, Nasal     Status: None   Collection Time: 02/11/22  3:34 PM   Specimen: Nasal Mucosa; Nasal Swab  Result Value Ref Range Status   MRSA by PCR Next Gen NOT DETECTED NOT DETECTED Final    Comment: (NOTE) The GeneXpert MRSA Assay (FDA approved for NASAL specimens only), is one component of a comprehensive MRSA colonization surveillance program. It is not intended to diagnose MRSA infection nor to guide or  monitor treatment for MRSA infections. Test performance is not FDA approved in patients less than 37 years old. Performed at [facility], [address] [place], [place]   MRSA Next Gen by PCR, Nasal     Status: None   Collection Time: 02/14/22 11:47 PM   Specimen: Nasal Mucosa; Nasal Swab  Result Value Ref Range Status   MRSA by PCR Next Gen NOT DETECTED NOT DETECTED Final    Comment: (NOTE) The GeneXpert MRSA Assay (FDA approved for NASAL specimens only), is one component of a comprehensive MRSA colonization surveillance program. It is not intended to diagnose MRSA infection nor to guide or monitor treatment for MRSA infections. Test performance is not FDA approved in patients less than 68 years old. Performed at [facility], [address] [place], [place]  Radiology Studies: CT CHEST WO CONTRAST  Result Date: 02/14/2022 CLINICAL DATA:  Weakness.  Recent ketoacidosis.  Possible pneumonia. EXAM: CT CHEST WITHOUT CONTRAST TECHNIQUE: Multidetector CT imaging of the chest was performed following the standard protocol without IV contrast. RADIATION DOSE REDUCTION: This exam was performed according to the departmental dose-optimization program which includes automated exposure control, adjustment of the mA and/or kV according to patient size and/or use of iterative reconstruction technique. COMPARISON:  Plain film earlier today.  No comparison CT. FINDINGS: Cardiovascular: Mild limitations secondary to motion and patient arm position, not raised above the head. There is also lack of IV contrast. Dual lead pacer. Aortic atherosclerosis. Mild cardiomegaly with multivessel coronary artery calcification. Mediastinum/Nodes: Node within the subcarinal station with extension into the azygoesophageal recess measures 1.4 cm on 71/2. Hilar regions poorly evaluated without intravenous contrast. Lungs/Pleura: Small, right larger than left pleural  effusions. Mild, right greater than left biapical pleuroparenchymal scarring. Dependent right greater than left base airspace disease. Upper Abdomen: Moderate hepatic steatosis. Artifact degraded upper abdominal evaluation including secondary to EKG wires and leads. Normal imaged portions of the spleen. Pancreatic atrophy is likely within normal variation for age. Proximal gastric underdistention. Normal right adrenal gland and left kidney. Left adrenal thickening suggests hyperplasia. Upper pole right renal 1.6 cm fluid density lesion is likely a cyst . In the absence of clinically indicated signs/symptoms require(s) no independent follow-up. Musculoskeletal: Multiple nonacute bilateral rib fractures. Many of these are incompletely healed. Posterior ninth and tenth right rib fractures may be acute/subacute including on 121/3. IMPRESSION: 1. Multifactorial degradation, as detailed above. 2. Small right-greater-than-left pleural effusions. 3. Bibasilar airspace disease. On the left, compressive atelectasis. On the right, primarily felt to represent atelectasis. Concurrent infection or aspiration cannot be excluded. 4. Coronary artery atherosclerosis. Aortic Atherosclerosis (ICD10-I70.0). 5. Mild mediastinal adenopathy is most likely reactive. 6. Hepatic steatosis 7. Multiple bilateral rib fractures, including some that are incompletely healed and 2 right-sided fractures which may be acute/subacute. Electronically Signed   By: Jeronimo Greaves M.D.   On: 02/14/2022 16:19   DG Chest 1 View  Result Date: 02/14/2022 CLINICAL DATA:  Weakness EXAM: CHEST  1 VIEW COMPARISON:  02/11/2022 FINDINGS: Heart size is normal. Left chest multi lead pacer. Mild diffuse bilateral interstitial pulmonary opacity. The visualized skeletal structures are unremarkable. IMPRESSION: Mild diffuse bilateral interstitial pulmonary opacity, consistent with edema or atypical/viral infection. No focal airspace opacity. Electronically Signed   By:  Jearld Lesch M.D.   On: 02/14/2022 12:57        Scheduled Meds:  aspirin EC  81 mg Oral QHS   brimonidine  1 drop Both Eyes BID   calcium-vitamin D  1 tablet Oral QHS   Chlorhexidine Gluconate Cloth  6 each Topical Daily   citalopram  20 mg Oral Daily   clopidogrel  75 mg Oral Daily   dorzolamide  1 drop Right Eye BID   enoxaparin (LOVENOX) injection  30 mg Subcutaneous Q24H   insulin aspart  0-5 Units Subcutaneous QHS   insulin aspart  0-9 Units Subcutaneous TID WC   insulin aspart  3 Units Subcutaneous TID WC   insulin glargine-yfgn  10 Units Subcutaneous Daily   latanoprost  1 drop Right Eye QHS   levothyroxine  75 mcg Oral Q0600   omega-3 acid ethyl esters  2 g Oral Daily   timolol  1 drop Both Eyes Daily   traZODone  50 mg Oral QHS   Continuous Infusions:   LOS: 1  day     Silvano BilisNoah B Lealer [place]land, MD [facility]   If 7PM-7AM, please contact night-coverage [email or web address] Password [email or web address] 02/15/2022, 12:32 PM

## 2022-02-15 NOTE — Progress Notes (Signed)
Notified NP Jon Billings about low blood pressure, 1 liter of LR ordered.

## 2022-02-15 NOTE — Progress Notes (Signed)
       CROSS COVER NOTE  NAME: Logan Strong MRN: [id number] DOB : [birth year]    Date of Service   02/15/2022  HPI/Events of Note   DKA with AKI  Assessment and  Interventions   Assessment: Hypotension is setting of metabolic acidosis Urinary retention Plan: Given additional 2 liters of IV fluid bolus Transition off insulin drip - 15 units semglee ordered+ moderate dose SSI AC and HS In and out cath     Donnie Mesa NP [facility]

## 2022-02-15 NOTE — Progress Notes (Signed)
Patients daughter Inge Rise informed staff she will bring health care power of attorney paper work later today.

## 2022-02-16 DIAGNOSIS — E1010 Type 1 diabetes mellitus with ketoacidosis without coma: Secondary | ICD-10-CM | POA: Diagnosis not present

## 2022-02-16 LAB — GLUCOSE, CAPILLARY: Glucose-Capillary: 257 mg/dL — ABNORMAL HIGH (ref 70–99)

## 2022-02-16 MED ORDER — INSULIN GLARGINE 100 UNIT/ML ~~LOC~~ SOLN: SUBCUTANEOUS | 3 refills | Status: AC

## 2022-02-16 MED ORDER — INSULIN SYRINGES (DISPOSABLE) U-100 0.3 ML MISC: 100.0000 | 1 refills | Status: AC

## 2022-02-16 MED ORDER — INSULIN ASPART 100 UNIT/ML ~~LOC~~ SOLN: SUBCUTANEOUS | 11 refills | Status: AC

## 2022-02-16 MED ORDER — GVOKE HYPOPEN 2-PACK 1 MG/0.2ML ~~LOC~~ SOAJ: 1.0000 mg | SUBCUTANEOUS | 3 refills | Status: AC | PRN

## 2022-02-16 MED ORDER — INSULIN GLARGINE-YFGN 100 UNIT/ML ~~LOC~~ SOLN
13.0000 [IU] | Freq: Every day | SUBCUTANEOUS | Status: DC
  Administered 2022-02-16: 13 [IU] via SUBCUTANEOUS
  Filled 2022-02-16: qty 0.13

## 2022-02-16 NOTE — TOC Transition Note (Signed)
Transition of Care [facility]) - CM/SW Discharge Note   Patient Details  Name: ANSEN SAYEGH MRN: [id number] Date of Birth: 1931-04-22  [facility] CM/SW Contact:  Tempie Hoist, LCSWA Phone Number: 02/16/2022, 2:52 PM   Clinical Narrative:     Patient will discharge home with [facility].  Final next level of care: Home w Hospice Care Barriers to Discharge: Barriers Resolved   Patient Goals and CMS Choice Patient states their goals for this hospitalization and ongoing recovery are:: return home CMS Medicare.gov Compare Post Acute Care list provided to:: Patient Represenative (must comment) Choice offered to / list presented to : Adult Children  Discharge Placement                Patient to be transferred to facility by: Family      Discharge Plan and Services                 [facility]                 Representative spoke with at [facility] Agency: [place] [facility] at [facility]  Social Determinants of Health (SDOH) Interventions     Readmission Risk Interventions     No data to display

## 2022-02-16 NOTE — Progress Notes (Signed)
Patient OOF in stable condition via w/c with family. Discharge instructions provided by Ellison Hughs, RN

## 2022-02-16 NOTE — Discharge Instructions (Addendum)
You have an appointment with Dr. Gershon Crane on Monday 02/19/22 at 1:00 pm.  Correction coverage: CBG 70 - 120: 0 units CBG 121 - 150: 1 unit CBG 151 - 200: 2 units CBG 201 - 250: 3 units CBG 251 - 300: 5 units CBG 301 - 350: 7 units CBG 351 - 400 9 units CBG > 400 call doctor

## 2022-02-16 NOTE — Discharge Summary (Signed)
Logan Strong [id number] DOB: 04/26/1931 DOA: 02/14/2022  PCP: Tracie Harrier, MD  Admit date: 02/14/2022 Discharge date: 02/16/2022  Time spent: 35 minutes  Recommendations for Outpatient Follow-up:  Endo f/u 12/11 @ 1 PM PCP f/u Establishing w/ home hospice     Discharge Diagnoses:  Principal Problem:   Diabetic ketoacidosis without coma associated with type 1 diabetes mellitus ([place]) Active Problems:   Acute renal failure superimposed on stage 3a chronic kidney disease ([place])   Essential hypertension   Coronary artery disease   Hypothyroidism   Elevated troponin   Depression   Hyponatremia   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 male with medical history significant for insulin-dependent diabetes mellitus with complications of stage III chronic kidney disease, coronary artery disease status post recent NSTEMI treated medically, hypothyroidism, history of sick sinus syndrome status post permanent pacemaker placement, chronic diastolic dysfunction CHF with last known LVEF of 45 to 50%, who was discharged from the hospital on 12/5 after treatment for DKA and an NSTEMI which was treated medically.  Patient was discharged home with home hospice. Patient's daughter states that since his discharge he has refused all oral intake and appears to be weaker than normal.  She also notes that he has been confused which is not his baseline.  Daughter also notes that his blood sugars have been high at home. Patient was seen by the diabetic educator in the ER who noted that his insulin pump was kinked and nonfunctional. Patient denies having any chest or abdominal pain, no nausea, no vomiting, no  cough, no fever, no chills, no headache, no dizziness, no lightheadedness, no leg swelling, no blurred vision,no focal deficit.Marland Kitchen  Hospital Course:  Patient presents with DKA secondary to pump failure (hose was not properly attached). This in setting of end-of-life care with recent decision made to transition to home hospice. Was recently hospitalized for nstemi, conservative approach taken. Here DKA quickly resolved with fluids and insulin. AKI also resolved with fluids. DM educator RN, in consultation w/ patient's outpt endocrinologist, transitioned patient to basal/bolus glargine/novolog with plan for close endo f/u. Hospice will be coming to the home to initiate services tomorrow. Insulin regimen carefully reviewed with daughter who is a Marine scientist.   Procedures: none   Consultations: Dm educator  Discharge Exam: Vitals:   02/16/22 0442 02/16/22 0733  BP: (!) 140/62 (!) 126/56  Pulse: 73 79  Resp: 17 16  Temp: (!) 97.5 F (36.4 C) 97.8 F (36.6 C)  SpO2: 94% 90%    General: NAD Cardiovascular: RRR Respiratory: CTAB  Discharge Instructions   Discharge Instructions     Diet - low sodium heart healthy   Complete by: As directed    Increase activity slowly   Complete by: As directed       Allergies as of 02/16/2022   No Known Allergies      Medication List     STOP taking these medications    timolol 0.5 % ophthalmic solution Commonly known as: BETIMOL       TAKE these medications    alendronate 35 MG tablet Commonly known as: FOSAMAX Take 35 mg by mouth once a week.  aspirin 81 MG tablet Take 81 mg by mouth at bedtime.   brimonidine 0.2 % ophthalmic solution Commonly known as: ALPHAGAN Place 1 drop into both eyes in the morning and at bedtime.   CALCIUM CARBONATE-VITAMIN D PO Take 1 tablet by mouth at bedtime.   citalopram 20 MG tablet Commonly known as: CELEXA Take 20 mg by mouth daily.   clopidogrel 75 MG tablet Commonly known as: PLAVIX Take  1 tablet (75 mg total) by mouth daily.   cyanocobalamin 1000 MCG/ML injection Commonly known as: VITAMIN B12 Inject 1,000 mcg into the muscle once.   diclofenac Sodium 1 % Gel Commonly known as: VOLTAREN Apply 2 g topically 4 (four) times daily.   dorzolamide 2 % ophthalmic solution Commonly known as: TRUSOPT Place 1 drop into the right eye 2 (two) times daily.   FISH OIL PO Take 1 capsule by mouth daily.   Gvoke HypoPen 2-Pack 1 MG/0.2ML Soaj Generic drug: Glucagon Inject 1 mg into the skin as needed.   insulin aspart 100 UNIT/ML injection Commonly known as: novoLOG 2 units with meals plus sliding scale What changed:  how much to take how to take this when to take this additional instructions   insulin glargine 100 UNIT/ML injection Commonly known as: LANTUS 10 units nightly What changed:  how much to take how to take this when to take this reasons to take this additional instructions   Insulin Syringes (Disposable) U-100 0.3 ML Misc 100 each by Does not apply route as directed.   latanoprost 0.005 % ophthalmic solution Commonly known as: XALATAN Place 1 drop into the right eye at bedtime.   levothyroxine 75 MCG tablet Commonly known as: SYNTHROID Take 75 mcg by mouth daily before breakfast.   lisinopril 5 MG tablet Commonly known as: ZESTRIL Take 5 mg by mouth at bedtime.   timolol 0.5 % ophthalmic solution Commonly known as: TIMOPTIC Place 1 drop into both eyes daily.   traZODone 50 MG tablet Commonly known as: DESYREL Take 50 mg by mouth at bedtime.       No Known Allergies  Follow-up Information     Tracie Harrier, MD Follow up.   Specialty: Internal Medicine Contact information: [address] [place] [address] [phone]                  The results of significant diagnostics from this hospitalization (including imaging, microbiology, ancillary and laboratory) are listed below for  reference.    Significant Diagnostic Studies: CT CHEST WO CONTRAST  Result Date: 02/14/2022 CLINICAL DATA:  Weakness.  Recent ketoacidosis.  Possible pneumonia. EXAM: CT CHEST WITHOUT CONTRAST TECHNIQUE: Multidetector CT imaging of the chest was performed following the standard protocol without IV contrast. RADIATION DOSE REDUCTION: This exam was performed according to the departmental dose-optimization program which includes automated exposure control, adjustment of the mA and/or kV according to patient size and/or use of iterative reconstruction technique. COMPARISON:  Plain film earlier today.  No comparison CT. FINDINGS: Cardiovascular: Mild limitations secondary to motion and patient arm position, not raised above the head. There is also lack of IV contrast. Dual lead pacer. Aortic atherosclerosis. Mild cardiomegaly with multivessel coronary artery calcification. Mediastinum/Nodes: Node within the subcarinal station with extension into the azygoesophageal recess measures 1.4 cm on 71/2. Hilar regions poorly evaluated without intravenous contrast. Lungs/Pleura: Small, right larger than left pleural effusions. Mild, right greater than left biapical pleuroparenchymal scarring. Dependent right greater than left base airspace disease. Upper Abdomen:  Moderate hepatic steatosis. Artifact degraded upper abdominal evaluation including secondary to EKG wires and leads. Normal imaged portions of the spleen. Pancreatic atrophy is likely within normal variation for age. Proximal gastric underdistention. Normal right adrenal gland and left kidney. Left adrenal thickening suggests hyperplasia. Upper pole right renal 1.6 cm fluid density lesion is likely a cyst . In the absence of clinically indicated signs/symptoms require(s) no independent follow-up. Musculoskeletal: Multiple nonacute bilateral rib fractures. Many of these are incompletely healed. Posterior ninth and tenth right rib fractures may be acute/subacute  including on 121/3. IMPRESSION: 1. Multifactorial degradation, as detailed above. 2. Small right-greater-than-left pleural effusions. 3. Bibasilar airspace disease. On the left, compressive atelectasis. On the right, primarily felt to represent atelectasis. Concurrent infection or aspiration cannot be excluded. 4. Coronary artery atherosclerosis. Aortic Atherosclerosis (ICD10-I70.0). 5. Mild mediastinal adenopathy is most likely reactive. 6. Hepatic steatosis 7. Multiple bilateral rib fractures, including some that are incompletely healed and 2 right-sided fractures which may be acute/subacute. Electronically Signed   By: Abigail Miyamoto M.D.   On: 02/14/2022 16:19   DG Chest 1 View  Result Date: 02/14/2022 CLINICAL DATA:  Weakness EXAM: CHEST  1 VIEW COMPARISON:  02/11/2022 FINDINGS: Heart size is normal. Left chest multi lead pacer. Mild diffuse bilateral interstitial pulmonary opacity. The visualized skeletal structures are unremarkable. IMPRESSION: Mild diffuse bilateral interstitial pulmonary opacity, consistent with edema or atypical/viral infection. No focal airspace opacity. Electronically Signed   By: Delanna Ahmadi M.D.   On: 02/14/2022 12:57   ECHOCARDIOGRAM COMPLETE  Result Date: 02/12/2022    ECHOCARDIOGRAM REPORT   Patient Name:   JAKAIDEN JABS Date of Exam: 02/12/2022 Medical Rec #:  [id number]       Height:       66.0 in Accession #:    [id number]      Weight:       137.3 lb Date of Birth:  Jan 12, 1932       BSA:          1.705 m Patient Age:    52 years        BP:           99/61 mmHg Patient Gender: M               HR:           69 bpm. Exam Location:  [facility] Procedure: 2D Echo, Color Doppler and Cardiac Doppler Indications:     I21.4 NSTEMI  History:         Patient has prior history of Echocardiogram examinations, most                  recent 06/20/2019. CAD, Pacemaker, CKD; Risk Factors:Diabetes                  and Sleep Apnea.  Sonographer:     Charmayne Sheer Referring Phys:  [id number] [place]  TANG Diagnosing Phys: Serafina Royals MD  Sonographer Comments: Suboptimal subcostal window. IMPRESSIONS  1. Left ventricular ejection fraction, by estimation, is 45 to 50%. The left ventricle has mildly decreased function. The left ventricle demonstrates regional wall motion abnormalities (see scoring diagram/findings for description). Left ventricular diastolic parameters were normal.  2. Right ventricular systolic function is normal. The right ventricular size is normal.  3. Left atrial size was mild to moderately dilated.  4. Right atrial size was mildly dilated.  5. The mitral valve is degenerative. Moderate mitral valve regurgitation.  6. The aortic valve is  normal in structure. Aortic valve regurgitation is trivial. FINDINGS  Left Ventricle: Left ventricular ejection fraction, by estimation, is 45 to 50%. The left ventricle has mildly decreased function. The left ventricle demonstrates regional wall motion abnormalities. The left ventricular internal cavity size was normal in size. There is no left ventricular hypertrophy. Left ventricular diastolic parameters were normal. Right Ventricle: The right ventricular size is normal. No increase in right ventricular wall thickness. Right ventricular systolic function is normal. Left Atrium: Left atrial size was mild to moderately dilated. Right Atrium: Right atrial size was mildly dilated. Pericardium: There is no evidence of pericardial effusion. Mitral Valve: The mitral valve is degenerative in appearance. Moderate mitral valve regurgitation. Tricuspid Valve: The tricuspid valve is normal in structure. Tricuspid valve regurgitation is mild. Aortic Valve: The aortic valve is normal in structure. Aortic valve regurgitation is trivial. Aortic valve mean gradient measures 4.0 mmHg. Aortic valve peak gradient measures 6.4 mmHg. Aortic valve area, by VTI measures 2.16 cm. Pulmonic Valve: The pulmonic valve was normal in structure. Pulmonic valve regurgitation is  trivial. Aorta: The aortic root and ascending aorta are structurally normal, with no evidence of dilitation. IAS/Shunts: No atrial level shunt detected by color flow Doppler.  LEFT VENTRICLE PLAX 2D LVIDd:         3.40 cm   Diastology LVIDs:         2.50 cm   LV e' medial:    3.92 cm/s LV PW:         0.90 cm   LV E/e' medial:  35.7 LV IVS:        0.70 cm   LV e' lateral:   6.31 cm/s LVOT diam:     2.00 cm   LV E/e' lateral: 22.2 LV SV:         57 LV SV Index:   33 LVOT Area:     3.14 cm  RIGHT VENTRICLE RV Basal diam:  3.00 cm RV S prime:     11.60 cm/s TAPSE (M-mode): 1.3 cm LEFT ATRIUM             Index        RIGHT ATRIUM           Index LA diam:        4.00 cm 2.35 cm/m   RA Area:     11.90 cm LA Vol (A2C):   43.9 ml 25.75 ml/m  RA Volume:   25.90 ml  15.19 ml/m LA Vol (A4C):   44.3 ml 25.99 ml/m LA Biplane Vol: 45.9 ml 26.93 ml/m  AORTIC VALVE                    PULMONIC VALVE AV Area (Vmax):    1.89 cm     PV Vmax:       0.67 m/s AV Area (Vmean):   1.89 cm     PV Vmean:      48.400 cm/s AV Area (VTI):     2.16 cm     PV VTI:        0.129 m AV Vmax:           126.00 cm/s  PV Peak grad:  1.8 mmHg AV Vmean:          91.500 cm/s  PV Mean grad:  1.0 mmHg AV VTI:            0.262 m AV Peak Grad:      6.4 mmHg AV Mean  Grad:      4.0 mmHg LVOT Vmax:         75.70 cm/s LVOT Vmean:        55.000 cm/s LVOT VTI:          0.180 m LVOT/AV VTI ratio: 0.69  AORTA Ao Root diam: 3.00 cm MITRAL VALVE                TRICUSPID VALVE MV Area (PHT): 2.64 cm     TR Peak grad:   27.2 mmHg MV Decel Time: 287 msec     TR Vmax:        261.00 cm/s MV E velocity: 140.00 cm/s MV A velocity: 183.00 cm/s  SHUNTS MV E/A ratio:  0.77         Systemic VTI:  0.18 m                             Systemic Diam: 2.00 cm Serafina Royals MD Electronically signed by Serafina Royals MD Signature Date/Time: 02/12/2022/11:39:24 AM    Final    DG Chest 2 View  Result Date: 02/11/2022 CLINICAL DATA:  Dizziness and weakness for 2 days. EXAM: CHEST  - 2 VIEW COMPARISON:  June 19, 2019 FINDINGS: The heart size and mediastinal contours are stable. Cardiac pacemaker is stable. No focal infiltrate, pulmonary edema, or pleural effusion noted. The visualized skeletal structures are stable. IMPRESSION: No active cardiopulmonary disease. Electronically Signed   By: Abelardo Diesel M.D.   On: 02/11/2022 09:58   CT HEAD WO CONTRAST (5MM)  Result Date: 02/11/2022 CLINICAL DATA:  Mental status change with unknown cause EXAM: CT HEAD WITHOUT CONTRAST TECHNIQUE: Contiguous axial images were obtained from the base of the skull through the vertex without intravenous contrast. RADIATION DOSE REDUCTION: This exam was performed according to the departmental dose-optimization program which includes automated exposure control, adjustment of the mA and/or kV according to patient size and/or use of iterative reconstruction technique. COMPARISON:  05/25/2013 FINDINGS: Brain: No evidence of acute infarction, hemorrhage, hydrocephalus, extra-axial collection or mass lesion/mass effect. Generalized atrophy that is progressed from 2015. Chronic small vessel ischemia in the cerebral white matter. Vascular: No hyperdense vessel or unexpected calcification. Skull: Normal. Negative for fracture or focal lesion. Sinuses/Orbits: Bilateral cataract resection. IMPRESSION: 1. No acute finding. 2. Generalized atrophy that is progressed from 2015. Electronically Signed   By: Jorje Guild M.D.   On: 02/11/2022 09:32    Microbiology: Recent Results (from the past 240 hour(s))  MRSA Next Gen by PCR, Nasal     Status: None   Collection Time: 02/11/22  3:34 PM   Specimen: Nasal Mucosa; Nasal Swab  Result Value Ref Range Status   MRSA by PCR Next Gen NOT DETECTED NOT DETECTED Final    Comment: (NOTE) The GeneXpert MRSA Assay (FDA approved for NASAL specimens only), is one component of a comprehensive MRSA colonization surveillance program. It is not intended to diagnose MRSA infection nor  to guide or monitor treatment for MRSA infections. Test performance is not FDA approved in patients less than 79 years old. Performed at [facility], [address] [place], [place] [address]   MRSA Next Gen by PCR, Nasal     Status: None   Collection Time: 02/14/22 11:47 PM   Specimen: Nasal Mucosa; Nasal Swab  Result Value Ref Range Status   MRSA by PCR Next Gen NOT DETECTED NOT DETECTED Final    Comment: (NOTE)  The GeneXpert MRSA Assay (FDA approved for NASAL specimens only), is one component of a comprehensive MRSA colonization surveillance program. It is not intended to diagnose MRSA infection nor to guide or monitor treatment for MRSA infections. Test performance is not FDA approved in patients less than 76 years old. Performed at [facility], [address] [place], [place] [address]      Labs: Basic Metabolic Panel: Recent Labs  Lab 02/14/22 1225 02/14/22 2249 02/15/22 0309 02/15/22 0731 02/15/22 1052  NA 131* 134* 138 136 135  K 6.3* 3.7 3.8 3.8 3.9  CL 96* 104 107 109 105  CO2 10* 21* 23 20* 21*  GLUCOSE 694* 284* 164* 129* 129*  BUN 44* 44* 42* 37* 37*  CREATININE 2.12* 1.93* 1.73* 1.59* 1.59*  CALCIUM 8.9 8.7* 8.5* 8.1* 8.6*  MG  --   --  1.6*  --   --   PHOS  --   --  3.0  --   --    Liver Function Tests: Recent Labs  Lab 02/11/22 0846  AST 26  ALT 14  ALKPHOS 119  BILITOT 1.3*  PROT 7.1  ALBUMIN 3.7   No results for input(s): "LIPASE", "AMYLASE" in the last 168 hours. No results for input(s): "AMMONIA" in the last 168 hours. CBC: Recent Labs  Lab 02/11/22 0846 02/12/22 0310 02/13/22 0432 02/14/22 1219 02/15/22 0309  WBC 11.9* 14.7* 8.3 11.8* 12.6*  NEUTROABS  --   --   --  10.7*  --   HGB 12.0* 10.3* 10.9* 12.0* 10.3*  HCT 37.8* 30.1* 33.3* 38.2* 30.2*  MCV 94.5 87.5 89.3 95.5 88.8  PLT 228 201 179 226 173   Cardiac Enzymes: No results for input(s): "CKTOTAL", "CKMB", "CKMBINDEX", "TROPONINI" in the last 168  hours. BNP: BNP (last 3 results) No results for input(s): "BNP" in the last 8760 hours.  ProBNP (last 3 results) No results for input(s): "PROBNP" in the last 8760 hours.  CBG: Recent Labs  Lab 02/15/22 1215 02/15/22 1615 02/15/22 1757 02/15/22 2121 02/16/22 0735  GLUCAP 116* 223* 265* 225* 257*       Signed:  Silvano Bilis MD.  [facility] 02/16/2022, 11:05 AM

## 2022-02-16 NOTE — Progress Notes (Signed)
[facility]) Hospice hospital liaison note   Mr. Dawson was a previous referral for [facility] services at home that was admitted back to the hospital prior to initiation of services.   Have met with family at bedside and the plan is still for initiation of services once patient is discharged home. All questions answered with family and assessment visit will be planned likely for over the weekend per their request.   Please do not hesitate to reach out for any hospice related questions or concerns.   Thank you,  Jhonnie Garner, BSN, [employer] hospital RN [phone]

## 2022-02-16 NOTE — Inpatient Diabetes Management (Signed)
Inpatient Diabetes Program Recommendations  AACE/ADA: New Consensus Statement on Inpatient Glycemic Control   Target Ranges:  Prepandial:   less than 140 mg/dL      Peak postprandial:   less than 180 mg/dL (1-2 hours)      Critically ill patients:  140 - 180 mg/dL    Latest Reference Range & Units 02/15/22 06:56 02/15/22 08:12 02/15/22 10:31 02/15/22 12:15 02/15/22 16:15 02/15/22 17:57 02/15/22 21:21 02/16/22 07:35  Glucose-Capillary 70 - 99 mg/dL 115 (H) 520 (H) 97 802 (H) 223 (H) 265 (H) 225 (H) 257 (H)   Review of Glycemic Control  Diabetes history: DM1 (makes NO insulin; requires basal, correction, and carb coverage insulin) Outpatient Diabetes medications: [name] with Novolog; Lantus 15 units daily when not using pump Current orders for Inpatient glycemic control: [name] 13 units daily, Novolog 0-9 units TID with meals, Novolog 0-5 units QHS, Novolog 3 units TID with meals  Inpatient Diabetes Program Recommendations:    Outpatient DM: Per Dr. Gershon Crane (Endocrinologist), please discharge patient on Lantus 10 units daily and Novolog 2 units TID with meals. Would also recommend providing Novolog 0-9 units TID with meals for patient to use for correction. At discharge, please provide Rx for Lantus vials (#[id number]) and insulin syringes [phone]). Patient already has Novolog vials at home. Also please provide Rx for GVoke hypopen 2 pack (#[id number]).  NOTE: Dr. Gershon Crane returned call to Zerita Boers, RN with inpatient diabetes team yesterday evening.  Dr. Gershon Crane asked that patient be discharged on Lantus 10 units daily and Novolog 2 units TID with meals. Patient has an appointment scheduled with Dr. Gershon Crane for 1:00 pm on Monday February 19, 2022. Spoke with patient's daughter Eunice Blase over the phone. Informed her that my coworker spoke with Dr. Gershon Crane yesterday evening and that he made recommendations for insulin at discharge and patient has appointment to see Dr. Gershon Crane on  Monday February 19, 2022 at 1:00 pm.  Thanks, Orlando Penner, RN, MSN, CDCES Diabetes Coordinator Inpatient Diabetes Program [phone] (Team Pager from 8am to 5pm)

## 2022-02-19 DIAGNOSIS — M8589 Other specified disorders of bone density and structure, multiple sites: Secondary | ICD-10-CM | POA: Diagnosis not present

## 2022-02-19 DIAGNOSIS — E034 Atrophy of thyroid (acquired): Secondary | ICD-10-CM | POA: Diagnosis not present

## 2022-02-19 DIAGNOSIS — E10649 Type 1 diabetes mellitus with hypoglycemia without coma: Secondary | ICD-10-CM | POA: Diagnosis not present

## 2022-03-07 DIAGNOSIS — E10649 Type 1 diabetes mellitus with hypoglycemia without coma: Secondary | ICD-10-CM | POA: Diagnosis not present

## 2022-03-08 DIAGNOSIS — F3340 Major depressive disorder, recurrent, in remission, unspecified: Secondary | ICD-10-CM | POA: Diagnosis not present

## 2022-03-08 DIAGNOSIS — Z794 Long term (current) use of insulin: Secondary | ICD-10-CM | POA: Diagnosis not present

## 2022-03-08 DIAGNOSIS — I2510 Atherosclerotic heart disease of native coronary artery without angina pectoris: Secondary | ICD-10-CM | POA: Diagnosis not present

## 2022-03-08 DIAGNOSIS — R21 Rash and other nonspecific skin eruption: Secondary | ICD-10-CM | POA: Diagnosis not present

## 2022-03-08 DIAGNOSIS — I10 Essential (primary) hypertension: Secondary | ICD-10-CM | POA: Diagnosis not present

## 2022-03-08 DIAGNOSIS — D631 Anemia in chronic kidney disease: Secondary | ICD-10-CM | POA: Diagnosis not present

## 2022-03-08 DIAGNOSIS — E1065 Type 1 diabetes mellitus with hyperglycemia: Secondary | ICD-10-CM | POA: Diagnosis not present

## 2022-03-08 DIAGNOSIS — Z950 Presence of cardiac pacemaker: Secondary | ICD-10-CM | POA: Diagnosis not present

## 2022-03-08 DIAGNOSIS — Z87891 Personal history of nicotine dependence: Secondary | ICD-10-CM | POA: Diagnosis not present

## 2022-03-08 DIAGNOSIS — N1832 Chronic kidney disease, stage 3b: Secondary | ICD-10-CM | POA: Diagnosis not present

## 2022-03-08 DIAGNOSIS — N183 Chronic kidney disease, stage 3 unspecified: Secondary | ICD-10-CM | POA: Diagnosis not present

## 2022-03-08 DIAGNOSIS — Z1331 Encounter for screening for depression: Secondary | ICD-10-CM | POA: Diagnosis not present

## 2022-03-08 DIAGNOSIS — Z0000 Encounter for general adult medical examination without abnormal findings: Secondary | ICD-10-CM | POA: Diagnosis not present

## 2022-03-08 DIAGNOSIS — G4700 Insomnia, unspecified: Secondary | ICD-10-CM | POA: Diagnosis not present

## 2022-03-08 DIAGNOSIS — G4733 Obstructive sleep apnea (adult) (pediatric): Secondary | ICD-10-CM | POA: Diagnosis not present

## 2022-03-08 DIAGNOSIS — D649 Anemia, unspecified: Secondary | ICD-10-CM | POA: Diagnosis not present

## 2022-03-08 DIAGNOSIS — E1122 Type 2 diabetes mellitus with diabetic chronic kidney disease: Secondary | ICD-10-CM | POA: Diagnosis not present

## 2022-03-08 DIAGNOSIS — I129 Hypertensive chronic kidney disease with stage 1 through stage 4 chronic kidney disease, or unspecified chronic kidney disease: Secondary | ICD-10-CM | POA: Diagnosis not present

## 2022-03-09 DIAGNOSIS — R8290 Unspecified abnormal findings in urine: Secondary | ICD-10-CM | POA: Diagnosis not present

## 2022-03-28 DIAGNOSIS — E10649 Type 1 diabetes mellitus with hypoglycemia without coma: Secondary | ICD-10-CM | POA: Diagnosis not present

## 2022-03-28 DIAGNOSIS — E034 Atrophy of thyroid (acquired): Secondary | ICD-10-CM | POA: Diagnosis not present

## 2022-03-29 DIAGNOSIS — I429 Cardiomyopathy, unspecified: Secondary | ICD-10-CM | POA: Diagnosis not present

## 2022-03-29 DIAGNOSIS — G4733 Obstructive sleep apnea (adult) (pediatric): Secondary | ICD-10-CM | POA: Diagnosis not present

## 2022-03-29 DIAGNOSIS — I442 Atrioventricular block, complete: Secondary | ICD-10-CM | POA: Diagnosis not present

## 2022-03-29 DIAGNOSIS — E1065 Type 1 diabetes mellitus with hyperglycemia: Secondary | ICD-10-CM | POA: Diagnosis not present

## 2022-03-29 DIAGNOSIS — I5022 Chronic systolic (congestive) heart failure: Secondary | ICD-10-CM | POA: Diagnosis not present

## 2022-03-29 DIAGNOSIS — Z950 Presence of cardiac pacemaker: Secondary | ICD-10-CM | POA: Diagnosis not present

## 2022-03-29 DIAGNOSIS — I495 Sick sinus syndrome: Secondary | ICD-10-CM | POA: Diagnosis not present

## 2022-03-29 DIAGNOSIS — E782 Mixed hyperlipidemia: Secondary | ICD-10-CM | POA: Diagnosis not present

## 2022-03-29 DIAGNOSIS — I10 Essential (primary) hypertension: Secondary | ICD-10-CM | POA: Diagnosis not present

## 2022-04-03 DIAGNOSIS — M47812 Spondylosis without myelopathy or radiculopathy, cervical region: Secondary | ICD-10-CM | POA: Diagnosis not present

## 2022-04-03 DIAGNOSIS — G8929 Other chronic pain: Secondary | ICD-10-CM | POA: Diagnosis not present

## 2022-04-03 DIAGNOSIS — M542 Cervicalgia: Secondary | ICD-10-CM | POA: Diagnosis not present

## 2022-04-03 DIAGNOSIS — I700 Atherosclerosis of aorta: Secondary | ICD-10-CM | POA: Diagnosis not present

## 2022-04-03 DIAGNOSIS — M5030 Other cervical disc degeneration, unspecified cervical region: Secondary | ICD-10-CM | POA: Diagnosis not present

## 2022-04-05 DIAGNOSIS — L03116 Cellulitis of left lower limb: Secondary | ICD-10-CM | POA: Diagnosis not present

## 2022-04-05 DIAGNOSIS — L97522 Non-pressure chronic ulcer of other part of left foot with fat layer exposed: Secondary | ICD-10-CM | POA: Diagnosis not present

## 2022-04-05 DIAGNOSIS — B351 Tinea unguium: Secondary | ICD-10-CM | POA: Diagnosis not present

## 2022-04-05 DIAGNOSIS — L600 Ingrowing nail: Secondary | ICD-10-CM | POA: Diagnosis not present

## 2022-04-05 DIAGNOSIS — E1140 Type 2 diabetes mellitus with diabetic neuropathy, unspecified: Secondary | ICD-10-CM | POA: Diagnosis not present

## 2022-04-05 DIAGNOSIS — L97521 Non-pressure chronic ulcer of other part of left foot limited to breakdown of skin: Secondary | ICD-10-CM | POA: Diagnosis not present

## 2022-04-12 DIAGNOSIS — M542 Cervicalgia: Secondary | ICD-10-CM | POA: Diagnosis not present

## 2022-04-18 ENCOUNTER — Other Ambulatory Visit: Payer: Self-pay

## 2022-04-18 ENCOUNTER — Emergency Department

## 2022-04-18 ENCOUNTER — Observation Stay
Admission: EM | Admit: 2022-04-18 | Discharge: 2022-05-11 | Disposition: E | Attending: Internal Medicine | Admitting: Internal Medicine

## 2022-04-18 DIAGNOSIS — E1111 Type 2 diabetes mellitus with ketoacidosis with coma: Principal | ICD-10-CM | POA: Insufficient documentation

## 2022-04-18 DIAGNOSIS — J9602 Acute respiratory failure with hypercapnia: Secondary | ICD-10-CM | POA: Insufficient documentation

## 2022-04-18 DIAGNOSIS — E872 Acidosis, unspecified: Secondary | ICD-10-CM | POA: Diagnosis not present

## 2022-04-18 DIAGNOSIS — Z7982 Long term (current) use of aspirin: Secondary | ICD-10-CM | POA: Diagnosis not present

## 2022-04-18 DIAGNOSIS — Z79899 Other long term (current) drug therapy: Secondary | ICD-10-CM | POA: Insufficient documentation

## 2022-04-18 DIAGNOSIS — N1831 Chronic kidney disease, stage 3a: Secondary | ICD-10-CM | POA: Diagnosis not present

## 2022-04-18 DIAGNOSIS — R4182 Altered mental status, unspecified: Secondary | ICD-10-CM | POA: Diagnosis not present

## 2022-04-18 DIAGNOSIS — Z87891 Personal history of nicotine dependence: Secondary | ICD-10-CM | POA: Diagnosis not present

## 2022-04-18 DIAGNOSIS — Z7902 Long term (current) use of antithrombotics/antiplatelets: Secondary | ICD-10-CM | POA: Insufficient documentation

## 2022-04-18 DIAGNOSIS — I2510 Atherosclerotic heart disease of native coronary artery without angina pectoris: Secondary | ICD-10-CM | POA: Diagnosis not present

## 2022-04-18 DIAGNOSIS — Z794 Long term (current) use of insulin: Secondary | ICD-10-CM | POA: Diagnosis not present

## 2022-04-18 DIAGNOSIS — R059 Cough, unspecified: Secondary | ICD-10-CM | POA: Diagnosis not present

## 2022-04-18 DIAGNOSIS — R579 Shock, unspecified: Secondary | ICD-10-CM | POA: Insufficient documentation

## 2022-04-18 DIAGNOSIS — A419 Sepsis, unspecified organism: Secondary | ICD-10-CM | POA: Diagnosis not present

## 2022-04-18 DIAGNOSIS — I130 Hypertensive heart and chronic kidney disease with heart failure and stage 1 through stage 4 chronic kidney disease, or unspecified chronic kidney disease: Secondary | ICD-10-CM | POA: Insufficient documentation

## 2022-04-18 DIAGNOSIS — R000 Tachycardia, unspecified: Secondary | ICD-10-CM | POA: Insufficient documentation

## 2022-04-18 DIAGNOSIS — Z515 Encounter for palliative care: Secondary | ICD-10-CM

## 2022-04-18 DIAGNOSIS — I214 Non-ST elevation (NSTEMI) myocardial infarction: Secondary | ICD-10-CM | POA: Diagnosis present

## 2022-04-18 DIAGNOSIS — I5032 Chronic diastolic (congestive) heart failure: Secondary | ICD-10-CM | POA: Diagnosis not present

## 2022-04-18 DIAGNOSIS — E039 Hypothyroidism, unspecified: Secondary | ICD-10-CM | POA: Diagnosis not present

## 2022-04-18 DIAGNOSIS — N170 Acute kidney failure with tubular necrosis: Secondary | ICD-10-CM

## 2022-04-18 DIAGNOSIS — I959 Hypotension, unspecified: Secondary | ICD-10-CM | POA: Diagnosis not present

## 2022-04-18 DIAGNOSIS — G9341 Metabolic encephalopathy: Secondary | ICD-10-CM | POA: Diagnosis not present

## 2022-04-18 DIAGNOSIS — Z1152 Encounter for screening for COVID-19: Secondary | ICD-10-CM | POA: Insufficient documentation

## 2022-04-18 DIAGNOSIS — R0902 Hypoxemia: Secondary | ICD-10-CM | POA: Diagnosis not present

## 2022-04-18 DIAGNOSIS — N179 Acute kidney failure, unspecified: Secondary | ICD-10-CM | POA: Diagnosis not present

## 2022-04-18 DIAGNOSIS — R404 Transient alteration of awareness: Secondary | ICD-10-CM | POA: Diagnosis not present

## 2022-04-18 DIAGNOSIS — E1122 Type 2 diabetes mellitus with diabetic chronic kidney disease: Secondary | ICD-10-CM | POA: Diagnosis not present

## 2022-04-18 DIAGNOSIS — J9601 Acute respiratory failure with hypoxia: Secondary | ICD-10-CM | POA: Insufficient documentation

## 2022-04-18 DIAGNOSIS — W19XXXA Unspecified fall, initial encounter: Secondary | ICD-10-CM | POA: Diagnosis not present

## 2022-04-18 DIAGNOSIS — R739 Hyperglycemia, unspecified: Secondary | ICD-10-CM | POA: Diagnosis not present

## 2022-04-18 DIAGNOSIS — Z950 Presence of cardiac pacemaker: Secondary | ICD-10-CM | POA: Insufficient documentation

## 2022-04-18 LAB — APTT: aPTT: 34 seconds (ref 24–36)

## 2022-04-18 LAB — CBC WITH DIFFERENTIAL/PLATELET
Abs Immature Granulocytes: 0.67 10*3/uL — ABNORMAL HIGH (ref 0.00–0.07)
Basophils Absolute: 0.1 10*3/uL (ref 0.0–0.1)
Basophils Relative: 1 %
Eosinophils Absolute: 0 10*3/uL (ref 0.0–0.5)
Eosinophils Relative: 0 %
HCT: 34.7 % — ABNORMAL LOW (ref 39.0–52.0)
Hemoglobin: 9.8 g/dL — ABNORMAL LOW (ref 13.0–17.0)
Immature Granulocytes: 3 %
Lymphocytes Relative: 9 %
Lymphs Abs: 1.9 10*3/uL (ref 0.7–4.0)
MCH: 29.6 pg (ref 26.0–34.0)
MCHC: 28.2 g/dL — ABNORMAL LOW (ref 30.0–36.0)
MCV: 104.8 fL — ABNORMAL HIGH (ref 80.0–100.0)
Monocytes Absolute: 1.1 10*3/uL — ABNORMAL HIGH (ref 0.1–1.0)
Monocytes Relative: 6 %
Neutro Abs: 16.9 10*3/uL — ABNORMAL HIGH (ref 1.7–7.7)
Neutrophils Relative %: 81 %
Platelets: 180 10*3/uL (ref 150–400)
RBC: 3.31 MIL/uL — ABNORMAL LOW (ref 4.22–5.81)
RDW: 14.6 % (ref 11.5–15.5)
WBC: 20.7 10*3/uL — ABNORMAL HIGH (ref 4.0–10.5)
nRBC: 0 % (ref 0.0–0.2)

## 2022-04-18 LAB — COMPREHENSIVE METABOLIC PANEL
ALT: 20 U/L (ref 0–44)
AST: 65 U/L — ABNORMAL HIGH (ref 15–41)
Albumin: 3.4 g/dL — ABNORMAL LOW (ref 3.5–5.0)
Alkaline Phosphatase: 86 U/L (ref 38–126)
Anion gap: 31 — ABNORMAL HIGH (ref 5–15)
BUN: 56 mg/dL — ABNORMAL HIGH (ref 8–23)
CO2: 8 mmol/L — ABNORMAL LOW (ref 22–32)
Calcium: 8.3 mg/dL — ABNORMAL LOW (ref 8.9–10.3)
Chloride: 89 mmol/L — ABNORMAL LOW (ref 98–111)
Creatinine, Ser: 3.03 mg/dL — ABNORMAL HIGH (ref 0.61–1.24)
GFR, Estimated: 19 mL/min — ABNORMAL LOW (ref >60)
Glucose, Bld: 1196 mg/dL (ref 70–99)
Potassium: 5.6 mmol/L — ABNORMAL HIGH (ref 3.5–5.1)
Sodium: 128 mmol/L — ABNORMAL LOW (ref 135–145)
Total Bilirubin: 2.1 mg/dL — ABNORMAL HIGH (ref 0.3–1.2)
Total Protein: 5.8 g/dL — ABNORMAL LOW (ref 6.5–8.1)

## 2022-04-18 LAB — RESP PANEL BY RT-PCR (RSV, FLU A&B, COVID)  RVPGX2
Influenza A by PCR: NEGATIVE
Influenza B by PCR: NEGATIVE
Resp Syncytial Virus by PCR: NEGATIVE
SARS Coronavirus 2 by RT PCR: NEGATIVE

## 2022-04-18 LAB — BLOOD GAS, VENOUS
Acid-base deficit: 22.5 mmol/L — ABNORMAL HIGH (ref 0.0–2.0)
Bicarbonate: 6.8 mmol/L — ABNORMAL LOW (ref 20.0–28.0)
O2 Saturation: 76.9 %
Patient temperature: 37
pCO2, Ven: 25 mmHg — ABNORMAL LOW (ref 44–60)
pH, Ven: 7.04 — CL (ref 7.25–7.43)
pO2, Ven: 50 mmHg — ABNORMAL HIGH (ref 32–45)

## 2022-04-18 LAB — TROPONIN I (HIGH SENSITIVITY): Troponin I (High Sensitivity): 11180 ng/L (ref <18)

## 2022-04-18 LAB — PROTIME-INR
INR: 1.3 — ABNORMAL HIGH (ref 0.8–1.2)
Prothrombin Time: 15.6 seconds — ABNORMAL HIGH (ref 11.4–15.2)

## 2022-04-18 LAB — LACTIC ACID, PLASMA: Lactic Acid, Venous: 8.7 mmol/L (ref 0.5–1.9)

## 2022-04-18 MED ORDER — MORPHINE SULFATE (PF) 2 MG/ML IV SOLN: 1.0000 mg | INTRAVENOUS | Status: DC | PRN

## 2022-04-18 MED ORDER — HALOPERIDOL LACTATE 5 MG/ML IJ SOLN: 0.5000 mg | INTRAMUSCULAR | Status: DC | PRN

## 2022-04-18 MED ORDER — GLYCOPYRROLATE 0.2 MG/ML IJ SOLN: 0.2000 mg | INTRAMUSCULAR | Status: DC | PRN

## 2022-04-18 MED ORDER — HALOPERIDOL LACTATE 2 MG/ML PO CONC: 0.5000 mg | ORAL | Status: DC | PRN

## 2022-04-18 MED ORDER — SODIUM CHLORIDE 0.9 % IV SOLN
2.0000 g | Freq: Once | INTRAVENOUS | Status: DC
  Filled 2022-04-18: qty 20

## 2022-04-18 MED ORDER — GLYCOPYRROLATE 1 MG PO TABS: 1.0000 mg | ORAL_TABLET | ORAL | Status: DC | PRN

## 2022-04-18 MED ORDER — VANCOMYCIN HCL IN DEXTROSE 1-5 GM/200ML-% IV SOLN
1000.0000 mg | Freq: Once | INTRAVENOUS | Status: DC
  Filled 2022-04-18: qty 200

## 2022-04-18 MED ORDER — LORAZEPAM 2 MG/ML PO CONC: 0.5000 mg | ORAL | Status: DC | PRN

## 2022-04-18 MED ORDER — NOREPINEPHRINE 4 MG/250ML-% IV SOLN: 0.0000 ug/min | INTRAVENOUS | Status: DC

## 2022-04-18 MED ORDER — LACTATED RINGERS IV BOLUS (SEPSIS)
1000.0000 mL | Freq: Once | INTRAVENOUS | Status: DC
  Administered 2022-04-18: 1000 mL via INTRAVENOUS

## 2022-04-18 MED ORDER — LORAZEPAM 2 MG/ML PO CONC: 1.0000 mg | ORAL | Status: DC | PRN

## 2022-04-18 MED ORDER — HALOPERIDOL LACTATE 5 MG/ML IJ SOLN
2.0000 mg | Freq: Once | INTRAMUSCULAR | Status: AC
  Administered 2022-04-18: 2 mg via INTRAVENOUS

## 2022-04-18 MED ORDER — MORPHINE SULFATE (PF) 2 MG/ML IV SOLN
2.0000 mg | Freq: Once | INTRAVENOUS | Status: AC
  Administered 2022-04-18: 2 mg via INTRAVENOUS
  Filled 2022-04-18: qty 1

## 2022-04-18 MED ORDER — ONDANSETRON HCL 4 MG/2ML IJ SOLN: 4.0000 mg | Freq: Four times a day (QID) | INTRAMUSCULAR | Status: DC | PRN

## 2022-04-18 MED ORDER — LACTATED RINGERS IV BOLUS (SEPSIS): 250.0000 mL | Freq: Once | INTRAVENOUS | Status: DC

## 2022-04-18 MED ORDER — NOREPINEPHRINE 4 MG/250ML-% IV SOLN
INTRAVENOUS | Status: AC
  Administered 2022-04-18: 15 ug/min via INTRAVENOUS
  Administered 2022-04-18: 10 ug/min
  Filled 2022-04-18: qty 250

## 2022-04-18 MED ORDER — ONDANSETRON 4 MG PO TBDP: 4.0000 mg | ORAL_TABLET | Freq: Four times a day (QID) | ORAL | Status: DC | PRN

## 2022-04-18 MED ORDER — ACETAMINOPHEN 325 MG PO TABS: 650.0000 mg | ORAL_TABLET | Freq: Four times a day (QID) | ORAL | Status: DC | PRN

## 2022-04-18 MED ORDER — PIPERACILLIN-TAZOBACTAM 3.375 G IVPB 30 MIN
3.3750 g | Freq: Once | INTRAVENOUS | Status: DC
  Administered 2022-04-18: 3.375 g via INTRAVENOUS
  Filled 2022-04-18: qty 50

## 2022-04-18 MED ORDER — LACTATED RINGERS IV BOLUS (SEPSIS)
1000.0000 mL | Freq: Once | INTRAVENOUS | Status: AC
  Administered 2022-04-18: 1000 mL via INTRAVENOUS

## 2022-04-18 MED ORDER — VASOPRESSIN 20 UNITS/100 ML INFUSION FOR SHOCK
0.0000 [IU]/min | INTRAVENOUS | Status: DC
  Administered 2022-04-18: 0.03 [IU]/min via INTRAVENOUS
  Filled 2022-04-18: qty 100

## 2022-04-18 MED ORDER — ACETAMINOPHEN 650 MG RE SUPP: 650.0000 mg | Freq: Four times a day (QID) | RECTAL | Status: DC | PRN

## 2022-04-18 MED ORDER — HALOPERIDOL 0.5 MG PO TABS: 0.5000 mg | ORAL_TABLET | ORAL | Status: DC | PRN

## 2022-04-18 MED ORDER — POLYVINYL ALCOHOL 1.4 % OP SOLN: 1.0000 [drp] | Freq: Four times a day (QID) | OPHTHALMIC | Status: DC | PRN

## 2022-04-18 MED ORDER — HALOPERIDOL LACTATE 5 MG/ML IJ SOLN
INTRAMUSCULAR | Status: AC
  Filled 2022-04-18: qty 1

## 2022-04-18 MED ORDER — LORAZEPAM 1 MG PO TABS: 1.0000 mg | ORAL_TABLET | ORAL | Status: DC | PRN

## 2022-04-18 MED ORDER — BIOTENE DRY MOUTH MT LIQD: 15.0000 mL | OROMUCOSAL | Status: DC | PRN

## 2022-04-18 MED ORDER — LACTATED RINGERS IV SOLN: INTRAVENOUS | Status: DC

## 2022-04-18 MED ORDER — MORPHINE SULFATE (PF) 2 MG/ML IV SOLN
2.0000 mg | INTRAVENOUS | Status: DC | PRN
  Administered 2022-04-18 (×2): 2 mg via INTRAVENOUS
  Filled 2022-04-18 (×2): qty 1

## 2022-04-19 NOTE — Progress Notes (Incomplete)
Comfort care patient expired at 07-06-2304. Time of death pronounced by Earlie Server, RN and Irish Lack, RN. Attending, Damita Dunnings MD, has been notified. Tiffany Baker at [employer] has been contacted.

## 2022-04-23 LAB — CULTURE, BLOOD (ROUTINE X 2)
Culture: NO GROWTH
Culture: NO GROWTH
Special Requests: ADEQUATE

## 2022-05-11 NOTE — ED Notes (Signed)
Dr. Duncan at bedside 

## 2022-05-11 NOTE — ED Notes (Signed)
Provider notified of blood gas.

## 2022-05-11 NOTE — ED Notes (Signed)
ED provider notified that patients BP systolic is staying in the 80s. Provider stated that she does not want the levophed not to exceed 61mcg/min. However, provider stated she would start vasopressin. Primary RN notified.

## 2022-05-11 NOTE — Consult Note (Signed)
NAME:  Logan Strong, MRN:  [id number], DOB:  [birth year], LOS: 0 ADMISSION DATE:  2022-04-25 CONSULTATION DATE:  04-25-2022   CHIEF COMPLAINT:  resp distress and shock   History of Present Illness:  89 y.o. male presents to the emergency department with altered mental status.   Patient was found down by his granddaughter.   With EMS found to have a significantly elevated glucose level. 1200  Patient arrived with a DNR/DNI form in place.     Daughter arrived stated he was in his normal state of health yesterday.  He is DNR/DNI.  Would not want a central line.  Does state that he would want IV fluids and antibiotics.     Findings suggest severe acidosis with severe acute renal filure and using accessory muscles to breathe with severe NSTEMI       Interim History / Subjective:  Daughter RN at bedside Explained that patient is in the dying process Patient is at end of life     Objective   Blood pressure (!) 90/54, pulse (!) 55, temperature (!) 92.1 F (33.4 C), temperature source Rectal, resp. rate (!) 22, SpO2 (!) 84 %.       No intake or output data in the 24 hours ending Apr 25, 2022 1418 There were no vitals filed for this visit.   REVIEW OF SYSTEMS  PATIENT IS UNABLE TO PROVIDE COMPLETE REVIEW OF SYSTEMS DUE TO SEVERE CRITICAL ILLNESS   PHYSICAL EXAMINATION:  GENERAL:critically ill appearing, +resp distress EYES: Pupils equal, round, reactive to light.  No scleral icterus.  MOUTH: Moist mucosal membrane. NECK: Supple.  PULMONARY: Lungs clear to auscultation, +rhonchi, +wheezing CARDIOVASCULAR: S1 and S2.  Regular rate and rhythm GASTROINTESTINAL: Soft, nontender, -distended. Positive bowel sounds.  MUSCULOSKELETAL:+foot ulcer NEUROLOGIC: obtunded SKIN:normal, warm to touch, Capillary refill delayed  Pulses present bilaterally   Labs/imaging that I havepersonally reviewed  (right click and "Reselect all SmartList Selections" daily)     ASSESSMENT AND  PLAN SYNOPSIS  89 yo white male with severe metabolic encephalopathy due to septic and cardiogenic shock with acute NSTEMI with acute renal; failure with severe DKA and severe malnutrition  Severe ACUTE Hypoxic and Hypercapnic Respiratory Failure SHOCK-pressors for now CARDIAC FAILURE-NSTEMI RENAL FAILURE DKA NEUROLOGY Acute  metabolic encephalopathy  Patient is confirmed DNR/DNI status Patient would NOT want CVL and would NOT Want tubes and machines to stay alive   At this time, patient with severe multiorgan failure and at this time, patient is the dying  process and will not survive this admission.  Daughter updated and relayed to her that patient is suffering and suffocating  Will give morphine as needed  Patient to be made comfort care when family arrives Patient will not be admitted.      Best practice (right click and "Reselect all SmartList Selections" daily)  Diet: NPO Code Status:  DNR/DNI Disposition:ICU  Labs   CBC: Recent Labs  Lab 04/25/2022 1259  WBC 20.7*  NEUTROABS 16.9*  HGB 9.8*  HCT 34.7*  MCV 104.8*  PLT 664    Basic Metabolic Panel: Recent Labs  Lab 2022-04-25 1259  NA 128*  K 5.6*  CL 89*  CO2 8*  GLUCOSE 1,196*  BUN 56*  CREATININE 3.03*  CALCIUM 8.3*   GFR: CrCl cannot be calculated (Unknown ideal weight.). Recent Labs  Lab 25-Apr-2022 1259  WBC 20.7*  LATICACIDVEN 8.7*    Liver Function Tests: Recent Labs  Lab 04/25/2022 1259  AST 65*  ALT 20  ALKPHOS  86  BILITOT 2.1*  PROT 5.8*  ALBUMIN 3.4*   No results for input(s): "LIPASE", "AMYLASE" in the last 168 hours. No results for input(s): "AMMONIA" in the last 168 hours.  ABG    Component Value Date/Time   HCO3 6.8 (L) 04-21-2022 1307   ACIDBASEDEF 22.5 (H) 2022-04-21 1307   O2SAT 76.9 04-21-2022 1307     Coagulation Profile: Recent Labs  Lab April 21, 2022 1259  INR 1.3*    Cardiac Enzymes: No results for input(s): "CKTOTAL", "CKMB", "CKMBINDEX", "TROPONINI"  in the last 168 hours.  HbA1C: Hgb A1c MFr Bld  Date/Time Value Ref Range Status  02/11/2022 08:46 AM 7.7 (H) 4.8 - 5.6 % Final    Comment:    (NOTE)         Prediabetes: 5.7 - 6.4         Diabetes: >6.4         Glycemic control for adults with diabetes: <7.0   06/19/2019 01:06 PM 6.9 (H) 4.8 - 5.6 % Final    Comment:    (NOTE)         Prediabetes: 5.7 - 6.4         Diabetes: >6.4         Glycemic control for adults with diabetes: <7.0     CBG: No results for input(s): "GLUCAP" in the last 168 hours.   Past Medical History:  He,  has a past medical history of Actinic keratosis, Anemia, Chronic kidney disease, Coronary artery disease, Diabetes mellitus, type 2 ([place]), Dysrhythmia, Glaucoma, Hypothyroidism, Osteoporosis, Pneumonia, Sleep apnea, and Wears dentures.   Surgical History:   Past Surgical History:  Procedure Laterality Date   APPENDECTOMY     BACK SURGERY     lumbar   CARDIAC CATHETERIZATION  2006   stent x1   CATARACT EXTRACTION W/PHACO Right 12/19/2016   Procedure: CATARACT EXTRACTION PHACO AND INTRAOCULAR LENS PLACEMENT ([place]) RIGHT DIABETIC;  Surgeon: Leandrew Koyanagi, MD;  Location: [place];  Service: Ophthalmology;  Laterality: Right;   HERNIA REPAIR     PACEMAKER INSERTION N/A 06/13/2017   Procedure: INSERTION PACEMAKER INITIAL DUAL CHAMBER INSERTION;  Surgeon: Isaias Cowman, MD;  Location: [facility];  Service: Cardiovascular;  Laterality: N/A;   TRABECULECTOMY Right 12/19/2016   Procedure: TRABECULECTOMY WITH [facility] AND EXPRESS SHUNT;  Surgeon: Leandrew Koyanagi, MD;  Location: [place];  Service: Ophthalmology;  Laterality: Right;  Diabetic - insulin pump sleep apnea     Social History:   reports that he quit smoking about 53 years ago. He has never used smokeless tobacco. He reports that he does not drink alcohol and does not use drugs.   Family History:  His family history includes CAD in his brother; Heart disease in  his mother.   Allergies No Known Allergies   Home Medications  Prior to Admission medications   Medication Sig Start Date End Date Taking? Authorizing Provider  alendronate (FOSAMAX) 35 MG tablet Take 35 mg by mouth once a week.    [provider]  aspirin 81 MG tablet Take 81 mg by mouth at bedtime.     [provider]  brimonidine (ALPHAGAN) 0.2 % ophthalmic solution Place 1 drop into both eyes in the morning and at bedtime. 04/29/19   [provider]  CALCIUM CARBONATE-VITAMIN D PO Take 1 tablet by mouth at bedtime.     [provider]  citalopram (CELEXA) 20 MG tablet Take 20 mg by mouth daily. 09/21/21 09/21/22  [provider]  clopidogrel (PLAVIX) 75 MG tablet Take 1 tablet (75 mg total) by mouth daily. 02/14/22   Max Sane, MD  cyanocobalamin (VITAMIN B12) 1000 MCG/ML injection Inject 1,000 mcg into the muscle once.    [provider]  diclofenac Sodium (VOLTAREN) 1 % GEL Apply 2 g topically 4 (four) times daily. 08/28/21   [provider]  dorzolamide (TRUSOPT) 2 % ophthalmic solution Place 1 drop into the right eye 2 (two) times daily.     [provider]  Glucagon (GVOKE HYPOPEN 2-PACK) 1 MG/0.2ML SOAJ Inject 1 mg into the skin as needed. 02/16/22   Wouk, Ailene Rud, MD  insulin aspart (NOVOLOG) 100 UNIT/ML injection 2 units with meals plus sliding scale 02/16/22   Wouk, Ailene Rud, MD  insulin glargine (LANTUS) 100 UNIT/ML injection 10 units nightly 02/16/22 02/16/23  Wouk, Ailene Rud, MD  Insulin Syringes, Disposable, U-100 0.3 ML MISC 100 each by Does not apply route as directed. 02/16/22   Wouk, Ailene Rud, MD  latanoprost (XALATAN) 0.005 % ophthalmic solution Place 1 drop into the right eye at bedtime.     [provider]  levothyroxine (SYNTHROID) 75 MCG tablet Take 75 mcg by mouth daily before breakfast.    [provider]  lisinopril (PRINIVIL,ZESTRIL) 5 MG tablet Take 5 mg by mouth at  bedtime.     [provider]  Omega-3 Fatty Acids (FISH OIL PO) Take 1 capsule by mouth daily.     [provider]  timolol (TIMOPTIC) 0.5 % ophthalmic solution Place 1 drop into both eyes daily. 12/19/21   [provider]  traZODone (DESYREL) 50 MG tablet Take 50 mg by mouth at bedtime.    [provider]       DVT/GI PRX  assessed I Assessed the need for Labs I Assessed the need for Foley I Assessed the need for Central Venous Line Family Discussion when available I Assessed the need for Mobilization I made an Assessment of medications to be adjusted accordingly Safety Risk assessment completed  CASE DISCUSSED IN MULTIDISCIPLINARY ROUNDS WITH ICU TEAM     Critical Care Time devoted to patient care services described in this note is 75 minutes.   Critical care was necessary to treat /prevent imminent and life-threatening deterioration.   PATIENT WITH VERY POOR PROGNOSIS PATIENT WITH MULTIORGAN FAILURE AND IN THE DYING PROCESS    Raybon Conard Patricia Pesa, M.D.  [facility]  Medical Director [place] Director [facility] Cardio-Pulmonary Department

## 2022-05-11 NOTE — Progress Notes (Signed)
Elink following sepsis bundle. °

## 2022-05-11 NOTE — Progress Notes (Signed)
CODE SEPSIS - PHARMACY COMMUNICATION  **Broad Spectrum Antibiotics should be administered within 1 hour of Sepsis diagnosis**  Time Code Sepsis Called/Page Received: 1304  Antibiotics Ordered: Zosyn + vancomycin  Time of 1st antibiotic administration: 1402  Additional action taken by pharmacy: N/A  Benita Gutter 05/18/2022  1:15 PM

## 2022-05-11 NOTE — H&P (Signed)
History and Physical    Patient: Logan Strong:[id number] DOB: 1931/11/19 DOA: 2022-05-09 DOS: the patient was seen and examined on 05/09/2022 PCP: Tracie Harrier, MD  Patient coming from: Home  Chief Complaint:  Chief Complaint  Patient presents with   Fall    HPI: Logan Strong is a 89 y.o. male with medical history significant for insulin-dependent diabetes mellitus with complications of stage III chronic kidney disease, coronary artery disease status post recent NSTEMI treated medically, hypothyroidism, history of sick sinus syndrome status post permanent pacemaker placement, chronic diastolic dysfunction CHF with last known LVEF of 45 to 50%, hospitalized in December with DKA secondary to insulin pump failure, who was brought by EMS after he was found on the ground unresponsive by his granddaughter.  He was in his usual state of health the day prior.  On arrival of EMS he was noted to have a very elevated blood sugar. Workup most consistent with DKA and NSTEMI and shock.  Patient already was under the care of hospice and had a DNR/DNI form in place.   ICU consult  was requested and patient was seen by Dr. Mortimer Fries who spoke with family and confirm the wishes and advised him that patient was actively dying. No treatment was instituted at the time expecting that patient would soon pass.  After several hours in the emergency room however patient continued to hang on.  Family stated that they did not feel, trouble taking care of patient at home.  Hospice who had previously been consulted earlier in the day was contacted and they stated that they would be unable to set up hospice given the evening hours.  Hospitalist thus consulted for admission with plans for inpatient hospice if patient survives the night. Patient is currently comfort care.     Past Medical History:  Diagnosis Date   Actinic keratosis    Anemia    Chronic kidney disease    "functioning at 50%"   Coronary artery  disease    Diabetes mellitus, type 2 ([place])    Dysrhythmia    Glaucoma    Hypothyroidism    Osteoporosis    Pneumonia    Sleep apnea    could not tolerate CPAP   Wears dentures    partial upper and lower   Past Surgical History:  Procedure Laterality Date   APPENDECTOMY     BACK SURGERY     lumbar   CARDIAC CATHETERIZATION  2006   stent x1   CATARACT EXTRACTION W/PHACO Right 12/19/2016   Procedure: CATARACT EXTRACTION PHACO AND INTRAOCULAR LENS PLACEMENT ([place]) RIGHT DIABETIC;  Surgeon: Leandrew Koyanagi, MD;  Location: [place];  Service: Ophthalmology;  Laterality: Right;   HERNIA REPAIR     PACEMAKER INSERTION N/A 06/13/2017   Procedure: INSERTION PACEMAKER INITIAL DUAL CHAMBER INSERTION;  Surgeon: Isaias Cowman, MD;  Location: [facility];  Service: Cardiovascular;  Laterality: N/A;   TRABECULECTOMY Right 12/19/2016   Procedure: TRABECULECTOMY WITH [facility] AND EXPRESS SHUNT;  Surgeon: Leandrew Koyanagi, MD;  Location: [place];  Service: Ophthalmology;  Laterality: Right;  Diabetic - insulin pump sleep apnea   Social History:  reports that he quit smoking about 53 years ago. He has never used smokeless tobacco. He reports that he does not drink alcohol and does not use drugs.  No Known Allergies  Family History  Problem Relation Age of Onset   Heart disease Mother    CAD Brother     Prior to Admission medications  Medication Sig Start Date End Date Taking? Authorizing Provider  alendronate (FOSAMAX) 35 MG tablet Take 35 mg by mouth once a week.    [provider]  aspirin 81 MG tablet Take 81 mg by mouth at bedtime.     [provider]  brimonidine (ALPHAGAN) 0.2 % ophthalmic solution Place 1 drop into both eyes in the morning and at bedtime. 04/29/19   [provider]  CALCIUM CARBONATE-VITAMIN D PO Take 1 tablet by mouth at bedtime.     [provider]  citalopram (CELEXA) 20 MG tablet Take 20 mg by mouth  daily. 09/21/21 09/21/22  [provider]  clopidogrel (PLAVIX) 75 MG tablet Take 1 tablet (75 mg total) by mouth daily. 02/14/22   Max Sane, MD  cyanocobalamin (VITAMIN B12) 1000 MCG/ML injection Inject 1,000 mcg into the muscle once.    [provider]  diclofenac Sodium (VOLTAREN) 1 % GEL Apply 2 g topically 4 (four) times daily. 08/28/21   [provider]  dorzolamide (TRUSOPT) 2 % ophthalmic solution Place 1 drop into the right eye 2 (two) times daily.     [provider]  Glucagon (GVOKE HYPOPEN 2-PACK) 1 MG/0.2ML SOAJ Inject 1 mg into the skin as needed. 02/16/22   Wouk, Ailene Rud, MD  insulin aspart (NOVOLOG) 100 UNIT/ML injection 2 units with meals plus sliding scale 02/16/22   Wouk, Ailene Rud, MD  insulin glargine (LANTUS) 100 UNIT/ML injection 10 units nightly 02/16/22 02/16/23  Wouk, Ailene Rud, MD  Insulin Syringes, Disposable, U-100 0.3 ML MISC 100 each by Does not apply route as directed. 02/16/22   Wouk, Ailene Rud, MD  latanoprost (XALATAN) 0.005 % ophthalmic solution Place 1 drop into the right eye at bedtime.     [provider]  levothyroxine (SYNTHROID) 75 MCG tablet Take 75 mcg by mouth daily before breakfast.    [provider]  lisinopril (PRINIVIL,ZESTRIL) 5 MG tablet Take 5 mg by mouth at bedtime.     [provider]  Omega-3 Fatty Acids (FISH OIL PO) Take 1 capsule by mouth daily.     [provider]  timolol (TIMOPTIC) 0.5 % ophthalmic solution Place 1 drop into both eyes daily. 12/19/21   [provider]  traZODone (DESYREL) 50 MG tablet Take 50 mg by mouth at bedtime.    [provider]    Physical Exam: Vitals:   04/22/22 1700 04/22/22 1712 04-22-22 1825 Apr 22, 2022 1945  BP: (!) 50/38  (!) 82/56   Pulse: 80 80 81 81  Resp:   18   Temp:      TempSrc:      SpO2: (!) 82% (!) 86% 92% 93%   Physical Exam Constitutional:      Comments: Unresponsive does not appear to be in  distress.  Several family members at bedside     Labs on Admission: I have personally reviewed following labs and imaging studies  CBC: Recent Labs  Lab Apr 22, 2022 1259  WBC 20.7*  NEUTROABS 16.9*  HGB 9.8*  HCT 34.7*  MCV 104.8*  PLT 993   Basic Metabolic Panel: Recent Labs  Lab 04-22-2022 1259  NA 128*  K 5.6*  CL 89*  CO2 8*  GLUCOSE 1,196*  BUN 56*  CREATININE 3.03*  CALCIUM 8.3*   GFR: CrCl cannot be calculated (Unknown ideal weight.). Liver Function Tests: Recent Labs  Lab 04-22-2022 1259  AST 65*  ALT 20  ALKPHOS 86  BILITOT 2.1*  PROT 5.8*  ALBUMIN  3.4*   No results for input(s): "LIPASE", "AMYLASE" in the last 168 hours. No results for input(s): "AMMONIA" in the last 168 hours. Coagulation Profile: Recent Labs  Lab May 18, 2022 1259  INR 1.3*   Cardiac Enzymes: No results for input(s): "CKTOTAL", "CKMB", "CKMBINDEX", "TROPONINI" in the last 168 hours. BNP (last 3 results) No results for input(s): "PROBNP" in the last 8760 hours. HbA1C: No results for input(s): "HGBA1C" in the last 72 hours. CBG: No results for input(s): "GLUCAP" in the last 168 hours. Lipid Profile: No results for input(s): "CHOL", "HDL", "LDLCALC", "TRIG", "CHOLHDL", "LDLDIRECT" in the last 72 hours. Thyroid Function Tests: No results for input(s): "TSH", "T4TOTAL", "FREET4", "T3FREE", "THYROIDAB" in the last 72 hours. Anemia Panel: No results for input(s): "VITAMINB12", "FOLATE", "FERRITIN", "TIBC", "IRON", "RETICCTPCT" in the last 72 hours. Urine analysis:    Component Value Date/Time   COLORURINE STRAW (A) 02/11/2022 1050   APPEARANCEUR CLEAR (A) 02/11/2022 1050   LABSPEC 1.023 02/11/2022 1050   PHURINE 5.0 02/11/2022 1050   GLUCOSEU >=500 (A) 02/11/2022 1050   HGBUR NEGATIVE 02/11/2022 1050   BILIRUBINUR NEGATIVE 02/11/2022 1050   KETONESUR 20 (A) 02/11/2022 1050   PROTEINUR NEGATIVE 02/11/2022 1050   NITRITE NEGATIVE 02/11/2022 1050   LEUKOCYTESUR NEGATIVE 02/11/2022  1050    Radiological Exams on Admission: DG Chest Port 1 View  Result Date: 05/18/22 CLINICAL DATA:  Sepsis. EXAM: PORTABLE CHEST 1 VIEW COMPARISON:  02/14/2022 FINDINGS: Stable heart size, appearance of dual-chamber pacemaker and calcified plaque in the thoracic aorta. There is no evidence of pulmonary edema, consolidation, pneumothorax or pleural fluid. IMPRESSION: No active disease. Electronically Signed   By: Aletta Edouard M.D.   On: 05/18/2022 13:17     Data Reviewed: Relevant notes from primary care and specialist visits, past discharge summaries as available in EHR, including Care Everywhere. Prior diagnostic testing as pertinent to current admission diagnoses Updated medications and problem lists for reconciliation ED course, including vitals, labs, imaging, treatment and response to treatment Triage notes, nursing and pharmacy notes and ED provider's notes Notable results as noted in HPI   Assessment and Plan:  Comfort care measures -Comfort care measures per order set  Diabetic ketoacidosis with coma Acute renal failure superimposed on stage IIIa CKD NSTEMI Circulatory shock Unresponsiveness/acute metabolic encephalopathy CAD Ischemic cardiomyopathy Pacemaker Acute respiratory failure with hypoxia and hypercapnia    Advance Care Planning:   Code Status: DNR   Family Communication: Family members at bedside  Disposition Plan: Back to previous home environment    Author: Athena Masse, MD 05-18-2022 8:56 PM  For on call review [email or web address].

## 2022-05-11 NOTE — Significant Event (Signed)
       CROSS COVER NOTE  NAME: Logan Strong MRN: [id number] DOB : [birth year] ATTENDING PHYSICIAN: Athena Masse, MD    Notified by RN that patient has expired at 2306  Patient was DNR/comfort care  2 RN verified.   This document was prepared using Dragon voice recognition software and may include unintentional dictation errors.  Neomia Glass DNP, MBA, FNP-BC Nurse Practitioner [facility] Pager [phone]

## 2022-05-11 NOTE — Progress Notes (Signed)
Viral symptoms of cough concerning for influenza, COVID or RSV

## 2022-05-11 NOTE — Progress Notes (Signed)
[facility] ED 5 [facility] Nurse Liaison Note  Mr. Logan Strong is a current hospice home care patient followed for CAD who was found on the floor unresponsive by his Granddaughter this morning.  She activated EMS and then called [facility].  Patient is a DNR.  ED problems include:  DKA with BS 1200, NSTEMI, acute hypoxic & hypercapnic respiratory failure and renal failure.    VS: T: 92.1 (rectal)  BP 90/54, R-55, R-22 Oxi 84% Blood Gas: pH: 7.04, pCO2 25, pO2 50, Bicarb 6.8, O2 sat 76.9, acid base deficit 22.5  Patient with multiorgan failure and is actively dying.  No plans for admission and patient will be a ED death.  Patient is not stable for transport to the [place] at this time.  Patient is receiving morphine 2-4mg  IV every 1 hour prn for respiratory distress.  Levophed gtt 45mcg/ml infusing- started in the ED upon arrival and family wanted to keep gtt going at this time.  Visit made at the bedside to offer support.  Multiple family members at the bedside, daughters, brothers, grandchildren and spouses.  Gave patient's daughter my card to call if any needs during this time.  Will continue to follow through final disposition.  Dimas Aguas, RN [phone]

## 2022-05-11 NOTE — ED Notes (Signed)
Provider notified of lactic

## 2022-05-11 NOTE — Death Summary Note (Signed)
DEATH SUMMARY   Patient Details  Name: Logan Strong MRN: [id number] DOB: [birth year] ALP:[id number], Cherlyn Labella, MD Admission/Discharge Information   Admit Date:  05/13/2022  Date of Death: Date of Death: 05/13/2022  Time of Death: Time of Death: 07-07-2304  Length of Stay: 0   Principle Cause of death: Diabetic ketoacidosis with coma  Hospital Diagnoses: Principal Problem:   Comfort measures only status Active Problems:   Diabetic ketoacidosis with coma ([place])   Acute renal failure superimposed on stage 3a chronic kidney disease ([place])   NSTEMI (non-ST elevated myocardial infarction) ([place])   Shock circulatory ([place])   Acute metabolic encephalopathy   Coronary artery disease   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 male with medical history significant for insulin-dependent diabetes mellitus with complications of stage III chronic kidney disease, coronary artery disease status post recent NSTEMI treated medically, hypothyroidism, history of sick sinus syndrome status post permanent pacemaker placement, chronic diastolic dysfunction CHF with last known LVEF of 45 to 50%, hospitalized in December with DKA secondary to insulin pump failure, who was brought by EMS after he was found on the ground unresponsive by his granddaughter.  He was in his usual state of health the day prior.  On arrival of EMS he was noted to have a very elevated blood sugar. Workup most consistent with DKA and NSTEMI and shock.  Patient already was under the care of hospice and had a DNR/DNI form in place.   ICU consult  was requested and patient was seen by Dr. Mortimer Fries who spoke with family and confirm the wishes and advised him that patient was actively dying. No treatment was instituted at the time expecting that patient would soon pass.  After several hours in the emergency room  however patient continued to hang on.  Family stated that they did not feel, trouble taking care of patient at home.  Hospice who had previously been consulted earlier in the day was contacted and they stated that they would be unable to set up hospice given the evening hours.  Hospitalist thus consulted for admission with plans for inpatient hospice if patient survives the night. Patient was admitted on comfort care with plans for inpatient hospice if he survives the night however he expired a few hours after admission.   The results of significant diagnostics from this hospitalization (including imaging, microbiology, ancillary and laboratory) are listed below for reference.   Significant Diagnostic Studies: DG Chest Port 1 View  Result Date: 05/13/2022 CLINICAL DATA:  Sepsis. EXAM: PORTABLE CHEST 1 VIEW COMPARISON:  02/14/2022 FINDINGS: Stable heart size, appearance of dual-chamber pacemaker and calcified plaque in the thoracic aorta. There is no evidence of pulmonary edema, consolidation, pneumothorax or pleural fluid. IMPRESSION: No active disease. Electronically Signed   By: Aletta Edouard M.D.   On: May 13, 2022 13:17    Microbiology: Recent Results (from the past 240 hour(s))  Resp panel by RT-PCR (RSV, Flu A&B, Covid) Anterior Nasal Swab     Status: None   Collection Time: 2022-05-13  1:07 PM   Specimen: Anterior Nasal Swab  Result Value Ref Range Status   SARS Coronavirus 2 by RT PCR NEGATIVE NEGATIVE Final    Comment: (NOTE) SARS-CoV-2 target nucleic acids are NOT DETECTED.  The SARS-CoV-2 RNA is generally detectable in upper respiratory specimens during the acute phase of infection. The lowest concentration  of SARS-CoV-2 viral copies this assay can detect is 138 copies/mL. A negative result does not preclude SARS-Cov-2 infection and should not be used as the sole basis for treatment or other patient management decisions. A negative result may occur with  improper specimen  collection/handling, submission of specimen other than nasopharyngeal swab, presence of viral mutation(s) within the areas targeted by this assay, and inadequate number of viral copies(<138 copies/mL). A negative result must be combined with clinical observations, patient history, and epidemiological information. The expected result is Negative.  Fact Sheet for Patients:  EntrepreneurPulse.com.au  Fact Sheet for Healthcare Providers:  IncredibleEmployment.be  This test is no t yet approved or cleared by the [place] FDA and  has been authorized for detection and/or diagnosis of SARS-CoV-2 by FDA under an Emergency Use Authorization (EUA). This EUA will remain  in effect (meaning this test can be used) for the duration of the COVID-19 declaration under Section 564(b)(1) of the Act, 21 U.S.C.section 360bbb-3(b)(1), unless the authorization is terminated  or revoked sooner.       Influenza A by PCR NEGATIVE NEGATIVE Final   Influenza B by PCR NEGATIVE NEGATIVE Final    Comment: (NOTE) The Xpert Xpress SARS-CoV-2/FLU/RSV plus assay is intended as an aid in the diagnosis of influenza from Nasopharyngeal swab specimens and should not be used as a sole basis for treatment. Nasal washings and aspirates are unacceptable for Xpert Xpress SARS-CoV-2/FLU/RSV testing.  Fact Sheet for Patients: EntrepreneurPulse.com.au  Fact Sheet for Healthcare Providers: IncredibleEmployment.be  This test is not yet approved or cleared by the [place] FDA and has been authorized for detection and/or diagnosis of SARS-CoV-2 by FDA under an Emergency Use Authorization (EUA). This EUA will remain in effect (meaning this test can be used) for the duration of the COVID-19 declaration under Section 564(b)(1) of the Act, 21 U.S.C. section 360bbb-3(b)(1), unless the authorization is terminated or revoked.     Resp Syncytial  Virus by PCR NEGATIVE NEGATIVE Final    Comment: (NOTE) Fact Sheet for Patients: EntrepreneurPulse.com.au  Fact Sheet for Healthcare Providers: IncredibleEmployment.be  This test is not yet approved or cleared by the [place] FDA and has been authorized for detection and/or diagnosis of SARS-CoV-2 by FDA under an Emergency Use Authorization (EUA). This EUA will remain in effect (meaning this test can be used) for the duration of the COVID-19 declaration under Section 564(b)(1) of the Act, 21 U.S.C. section 360bbb-3(b)(1), unless the authorization is terminated or revoked.  Performed at [facility], [address], [place], [place] [address]     Time spent: 32 minutes  Signed: Athena Masse, MD 04/28/22

## 2022-05-11 NOTE — ED Triage Notes (Signed)
Pt arrived by [facility] after being found in floor by daughter.  States he was awake and talking 20 minutes prior.  EMS BP 88/30, HR 110.  Pt has a pacemaker and DNR.

## 2022-05-11 NOTE — ED Provider Notes (Signed)
[facility] Provider Note    Event Date/Time   First MD Initiated Contact with Patient 05-01-2022 1259     (approximate)   History   Fall   HPI  Logan Strong is a 89 y.o. male presents to the emergency department with altered mental status.  Patient was found down by his granddaughter.  With EMS found to have a significantly elevated glucose level.  Patient arrived with a DNR form in place.  Patient's daughter coming up to the hospital.  Daughter arrived stated he was in his normal state of health yesterday.  He is DNR/DNI.  Would not want a central line.  Does state that he would want IV fluids and antibiotics.  She is not currently ready to make him comfort care only.  Expressed concern of him surviving this hospitalization.  Reached out to [place].     Physical Exam   Triage Vital Signs: ED Triage Vitals  Enc Vitals Group     BP      Pulse      Resp      Temp      Temp src      SpO2      Weight      Height      Head Circumference      Peak Flow      Pain Score      Pain Loc      Pain Edu?      Excl. in [place]?     Most recent vital signs: Vitals:   2022-05-01 1405 2022-05-01 1410  BP: (!) 81/63 (!) 90/54  Pulse: (!) 106 (!) 55  Resp: (!) 22 (!) 22  Temp:    SpO2: 94% (!) 84%    Physical Exam Constitutional:      General: He is in acute distress.     Appearance: He is well-developed. He is ill-appearing.  Eyes:     Conjunctiva/sclera: Conjunctivae normal.  Cardiovascular:     Rate and Rhythm: Tachycardia present.  Pulmonary:     Comments: 2 small respirations Musculoskeletal:     Cervical back: Normal range of motion.  Skin:    General: Skin is warm.  Neurological:     Mental Status: He is alert. Mental status is at baseline.     IMPRESSION / MDM / ASSESSMENT AND PLAN / ED COURSE  I reviewed the triage vital signs and the nursing notes.  Concerning for multiple abnormalities including sepsis, dehydration,  electrolyte abnormalities, ACS, DKA, HHS  RADIOLOGY I independently reviewed imaging, my interpretation of imaging: Chest x-ray with no acute findings.  Read as no acute findings.  LABS (all labs ordered are listed, but only abnormal results are displayed) Labs interpreted as -   Meets criteria for DKA/HHS with significantly elevated glucose in the 1000's.  Troponin significantly elevated at 11,000.  Multiple abnormalities including a lactic acid that significantly elevated at 8.7.  pH of 7  Labs Reviewed  LACTIC ACID, PLASMA - Abnormal; Notable for the following components:      Result Value   Lactic Acid, Venous 8.7 (*)    All other components within normal limits  COMPREHENSIVE METABOLIC PANEL - Abnormal; Notable for the following components:   Sodium 128 (*)    Potassium 5.6 (*)    Chloride 89 (*)    CO2 8 (*)    Glucose, Bld 1,196 (*)    BUN 56 (*)    Creatinine, Ser 3.03 (*)  Calcium 8.3 (*)    Total Protein 5.8 (*)    Albumin 3.4 (*)    AST 65 (*)    Total Bilirubin 2.1 (*)    GFR, Estimated 19 (*)    Anion gap 31 (*)    All other components within normal limits  CBC WITH DIFFERENTIAL/PLATELET - Abnormal; Notable for the following components:   WBC 20.7 (*)    RBC 3.31 (*)    Hemoglobin 9.8 (*)    HCT 34.7 (*)    MCV 104.8 (*)    MCHC 28.2 (*)    Neutro Abs 16.9 (*)    Monocytes Absolute 1.1 (*)    Abs Immature Granulocytes 0.67 (*)    All other components within normal limits  PROTIME-INR - Abnormal; Notable for the following components:   Prothrombin Time 15.6 (*)    INR 1.3 (*)    All other components within normal limits  BLOOD GAS, VENOUS - Abnormal; Notable for the following components:   pH, Ven 7.04 (*)    pCO2, Ven 25 (*)    pO2, Ven 50 (*)    Bicarbonate 6.8 (*)    Acid-base deficit 22.5 (*)    All other components within normal limits  TROPONIN I (HIGH SENSITIVITY) - Abnormal; Notable for the following components:   Troponin I (High  Sensitivity) 11,180 (*)    All other components within normal limits  RESP PANEL BY RT-PCR (RSV, FLU A&B, COVID)  RVPGX2  CULTURE, BLOOD (ROUTINE X 2)  CULTURE, BLOOD (ROUTINE X 2)  APTT  LACTIC ACID, PLASMA  URINALYSIS, W/ REFLEX TO CULTURE (INFECTION SUSPECTED)  BETA-HYDROXYBUTYRIC ACID    TREATMENT  30 cc/kg of IV fluids, broad-spectrum antibiotics, peripheral Levophed, vasopressin  MDM    Multiple goals of care conversation with family members.  Initially wanted everything done except for central lines, intubation or CPR.  Consulted and discussed with hospice team and with the ICU team Dr. Mortimer Fries, after further conversation with family members ultimately decided to make the patient comfort care measures only.  Will discontinue all IV fluids, pressors and continue in the emergency department, multiple family members will be arriving.  Comfort order set placed.   PROCEDURES:  Critical Care performed: yes  .Critical Care  Performed by: Nathaniel Man, MD Authorized by: Nathaniel Man, MD   Critical care provider statement:    Critical care time (minutes):  80   Critical care time was exclusive of:  Separately billable procedures and treating other patients   Critical care was necessary to treat or prevent imminent or life-threatening deterioration of the following conditions:  Circulatory failure   Critical care was time spent personally by me on the following activities:  Development of treatment plan with patient or surrogate, discussions with consultants, evaluation of patient's response to treatment, examination of patient, ordering and review of laboratory studies, ordering and review of radiographic studies, ordering and performing treatments and interventions, pulse oximetry, re-evaluation of patient's condition and review of old charts   Patient's presentation is most consistent with acute presentation with potential threat to life or bodily function.   MEDICATIONS  ORDERED IN ED: Medications  lactated ringers infusion (has no administration in time range)  lactated ringers bolus 1,000 mL (1,000 mLs Intravenous New Bag/Given 14-May-2022 1315)    And  lactated ringers bolus 1,000 mL (has no administration in time range)    And  lactated ringers bolus 250 mL (has no administration in time range)  haloperidol lactate (HALDOL) 5  MG/ML injection (has no administration in time range)  norepinephrine (LEVOPHED) 4mg  in 229mL (0.016 mg/mL) premix infusion (15 mcg/min Intravenous Rate/Dose Change Apr 30, 2022 1339)  vasopressin (PITRESSIN) 20 Units in sodium chloride 0.9 % 100 mL infusion-*FOR SHOCK* (0.03 Units/min Intravenous New Bag/Given 04-30-2022 1407)  piperacillin-tazobactam (ZOSYN) IVPB 3.375 g (3.375 g Intravenous New Bag/Given 04/30/22 1402)  vancomycin (VANCOCIN) IVPB 1000 mg/200 mL premix (has no administration in time range)  norepinephrine (LEVOPHED) 4-5 MG/250ML-% infusion SOLN (10 mcg/min  Rate/Dose Change 30-Apr-2022 1323)  haloperidol lactate (HALDOL) injection 2 mg (2 mg Intravenous Given Apr 30, 2022 1327)    FINAL CLINICAL IMPRESSION(S) / ED DIAGNOSES   Final diagnoses:  Shock ([place])     Rx / DC Orders   ED Discharge Orders     None        Note:  This document was prepared using Dragon voice recognition software and may include unintentional dictation errors.   Nathaniel Man, MD April 30, 2022 [phone]

## 2022-05-11 NOTE — ED Notes (Signed)
ED Provider at bedside. 

## 2022-05-11 NOTE — IPAL (Signed)
  Interdisciplinary Goals of Care Family Meeting   Date carried out: 2022/05/08  Location of the meeting: Bedside  Member's involved: Physician and Family Member or next of kin  Durable Power of Attorney or acting medical decision maker: Several family members including power of attorney Carmine Savoy  Discussion: We discussed goals of care for Logan Strong .   I have reviewed medical records including EPIC notes, labs and imaging, assessed the patient and then met with family to discuss major active diagnoses, plan of care, natural trajectory, prognosis, GOC, EOL wishes, disposition and options including Full code/DNI/DNR and the concept of comfort care if DNR is elected. Questions and concerns were addressed. They are in agreement with DNR and comfort care  Code status:   Code Status: DNR   Disposition: In-patient comfort care  Time spent for the meeting: [place], MD  May 08, 2022, 11:35 PM

## 2022-05-11 DEATH — deceased

## 2022-06-27 ENCOUNTER — Ambulatory Visit: Payer: Medicare HMO | Admitting: Dermatology
